# Patient Record
Sex: Female | Born: 1960 | Race: Black or African American | Hispanic: No | Marital: Single | Smoking: Never smoker
Health system: Southern US, Community
[De-identification: ages and names within clinical notes are randomized; demographics above are authoritative.]

## PROBLEM LIST (undated history)

## (undated) DIAGNOSIS — T7840XA Allergy, unspecified, initial encounter: Secondary | ICD-10-CM

## (undated) DIAGNOSIS — D693 Immune thrombocytopenic purpura: Secondary | ICD-10-CM

## (undated) DIAGNOSIS — I10 Essential (primary) hypertension: Secondary | ICD-10-CM

## (undated) DIAGNOSIS — E049 Nontoxic goiter, unspecified: Secondary | ICD-10-CM

## (undated) DIAGNOSIS — E119 Type 2 diabetes mellitus without complications: Secondary | ICD-10-CM

## (undated) DIAGNOSIS — E039 Hypothyroidism, unspecified: Secondary | ICD-10-CM

## (undated) HISTORY — DX: Allergy, unspecified, initial encounter: T78.40XA

## (undated) HISTORY — PX: OTHER SURGICAL HISTORY: SHX169

## (undated) HISTORY — DX: Nontoxic goiter, unspecified: E04.9

## (undated) HISTORY — DX: Essential (primary) hypertension: I10

## (undated) HISTORY — DX: Type 2 diabetes mellitus without complications: E11.9

## (undated) HISTORY — DX: Immune thrombocytopenic purpura: D69.3

---

## 2001-05-23 ENCOUNTER — Other Ambulatory Visit: Admission: RE | Admit: 2001-05-23 | Discharge: 2001-05-23 | Payer: Self-pay | Admitting: Obstetrics and Gynecology

## 2001-09-09 ENCOUNTER — Encounter: Payer: Self-pay | Admitting: Family Medicine

## 2001-09-09 ENCOUNTER — Encounter: Admission: RE | Admit: 2001-09-09 | Discharge: 2001-09-09 | Payer: Self-pay | Admitting: Family Medicine

## 2001-10-15 ENCOUNTER — Ambulatory Visit (HOSPITAL_COMMUNITY): Admission: RE | Admit: 2001-10-15 | Discharge: 2001-10-15 | Payer: Self-pay | Admitting: Family Medicine

## 2001-10-15 ENCOUNTER — Encounter: Payer: Self-pay | Admitting: Family Medicine

## 2001-10-24 ENCOUNTER — Encounter: Payer: Self-pay | Admitting: Family Medicine

## 2001-10-24 ENCOUNTER — Encounter (INDEPENDENT_AMBULATORY_CARE_PROVIDER_SITE_OTHER): Payer: Self-pay | Admitting: Specialist

## 2001-10-24 ENCOUNTER — Encounter (INDEPENDENT_AMBULATORY_CARE_PROVIDER_SITE_OTHER): Payer: Self-pay | Admitting: *Deleted

## 2001-10-24 ENCOUNTER — Ambulatory Visit (HOSPITAL_COMMUNITY): Admission: RE | Admit: 2001-10-24 | Discharge: 2001-10-24 | Payer: Self-pay | Admitting: Family Medicine

## 2002-06-27 ENCOUNTER — Other Ambulatory Visit: Admission: RE | Admit: 2002-06-27 | Discharge: 2002-06-27 | Payer: Self-pay | Admitting: Obstetrics and Gynecology

## 2003-07-28 ENCOUNTER — Other Ambulatory Visit: Admission: RE | Admit: 2003-07-28 | Discharge: 2003-07-28 | Payer: Self-pay | Admitting: Obstetrics and Gynecology

## 2003-09-17 ENCOUNTER — Encounter: Admission: RE | Admit: 2003-09-17 | Discharge: 2003-09-17 | Payer: Self-pay | Admitting: Family Medicine

## 2005-10-26 ENCOUNTER — Encounter: Admission: RE | Admit: 2005-10-26 | Discharge: 2005-10-26 | Payer: Self-pay | Admitting: Rheumatology

## 2005-11-10 ENCOUNTER — Ambulatory Visit: Payer: Self-pay | Admitting: Family Medicine

## 2005-11-24 ENCOUNTER — Ambulatory Visit: Payer: Self-pay | Admitting: Family Medicine

## 2006-01-16 ENCOUNTER — Ambulatory Visit: Payer: Self-pay | Admitting: Family Medicine

## 2006-04-16 ENCOUNTER — Ambulatory Visit: Payer: Self-pay | Admitting: Family Medicine

## 2006-04-16 ENCOUNTER — Encounter: Payer: Self-pay | Admitting: Family Medicine

## 2006-04-16 ENCOUNTER — Other Ambulatory Visit: Admission: RE | Admit: 2006-04-16 | Discharge: 2006-04-16 | Payer: Self-pay | Admitting: Family Medicine

## 2006-06-19 ENCOUNTER — Ambulatory Visit: Payer: Self-pay | Admitting: Family Medicine

## 2007-04-17 ENCOUNTER — Ambulatory Visit: Payer: Self-pay | Admitting: Family Medicine

## 2007-04-17 LAB — CONVERTED CEMR LAB
ALT: 24 units/L (ref 0–35)
Alkaline Phosphatase: 73 units/L (ref 39–117)
BUN: 9 mg/dL (ref 6–23)
Calcium: 9.4 mg/dL (ref 8.4–10.5)
Cholesterol: 148 mg/dL (ref 0–200)
Eosinophils Absolute: 0.2 10*3/uL (ref 0.0–0.6)
GFR calc Af Amer: 77 mL/min
GFR calc non Af Amer: 63 mL/min
HDL: 24.5 mg/dL — ABNORMAL LOW (ref >39.0)
Ketones, urine, test strip: NEGATIVE
Lymphocytes Relative: 39.4 % (ref 12.0–46.0)
MCV: 76.7 fL — ABNORMAL LOW (ref 78.0–100.0)
Monocytes Relative: 8.6 % (ref 3.0–11.0)
Neutro Abs: 2.9 10*3/uL (ref 1.4–7.7)
Platelets: 290 10*3/uL (ref 150–400)
Specific Gravity, Urine: >=1.03
Urobilinogen, UA: 0.2
VLDL: 42 mg/dL — ABNORMAL HIGH (ref 0–40)

## 2007-04-24 ENCOUNTER — Encounter: Payer: Self-pay | Admitting: Family Medicine

## 2007-04-24 ENCOUNTER — Other Ambulatory Visit: Admission: RE | Admit: 2007-04-24 | Discharge: 2007-04-24 | Payer: Self-pay | Admitting: Family Medicine

## 2007-04-24 ENCOUNTER — Ambulatory Visit: Payer: Self-pay | Admitting: Family Medicine

## 2007-04-24 DIAGNOSIS — J309 Allergic rhinitis, unspecified: Secondary | ICD-10-CM | POA: Insufficient documentation

## 2007-04-24 DIAGNOSIS — E049 Nontoxic goiter, unspecified: Secondary | ICD-10-CM

## 2007-04-24 DIAGNOSIS — H209 Unspecified iridocyclitis: Secondary | ICD-10-CM | POA: Insufficient documentation

## 2007-04-24 DIAGNOSIS — E669 Obesity, unspecified: Secondary | ICD-10-CM

## 2007-04-24 DIAGNOSIS — Q828 Other specified congenital malformations of skin: Secondary | ICD-10-CM

## 2007-04-26 ENCOUNTER — Encounter: Admission: RE | Admit: 2007-04-26 | Discharge: 2007-04-26 | Payer: Self-pay | Admitting: Family Medicine

## 2007-04-29 ENCOUNTER — Telehealth (INDEPENDENT_AMBULATORY_CARE_PROVIDER_SITE_OTHER): Payer: Self-pay | Admitting: *Deleted

## 2007-05-03 ENCOUNTER — Encounter: Admission: RE | Admit: 2007-05-03 | Discharge: 2007-05-03 | Payer: Self-pay | Admitting: Family Medicine

## 2007-05-03 ENCOUNTER — Other Ambulatory Visit: Admission: RE | Admit: 2007-05-03 | Discharge: 2007-05-03 | Payer: Self-pay | Admitting: Interventional Radiology

## 2007-05-03 ENCOUNTER — Encounter (INDEPENDENT_AMBULATORY_CARE_PROVIDER_SITE_OTHER): Payer: Self-pay | Admitting: Interventional Radiology

## 2007-05-03 ENCOUNTER — Encounter: Payer: Self-pay | Admitting: Family Medicine

## 2007-05-09 ENCOUNTER — Telehealth: Payer: Self-pay | Admitting: Family Medicine

## 2007-05-15 ENCOUNTER — Telehealth (INDEPENDENT_AMBULATORY_CARE_PROVIDER_SITE_OTHER): Payer: Self-pay

## 2007-11-05 ENCOUNTER — Encounter: Admission: RE | Admit: 2007-11-05 | Discharge: 2007-11-05 | Payer: Self-pay | Admitting: Family Medicine

## 2007-11-13 ENCOUNTER — Encounter: Admission: RE | Admit: 2007-11-13 | Discharge: 2007-11-13 | Payer: Self-pay | Admitting: Family Medicine

## 2008-04-15 ENCOUNTER — Ambulatory Visit: Payer: Self-pay | Admitting: Family Medicine

## 2008-04-15 LAB — CONVERTED CEMR LAB
Albumin: 3.6 g/dL (ref 3.5–5.2)
Basophils Absolute: 0.1 10*3/uL (ref 0.0–0.1)
Basophils Relative: 0.7 % (ref 0.0–3.0)
Bilirubin Urine: NEGATIVE
Calcium: 9.1 mg/dL (ref 8.4–10.5)
Chloride: 106 meq/L (ref 96–112)
Cholesterol: 164 mg/dL (ref 0–200)
Creatinine, Ser: 1 mg/dL (ref 0.4–1.2)
Eosinophils Absolute: 0.1 10*3/uL (ref 0.0–0.7)
GFR calc Af Amer: 76 mL/min
GFR calc non Af Amer: 63 mL/min
Glucose, Urine, Semiquant: NEGATIVE
HCT: 41.4 % (ref 36.0–46.0)
MCHC: 32.6 g/dL (ref 30.0–36.0)
MCV: 75.2 fL — ABNORMAL LOW (ref 78.0–100.0)
Monocytes Absolute: 0.5 10*3/uL (ref 0.1–1.0)
Neutro Abs: 3.9 10*3/uL (ref 1.4–7.7)
Neutrophils Relative %: 53.6 % (ref 43.0–77.0)
Protein, U semiquant: NEGATIVE
RBC: 5.51 M/uL — ABNORMAL HIGH (ref 3.87–5.11)
TSH: 0.8 microintl units/mL (ref 0.35–5.50)
Total Bilirubin: 0.7 mg/dL (ref 0.3–1.2)
VLDL: 21 mg/dL (ref 0–40)
pH: 5.5

## 2008-04-27 ENCOUNTER — Other Ambulatory Visit: Admission: RE | Admit: 2008-04-27 | Discharge: 2008-04-27 | Payer: Self-pay | Admitting: Family Medicine

## 2008-04-27 ENCOUNTER — Encounter: Payer: Self-pay | Admitting: Family Medicine

## 2008-04-27 ENCOUNTER — Ambulatory Visit: Payer: Self-pay | Admitting: Family Medicine

## 2008-04-27 DIAGNOSIS — I10 Essential (primary) hypertension: Secondary | ICD-10-CM

## 2008-04-30 ENCOUNTER — Telehealth: Payer: Self-pay | Admitting: Family Medicine

## 2008-05-14 ENCOUNTER — Telehealth: Payer: Self-pay | Admitting: *Deleted

## 2008-06-01 ENCOUNTER — Encounter: Admission: RE | Admit: 2008-06-01 | Discharge: 2008-06-01 | Payer: Self-pay | Admitting: Family Medicine

## 2008-06-02 ENCOUNTER — Ambulatory Visit: Payer: Self-pay | Admitting: Family Medicine

## 2008-06-02 DIAGNOSIS — J069 Acute upper respiratory infection, unspecified: Secondary | ICD-10-CM | POA: Insufficient documentation

## 2008-06-02 DIAGNOSIS — J45909 Unspecified asthma, uncomplicated: Secondary | ICD-10-CM | POA: Insufficient documentation

## 2008-12-03 ENCOUNTER — Encounter: Admission: RE | Admit: 2008-12-03 | Discharge: 2008-12-03 | Payer: Self-pay | Admitting: Family Medicine

## 2009-04-22 ENCOUNTER — Ambulatory Visit: Payer: Self-pay | Admitting: Family Medicine

## 2009-04-22 LAB — CONVERTED CEMR LAB
Albumin: 3.6 g/dL (ref 3.5–5.2)
Alkaline Phosphatase: 53 units/L (ref 39–117)
Basophils Absolute: 0 10*3/uL (ref 0.0–0.1)
Basophils Relative: 0.4 % (ref 0.0–3.0)
Bilirubin Urine: NEGATIVE
CO2: 30 meq/L (ref 19–32)
Calcium: 9.3 mg/dL (ref 8.4–10.5)
Chloride: 109 meq/L (ref 96–112)
Creatinine, Ser: 0.9 mg/dL (ref 0.4–1.2)
Eosinophils Absolute: 0.1 10*3/uL (ref 0.0–0.7)
Glucose, Bld: 118 mg/dL — ABNORMAL HIGH (ref 70–99)
Glucose, Urine, Semiquant: NEGATIVE
HDL: 45 mg/dL (ref >39.00)
Hemoglobin: 13.3 g/dL (ref 12.0–15.0)
Ketones, urine, test strip: NEGATIVE
Lymphocytes Relative: 39.2 % (ref 12.0–46.0)
MCHC: 32.9 g/dL (ref 30.0–36.0)
MCV: 75 fL — ABNORMAL LOW (ref 78.0–100.0)
Monocytes Absolute: 0.5 10*3/uL (ref 0.1–1.0)
Neutro Abs: 3.7 10*3/uL (ref 1.4–7.7)
Neutrophils Relative %: 52.2 % (ref 43.0–77.0)
RDW: 14 % (ref 11.5–14.6)
Specific Gravity, Urine: 1.02
TSH: 0.61 microintl units/mL (ref 0.35–5.50)
Total Protein: 7.1 g/dL (ref 6.0–8.3)
Triglycerides: 106 mg/dL (ref 0.0–149.0)
pH: 5

## 2009-04-29 ENCOUNTER — Encounter: Payer: Self-pay | Admitting: Family Medicine

## 2009-04-30 ENCOUNTER — Ambulatory Visit: Payer: Self-pay | Admitting: Family Medicine

## 2009-04-30 ENCOUNTER — Other Ambulatory Visit: Admission: RE | Admit: 2009-04-30 | Discharge: 2009-04-30 | Payer: Self-pay | Admitting: Family Medicine

## 2010-01-10 ENCOUNTER — Encounter: Admission: RE | Admit: 2010-01-10 | Discharge: 2010-01-10 | Payer: Self-pay | Admitting: Family Medicine

## 2010-01-10 LAB — HM MAMMOGRAPHY

## 2010-04-26 ENCOUNTER — Ambulatory Visit
Admission: RE | Admit: 2010-04-26 | Discharge: 2010-04-26 | Payer: Self-pay | Source: Home / Self Care | Attending: Family Medicine | Admitting: Family Medicine

## 2010-04-26 LAB — CONVERTED CEMR LAB
ALT: 22 units/L (ref 0–35)
BUN: 8 mg/dL (ref 6–23)
Bilirubin Urine: NEGATIVE
CO2: 28 meq/L (ref 19–32)
Chloride: 102 meq/L (ref 96–112)
Cholesterol: 179 mg/dL (ref 0–200)
Creatinine, Ser: 0.8 mg/dL (ref 0.4–1.2)
Eosinophils Absolute: 0.1 10*3/uL (ref 0.0–0.7)
Eosinophils Relative: 1.8 % (ref 0.0–5.0)
Glucose, Bld: 105 mg/dL — ABNORMAL HIGH (ref 70–99)
Glucose, Urine, Semiquant: NEGATIVE
HCT: 42.1 % (ref 36.0–46.0)
LDL Cholesterol: 117 mg/dL — ABNORMAL HIGH (ref 0–99)
Lymphs Abs: 2.7 10*3/uL (ref 0.7–4.0)
MCHC: 32.6 g/dL (ref 30.0–36.0)
MCV: 74.5 fL — ABNORMAL LOW (ref 78.0–100.0)
Monocytes Absolute: 0.5 10*3/uL (ref 0.1–1.0)
Neutrophils Relative %: 48.1 % (ref 43.0–77.0)
Platelets: 277 10*3/uL (ref 150.0–400.0)
Potassium: 3.9 meq/L (ref 3.5–5.1)
Specific Gravity, Urine: >=1.03
TSH: 0.38 microintl units/mL (ref 0.35–5.50)
Total Bilirubin: 0.8 mg/dL (ref 0.3–1.2)
Total Protein: 7.1 g/dL (ref 6.0–8.3)
Triglycerides: 126 mg/dL (ref 0.0–149.0)
Urobilinogen, UA: 0.2
WBC: 6.5 10*3/uL (ref 4.5–10.5)
pH: 5.5

## 2010-05-03 ENCOUNTER — Ambulatory Visit
Admission: RE | Admit: 2010-05-03 | Discharge: 2010-05-03 | Payer: Self-pay | Source: Home / Self Care | Attending: Family Medicine | Admitting: Family Medicine

## 2010-05-03 ENCOUNTER — Encounter: Payer: Self-pay | Admitting: Family Medicine

## 2010-05-15 ENCOUNTER — Encounter: Payer: Self-pay | Admitting: Family Medicine

## 2010-05-17 ENCOUNTER — Other Ambulatory Visit: Payer: Self-pay | Admitting: Family Medicine

## 2010-05-17 ENCOUNTER — Other Ambulatory Visit
Admission: RE | Admit: 2010-05-17 | Discharge: 2010-05-17 | Payer: Self-pay | Source: Home / Self Care | Admitting: Family Medicine

## 2010-05-17 ENCOUNTER — Ambulatory Visit
Admission: RE | Admit: 2010-05-17 | Discharge: 2010-05-17 | Payer: Self-pay | Source: Home / Self Care | Attending: Family Medicine | Admitting: Family Medicine

## 2010-05-17 LAB — HM PAP SMEAR

## 2010-05-24 NOTE — Assessment & Plan Note (Signed)
Summary: cpx/cjr   Vital Signs:  Patient profile:   50 year old female Menstrual status:  regular LMP:     04/15/2009 Height:      65.5 inches Weight:      187 pounds BMI:     30.76 Temp:     99.3 degrees F oral BP sitting:   120 / 84  (left arm) Cuff size:   regular  Vitals Entered By: Kern Reap CMA Duncan Dull) (April 30, 2009 8:30 AM)  Reason for Visit cpx  History of Present Illness: ruwayda is a 50 year old female, who recently moved back in with her parents, who comes in today for physical evaluation.  She has a history of underlying hypothyroidism, on Synthroid 50 micrograms daily.  TSH level normal.  She takes Tenoretic 50 -- 25 dose one half tab daily BP 120/84.  Also takes an 81-mg aspirin tablet daily.  For birth control.  She is on BCPs.  No side effects.  She gets routine eye care.  Dental care does BSE monthly and gets annual mammography.  Last year.  We noted a cystic lesion in her right breast at the 11 o'clock position.  Biopsy was negative.  She is on a q.6 month recall.  She has a history of underlying allergic rhinitis, currently quiet.  She also has a history of asthma.  She's had a cough now for 3 weeks.  She thinks it was triggered by her viral infection.  No fever no sputum production.  Tetanus booster 2003 seasonal flu shot today  Allergies: No Known Drug Allergies  Past History:  Past medical, surgical, family and social histories (including risk factors) reviewed, and no changes noted (except as noted below).  Past Medical History: Reviewed history from 04/27/2008 and no changes required. Allergic rhinitis dry skin goiter ar iritis childbirth x 1 Hypertension  Past Surgical History: Reviewed history from 04/24/2007 and no changes required. cb x 1  Family History: Reviewed history from 04/27/2008 and no changes required. Family History Hypertension  Social History: Reviewed history from 04/24/2007 and no changes  required. Occupation: Single Never Smoked Alcohol use-no Drug use-no Regular exercise-yes  Review of Systems      See HPI       Flu Vaccine Consent Questions     Do you have a history of severe allergic reactions to this vaccine? no    Any prior history of allergic reactions to egg and/or gelatin? no    Do you have a sensitivity to the preservative Thimersol? no    Do you have a past history of Guillan-Barre Syndrome? no    Do you currently have an acute febrile illness? no    Have you ever had a severe reaction to latex? no    Vaccine information given and explained to patient? yes    Are you currently pregnant? no    Lot Number:[id number]   Exp Date:10/21/2009   Site Given  Right  Deltoid IM   Physical Exam  General:  Well-developed,well-nourished,in no acute distress; alert,appropriate and cooperative throughout examination Head:  Normocephalic and atraumatic without obvious abnormalities. No apparent alopecia or balding. Eyes:  No corneal or conjunctival inflammation noted. EOMI. Perrla. Funduscopic exam benign, without hemorrhages, exudates or papilledema. Vision grossly normal. Ears:  External ear exam shows no significant lesions or deformities.  Otoscopic examination reveals clear canals, tympanic membranes are intact bilaterally without bulging, retraction, inflammation or discharge. Hearing is grossly normal bilaterally. Nose:  External nasal examination shows no deformity or  inflammation. Nasal mucosa are pink and moist without lesions or exudates. Mouth:  Oral mucosa and oropharynx without lesions or exudates.  Teeth in good repair. Neck:  No deformities, masses, or tenderness noted. Chest Wall:  No deformities, masses, or tenderness noted. Breasts:  the left breast is normal.  Right breast at the 11 o'clock position approximately 2 cm from the nipple is a soft movable cystic type lesion Lungs:  Normal respiratory effort, chest expands symmetrically. Lungs are clear to  auscultation, no crackles or wheezes. Heart:  Normal rate and regular rhythm. S1 and S2 normal without gallop, murmur, click, rub or other extra sounds. Abdomen:  Bowel sounds positive,abdomen soft and non-tender without masses, organomegaly or hernias noted. Rectal:  No external abnormalities noted. Normal sphincter tone. No rectal masses or tenderness. Genitalia:  Pelvic Exam:        External: normal female genitalia without lesions or masses        Vagina: normal without lesions or masses        Cervix: normal without lesions or masses        Adnexa: normal bimanual exam without masses or fullness        Uterus: normal by palpation        Pap smear: performed Msk:  No deformity or scoliosis noted of thoracic or lumbar spine.   Pulses:  R and L carotid,radial,femoral,dorsalis pedis and posterior tibial pulses are full and equal bilaterally Extremities:  No clubbing, cyanosis, edema, or deformity noted with normal full range of motion of all joints.   Neurologic:  No cranial nerve deficits noted. Station and gait are normal. Plantar reflexes are down-going bilaterally. DTRs are symmetrical throughout. Sensory, motor and coordinative functions appear intact. Skin:  Intact without suspicious lesions or rashes Cervical Nodes:  No lymphadenopathy noted Axillary Nodes:  No palpable lymphadenopathy Inguinal Nodes:  No significant adenopathy Psych:  Cognition and judgment appear intact. Alert and cooperative with normal attention span and concentration. No apparent delusions, illusions, hallucinations   Impression & Recommendations:  Problem # 1:  HYPERTENSION (ICD-401.9) Assessment Improved  Her updated medication list for this problem includes:    Tenoretic 50 50-25 Mg Tabs (Atenolol-chlorthalidone) .Marland Kitchen... 1/2 tab once daily  Orders: EKG w/ Interpretation (93000) Prescription Created Electronically [phone])  Problem # 2:  OBESITY (ICD-278.00) Assessment:  Unchanged  Orders: Prescription Created Electronically [phone])  Problem # 3:  GOITER, UNSPECIFIED (ICD-240.9) Assessment: Unchanged  Orders: Prescription Created Electronically [phone])  Problem # 4:  ALLERGIC RHINITIS (ICD-477.9) Assessment: Unchanged  Complete Medication List: 1)  Levothyroxine Sodium 50 Mcg Tabs (Levothyroxine sodium) .... Take 1 tablet by mouth once a day 2)  Tenoretic 50 50-25 Mg Tabs (Atenolol-chlorthalidone) .... 1/2 tab once daily 3)  Aspirin Ec 81 Mg Tbec (aspirin)  4)  Zovia 1/35e (28) 1-35 Mg-mcg Tabs (Ethynodiol diac-eth estradiol) .... Uad 5)  Metronidazole 500 Mg Tabs (Metronidazole) .... Take 1 tablet by mouth two times a day 6)  Prednisone 20 Mg Tabs (Prednisone) .... Uad 7)  Hydromet 5-1.5 Mg/58ml Syrp (Hydrocodone-homatropine) .Marland Kitchen.. 1 or 2 tsps three times a day as needed  Other Orders: Admin 1st Vaccine (91478) Flu Vaccine 3yrs + (29562)  Patient Instructions: 1)  begin prednisone, take two tabs x 3 days, one x 3 days, a half x 3 days, then half a tablet Monday, Wednesday, Friday, for a two week taper. 2)  Please schedule a follow-up appointment in 1 year. 3)  It is important that you exercise regularly at least  20 minutes 5 times a week. If you develop chest pain, have severe difficulty breathing, or feel very tired , stop exercising immediately and seek medical attention. 4)  You need to lose weight. Consider a lower calorie diet and regular exercise.  5)  Schedule your mammogram. 6)  Take an Aspirin every day. Prescriptions: PREDNISONE 20 MG TABS (PREDNISONE) UAD  #30 x 0   Entered and Authorized by:   Roderick Pee MD   Signed by:   Roderick Pee MD on 04/30/2009   Method used:   Electronically to        [facility] [address] [phone]* (retail)       [address]       [place], [place]  [address]       Ph: [phone] or [phone]       Fax: [phone]   RxID:   [id number] ZOVIA 1/35E (28) 1-35 MG-MCG TABS  (ETHYNODIOL DIAC-ETH ESTRADIOL) UAD  #3 paks x 3   Entered and Authorized by:   Roderick Pee MD   Signed by:   Roderick Pee MD on 04/30/2009   Method used:   Electronically to        [facility] [address] [phone]* (retail)       [address]       [place], [place]  [address]       Ph: [phone] or [phone]       Fax: [phone]   RxID:   [id number] TENORETIC 50 50-25 MG  TABS (ATENOLOL-CHLORTHALIDONE) 1/2 tab once daily  #50 x 4   Entered and Authorized by:   Roderick Pee MD   Signed by:   Roderick Pee MD on 04/30/2009   Method used:   Electronically to        [facility] [address] [phone]* (retail)       [address]       [place], [place]  [address]       Ph: [phone] or [phone]       Fax: [phone]   RxID:   [id number] LEVOTHYROXINE SODIUM 50 MCG  TABS (LEVOTHYROXINE SODIUM) Take 1 tablet by mouth once a day  #100 x 4   Entered and Authorized by:   Roderick Pee MD   Signed by:   Roderick Pee MD on 04/30/2009   Method used:   Electronically to        [facility] [address] [phone]* (retail)       [address]       [place], [place]  [address]       Ph: [phone] or [phone]       Fax: [phone]   RxID:   [id number]

## 2010-05-26 NOTE — Assessment & Plan Note (Signed)
Summary: cpx/pap/njr   Vital Signs:  Patient profile:   50 year old female Menstrual status:  regular Height:      65.5 inches Weight:      189 pounds BMI:     31.08 Temp:     98.5 degrees F oral BP sitting:   118 / 80  (left arm) Cuff size:   regular  Vitals Entered By: Kern Reap CMA Duncan Dull) (May 03, 2010 8:27 AM)  History of Present Illness: Autumn Acevedo is a 50 year old female, nonsmoker, who comes in today for general physical examination because of history of underlying hypertension dysfunctional uterine bleeding and a symmetrical goiter.  She takes Synthroid 50 micrograms daily for the goiter.  She says is unchanged in size.  She is asymptomatic and does not wish to pursue surgery.  She takes Tenoretic 50 -- 25 dose one half tab q.a.m., BP 118/80.  He takes her BCPs.  LMP started today.  Will get her back in two weeks for Pap.  She also takes an aspirin tablet daily.  Review of systems negative except as cough for 2 1/2 months.  She has a history of underlying allergic rhinitis and asthma.  Her back in January of 2011 we had to give her a short course of prednisone for the same problem.  She gets routine eye care, dental care, TSE monthly, mammogram in August.  Normal, tetanus, 2003, seasonal flu 2011  Allergies (verified): No Known Drug Allergies  Past History:  Past medical, surgical, family and social histories (including risk factors) reviewed, and no changes noted (except as noted below).  Past Medical History: Reviewed history from 04/27/2008 and no changes required. Allergic rhinitis dry skin goiter ar iritis childbirth x 1 Hypertension  Past Surgical History: Reviewed history from 04/24/2007 and no changes required. cb x 1  Family History: Reviewed history from 04/27/2008 and no changes required. Family History Hypertension  Social History: Reviewed history from 04/24/2007 and no changes required. Occupation: Single Never Smoked Alcohol  use-no Drug use-no Regular exercise-yes  Review of Systems      See HPI  Physical Exam  General:  Well-developed,well-nourished,in no acute distress; alert,appropriate and cooperative throughout examination Head:  Normocephalic and atraumatic without obvious abnormalities. No apparent alopecia or balding. Eyes:  No corneal or conjunctival inflammation noted. EOMI. Perrla. Funduscopic exam benign, without hemorrhages, exudates or papilledema. Vision grossly normal. Ears:  External ear exam shows no significant lesions or deformities.  Otoscopic examination reveals clear canals, tympanic membranes are intact bilaterally without bulging, retraction, inflammation or discharge. Hearing is grossly normal bilaterally. Nose:  External nasal examination shows no deformity or inflammation. Nasal mucosa are pink and moist without lesions or exudates. Mouth:  Oral mucosa and oropharynx without lesions or exudates.  Teeth in good repair. Neck:  the thyroid gland is about 4 times normal size and symmetrical Chest Wall:  No deformities, masses, or tenderness noted. Breasts:  No mass, nodules, thickening, tenderness, bulging, retraction, inflamation, nipple discharge or skin changes noted.   Lungs:  Normal respiratory effort, chest expands symmetrically. Lungs are clear to auscultation, no crackles or wheezes. Heart:  Normal rate and regular rhythm. S1 and S2 normal without gallop, murmur, click, rub or other extra sounds. Abdomen:  Bowel sounds positive,abdomen soft and non-tender without masses, organomegaly or hernias noted. Msk:  No deformity or scoliosis noted of thoracic or lumbar spine.   Pulses:  R and L carotid,radial,femoral,dorsalis pedis and posterior tibial pulses are full and equal bilaterally Extremities:  No clubbing,  cyanosis, edema, or deformity noted with normal full range of motion of all joints.   Neurologic:  No cranial nerve deficits noted. Station and gait are normal. Plantar reflexes  are down-going bilaterally. DTRs are symmetrical throughout. Sensory, motor and coordinative functions appear intact. Skin:  Intact without suspicious lesions or rashes Cervical Nodes:  No lymphadenopathy noted Axillary Nodes:  No palpable lymphadenopathy Inguinal Nodes:  No significant adenopathy Psych:  Cognition and judgment appear intact. Alert and cooperative with normal attention span and concentration. No apparent delusions, illusions, hallucinations   Impression & Recommendations:  Problem # 1:  INTRINSIC ASTHMA, UNSPECIFIED (ICD-493.10) Assessment Deteriorated  The following medications were removed from the medication list:    Prednisone 20 Mg Tabs (Prednisone) ..... Uad Her updated medication list for this problem includes:    Prednisone 20 Mg Tabs (Prednisone) ..... Uad  Orders: Prescription Created Electronically [id number])  Problem # 2:  HYPERTENSION (ICD-401.9) Assessment: Improved  Her updated medication list for this problem includes:    Tenoretic 50 50-25 Mg Tabs (Atenolol-chlorthalidone) .Marland Kitchen... 1/2 tab once daily  Orders: Prescription Created Electronically [phone]) EKG w/ Interpretation (93000)  Problem # 3:  DRY SKIN (ICD-757.39) Assessment: Deteriorated  Orders: Prescription Created Electronically [phone])  Problem # 4:  GOITER, UNSPECIFIED (ICD-240.9) Assessment: Unchanged  Orders: Prescription Created Electronically [phone])  Problem # 5:  HEALTH SCREENING (ICD-V70.0) Assessment: Unchanged  Orders: Prescription Created Electronically [phone]) EKG w/ Interpretation (93000)  Complete Medication List: 1)  Levothyroxine Sodium 50 Mcg Tabs (Levothyroxine sodium) .... Take 1 tablet by mouth once a day 2)  Tenoretic 50 50-25 Mg Tabs (Atenolol-chlorthalidone) .... 1/2 tab once daily 3)  Aspirin Ec 81 Mg Tbec (aspirin)  4)  Zovia 1/35e (28) 1-35 Mg-mcg Tabs (Ethynodiol diac-eth estradiol) .... Uad 5)  Prednisone 20 Mg Tabs (Prednisone) .... Uad  Patient  Instructions: 1)  continue current medications. 2)  Return in two weeks for a Pap smear. 3)  Begin prednisone two tabs daily until you feel well, then taper by taking one tab daily for 3 days, a half a tablet a day for 3 days, then half a tablet Monday, Wednesday, Friday, for a two week taper Prescriptions: ZOVIA 1/35E (28) 1-35 MG-MCG TABS (ETHYNODIOL DIAC-ETH ESTRADIOL) UAD  #84.0 Each x 2   Entered and Authorized by:   Roderick Pee MD   Signed by:   Roderick Pee MD on 05/03/2010   Method used:   Print then Give to Patient   RxID:   [id number] TENORETIC 50 50-25 MG  TABS (ATENOLOL-CHLORTHALIDONE) 1/2 tab once daily  #50.0 Each x 3   Entered and Authorized by:   Roderick Pee MD   Signed by:   Roderick Pee MD on 05/03/2010   Method used:   Print then Give to Patient   RxID:   [id number] LEVOTHYROXINE SODIUM 50 MCG  TABS (LEVOTHYROXINE SODIUM) Take 1 tablet by mouth once a day  #100.0 Each x 3   Entered and Authorized by:   Roderick Pee MD   Signed by:   Roderick Pee MD on 05/03/2010   Method used:   Print then Give to Patient   RxID:   [id number] ZOVIA 1/35E (28) 1-35 MG-MCG TABS (ETHYNODIOL DIAC-ETH ESTRADIOL) UAD  #84.0 Each x 2   Entered and Authorized by:   Roderick Pee MD   Signed by:   Roderick Pee MD on 05/03/2010   Method used:   Electronically to  [facility] [address] [phone]* (retail)       [address]       [place], [place]  [address]       Ph: [phone] or [phone]       Fax: [phone]   RxID:   [id number] TENORETIC 50 50-25 MG  TABS (ATENOLOL-CHLORTHALIDONE) 1/2 tab once daily  #50.0 Each x 3   Entered and Authorized by:   Roderick Pee MD   Signed by:   Roderick Pee MD on 05/03/2010   Method used:   Electronically to        [facility] [address] [phone]* (retail)       [address]       [place], [place]  [address]       Ph: [phone] or [phone]       Fax:  [phone]   RxID:   [id number] LEVOTHYROXINE SODIUM 50 MCG  TABS (LEVOTHYROXINE SODIUM) Take 1 tablet by mouth once a day  #100.0 Each x 3   Entered and Authorized by:   Roderick Pee MD   Signed by:   Roderick Pee MD on 05/03/2010   Method used:   Electronically to        [facility] [address] [phone]* (retail)       [address]       [place], [place]  [address]       Ph: [phone] or [phone]       Fax: [phone]   RxID:   [id number] PREDNISONE 20 MG TABS (PREDNISONE) UAD  #50 x 1   Entered and Authorized by:   Roderick Pee MD   Signed by:   Roderick Pee MD on 05/03/2010   Method used:   Electronically to        [facility] [address] [phone]* (retail)       [address]       [place], [place]  [address]       Ph: [phone] or [phone]       Fax: [phone]   RxID:   [id number]    Orders Added: 1)  Prescription Created Electronically [G8553] 2)  Est. Patient 40-64 years [99396] 3)  EKG w/ Interpretation [93000]

## 2010-05-26 NOTE — Assessment & Plan Note (Signed)
Summary: PAP ONLY/NO CHARGE/MM   History of Present Illness: Autumn Acevedo is a 50 year old female, who comes back today for a Pap smear.  We get her physical examination two weeks ago, however, at the time she was not at mid cycle and therefore, we brought her back for a Pap today  Allergies: No Known Drug Allergies  Physical Exam  Rectal:  No external abnormalities noted. Normal sphincter tone. No rectal masses or tenderness. Genitalia:  Pelvic Exam:        External: normal female genitalia without lesions or masses        Vagina: normal without lesions or masses        Cervix: normal without lesions or masses        Adnexa: normal bimanual exam without masses or fullness        Uterus: normal by palpation        Pap smear: performed   Impression & Recommendations:  Problem # 1:  HEALTH SCREENING (ICD-V70.0) Assessment Unchanged  Orders: No Charge Patient Arrived (NCPA0) (NCPA0)  Complete Medication List: 1)  Levothyroxine Sodium 50 Mcg Tabs (Levothyroxine sodium) .... Take 1 tablet by mouth once a day 2)  Tenoretic 50 50-25 Mg Tabs (Atenolol-chlorthalidone) .... 1/2 tab once daily 3)  Aspirin Ec 81 Mg Tbec (aspirin)  4)  Zovia 1/35e (28) 1-35 Mg-mcg Tabs (Ethynodiol diac-eth estradiol) .... Uad 5)  Prednisone 20 Mg Tabs (Prednisone) .... Uad  Patient Instructions: 1)  Please schedule a follow-up appointment as needed.   Orders Added: 1)  No Charge Patient Arrived (NCPA0) [NCPA0]

## 2010-09-13 ENCOUNTER — Encounter: Payer: Self-pay | Admitting: Internal Medicine

## 2010-09-13 ENCOUNTER — Ambulatory Visit (INDEPENDENT_AMBULATORY_CARE_PROVIDER_SITE_OTHER): Payer: Managed Care, Other (non HMO) | Admitting: Internal Medicine

## 2010-09-13 DIAGNOSIS — J40 Bronchitis, not specified as acute or chronic: Secondary | ICD-10-CM

## 2010-09-13 MED ORDER — DOXYCYCLINE HYCLATE 100 MG PO TABS: 100.0000 mg | ORAL_TABLET | Freq: Two times a day (BID) | ORAL | Status: AC

## 2010-09-13 MED ORDER — CHLORPHENIRAMINE-HYDROCODONE 8-10 MG/5ML PO LQCR: 5.0000 mL | Freq: Two times a day (BID) | ORAL | Status: DC | PRN

## 2010-09-17 ENCOUNTER — Encounter: Payer: Self-pay | Admitting: Internal Medicine

## 2010-09-17 NOTE — Progress Notes (Signed)
  Subjective:    Patient ID: Autumn Acevedo, female    DOB: 08/10/1960, 50 y.o.   MRN: [id number]  HPI Patient presents to clinic for evaluation of cough. Patient notes one and a half week history of laryngitis, cough productive for white to clear sputum worse at night. Denies nasal congestion shortness of breath wheezing fever or chills. No obvious sick exposure. No alleviating or exacerbating factors. No other complaints.  Reviewed past medical history, medications and allergies  Review of Systemssee history of present illness     Objective:   Physical Exam    Physical Exam  Vitals reviewed. Constitutional:  appears well-developed and well-nourished. No distress.  HENT:  Head: Normocephalic and atraumatic.  Right Ear: Tympanic membrane, external ear and ear canal normal.  Left Ear: Tympanic membrane, external ear and ear canal normal.  Nose: Nose normal.  Mouth/Throat: Oropharynx is clear and moist. No oropharyngeal exudate.  Eyes: Conjunctivae and EOM are normal. Pupils are equal, round, and reactive to light. Right eye exhibits no discharge. Left eye exhibits no discharge. No scleral icterus.  Neck: Neck supple. No thyromegaly present.  Cardiovascular: Normal rate, regular rhythm and normal heart sounds.  Exam reveals no gallop and no friction rub.   No murmur heard. Pulmonary/Chest: Effort normal and breath sounds normal. No respiratory distress.  has no wheezes.  has no rales.  Lymphadenopathy:   no cervical adenopathy.  Neurological:  is alert.  Skin: Skin is warm and dry.  not diaphoretic.  Psychiatric: normal mood and affect.      Assessment & Plan:

## 2010-09-17 NOTE — Assessment & Plan Note (Signed)
Begin antibiotic therapy for 10 days. Attempt Tussionex p.r.n. Cough. Caution regarding possible sedating effect.Followup if no improvement or worsening.

## 2010-11-13 ENCOUNTER — Other Ambulatory Visit: Payer: Self-pay | Admitting: Family Medicine

## 2010-11-22 ENCOUNTER — Other Ambulatory Visit: Payer: Self-pay | Admitting: *Deleted

## 2010-11-22 MED ORDER — ETHYNODIOL DIAC-ETH ESTRADIOL 1-35 MG-MCG PO TABS: 1.0000 | ORAL_TABLET | ORAL | Status: DC

## 2010-11-22 MED ORDER — ATENOLOL-CHLORTHALIDONE 50-25 MG PO TABS: ORAL_TABLET | ORAL | Status: DC

## 2010-11-30 ENCOUNTER — Encounter: Payer: Self-pay | Admitting: Family Medicine

## 2010-11-30 ENCOUNTER — Ambulatory Visit (INDEPENDENT_AMBULATORY_CARE_PROVIDER_SITE_OTHER): Payer: Managed Care, Other (non HMO) | Admitting: Family Medicine

## 2010-11-30 DIAGNOSIS — J309 Allergic rhinitis, unspecified: Secondary | ICD-10-CM

## 2010-11-30 DIAGNOSIS — J45909 Unspecified asthma, uncomplicated: Secondary | ICD-10-CM

## 2010-11-30 MED ORDER — PREDNISONE 20 MG PO TABS: ORAL_TABLET | ORAL | Status: AC

## 2010-11-30 NOTE — Progress Notes (Signed)
  Subjective:    Patient ID: Autumn Acevedo, female    DOB: 14-Sep-1960, 50 y.o.   MRN: [id number]  HPI Autumn Acevedo is a 50 year old female, married, nonsmoker comes in with a 3 month history of cough.  She has a history of underlying allergic rhinitis and asthma.  She began coughing in May and was seen here and given antibiotic, however, didn't help.  The cough has persisted.  The entire time.  She had an episode like this a couple years ago.  We gave her a short course of prednisone in the cough went away.  Environmental review of systems negative   Review of Systems General and environmental and allergic review of systems otherwise negative    Objective:   Physical Exam  Well-developed well-nourished, female, in no acute distress.  Examination of the HEENT were negative.  Neck was supple.  No adenopathy.  Thyroid is enlarged.  Per usual.  She has a history of underlying goiter.  Lungs symmetrical breath sounds expiratory wheezing bilaterally     Assessment & Plan:  Asthma plan prednisone burst and taper return

## 2010-11-30 NOTE — Patient Instructions (Signed)
Take the prednisone as directed.  Return p.r.n. 

## 2010-12-08 ENCOUNTER — Ambulatory Visit: Payer: Managed Care, Other (non HMO) | Admitting: Family Medicine

## 2011-01-23 ENCOUNTER — Other Ambulatory Visit: Payer: Self-pay | Admitting: Family Medicine

## 2011-01-23 DIAGNOSIS — Z1231 Encounter for screening mammogram for malignant neoplasm of breast: Secondary | ICD-10-CM

## 2011-01-30 ENCOUNTER — Ambulatory Visit: Payer: Managed Care, Other (non HMO)

## 2011-02-01 ENCOUNTER — Ambulatory Visit
Admission: RE | Admit: 2011-02-01 | Discharge: 2011-02-01 | Disposition: A | Discharge status: Home / Self Care | Payer: Managed Care, Other (non HMO) | Source: Ambulatory Visit | Attending: Family Medicine | Admitting: Family Medicine

## 2011-02-01 DIAGNOSIS — Z1231 Encounter for screening mammogram for malignant neoplasm of breast: Secondary | ICD-10-CM

## 2011-03-15 ENCOUNTER — Other Ambulatory Visit: Payer: Self-pay | Admitting: *Deleted

## 2011-03-15 MED ORDER — ATENOLOL-CHLORTHALIDONE 50-25 MG PO TABS: ORAL_TABLET | ORAL | Status: DC

## 2011-05-19 ENCOUNTER — Other Ambulatory Visit (INDEPENDENT_AMBULATORY_CARE_PROVIDER_SITE_OTHER): Payer: Managed Care, Other (non HMO)

## 2011-05-19 DIAGNOSIS — Z0000 Encounter for general adult medical examination without abnormal findings: Secondary | ICD-10-CM

## 2011-05-19 LAB — CBC WITH DIFFERENTIAL/PLATELET
Eosinophils Relative: 3.1 % (ref 0.0–5.0)
HCT: 41.8 % (ref 36.0–46.0)
Hemoglobin: 13.6 g/dL (ref 12.0–15.0)
Lymphs Abs: 2.7 10*3/uL (ref 0.7–4.0)
MCV: 74 fl — ABNORMAL LOW (ref 78.0–100.0)
Monocytes Absolute: 0.6 10*3/uL (ref 0.1–1.0)
Neutro Abs: 3.8 10*3/uL (ref 1.4–7.7)
Platelets: 271 10*3/uL (ref 150.0–400.0)
RDW: 14.9 % — ABNORMAL HIGH (ref 11.5–14.6)
WBC: 7.3 10*3/uL (ref 4.5–10.5)

## 2011-05-19 LAB — POCT URINALYSIS DIPSTICK
Bilirubin, UA: NEGATIVE
Glucose, UA: NEGATIVE
Ketones, UA: NEGATIVE
Leukocytes, UA: NEGATIVE
Nitrite, UA: NEGATIVE

## 2011-05-19 LAB — BASIC METABOLIC PANEL
BUN: 10 mg/dL (ref 6–23)
Chloride: 103 mEq/L (ref 96–112)
Glucose, Bld: 149 mg/dL — ABNORMAL HIGH (ref 70–99)
Potassium: 4.3 mEq/L (ref 3.5–5.1)
Sodium: 140 mEq/L (ref 135–145)

## 2011-05-19 LAB — LIPID PANEL
HDL: 39.6 mg/dL (ref >39.00)
Total CHOL/HDL Ratio: 4

## 2011-05-19 LAB — TSH: TSH: 0.33 u[IU]/mL — ABNORMAL LOW (ref 0.35–5.50)

## 2011-05-19 LAB — HEPATIC FUNCTION PANEL
ALT: 14 U/L (ref 0–35)
Albumin: 3.9 g/dL (ref 3.5–5.2)
Total Bilirubin: 0.7 mg/dL (ref 0.3–1.2)
Total Protein: 7.2 g/dL (ref 6.0–8.3)

## 2011-05-25 ENCOUNTER — Other Ambulatory Visit: Payer: Self-pay | Admitting: Family Medicine

## 2011-05-30 ENCOUNTER — Other Ambulatory Visit (HOSPITAL_COMMUNITY)
Admission: RE | Admit: 2011-05-30 | Discharge: 2011-05-30 | Disposition: A | Discharge status: Home / Self Care | Payer: Managed Care, Other (non HMO) | Source: Ambulatory Visit | Attending: Family Medicine | Admitting: Family Medicine

## 2011-05-30 ENCOUNTER — Ambulatory Visit (INDEPENDENT_AMBULATORY_CARE_PROVIDER_SITE_OTHER): Payer: Managed Care, Other (non HMO) | Admitting: Family Medicine

## 2011-05-30 ENCOUNTER — Encounter: Payer: Self-pay | Admitting: Family Medicine

## 2011-05-30 DIAGNOSIS — I10 Essential (primary) hypertension: Secondary | ICD-10-CM

## 2011-05-30 DIAGNOSIS — Z23 Encounter for immunization: Secondary | ICD-10-CM

## 2011-05-30 DIAGNOSIS — Z01419 Encounter for gynecological examination (general) (routine) without abnormal findings: Secondary | ICD-10-CM | POA: Insufficient documentation

## 2011-05-30 DIAGNOSIS — Z0000 Encounter for general adult medical examination without abnormal findings: Secondary | ICD-10-CM

## 2011-05-30 DIAGNOSIS — Q828 Other specified congenital malformations of skin: Secondary | ICD-10-CM

## 2011-05-30 DIAGNOSIS — E669 Obesity, unspecified: Secondary | ICD-10-CM

## 2011-05-30 DIAGNOSIS — E049 Nontoxic goiter, unspecified: Secondary | ICD-10-CM

## 2011-05-30 MED ORDER — ATENOLOL-CHLORTHALIDONE 50-25 MG PO TABS: ORAL_TABLET | ORAL | Status: DC

## 2011-05-30 MED ORDER — LEVOTHYROXINE SODIUM 50 MCG PO TABS: 50.0000 ug | ORAL_TABLET | Freq: Every day | ORAL | Status: DC

## 2011-05-30 NOTE — Progress Notes (Signed)
  Subjective:    Patient ID: Autumn Acevedo, female    DOB: Mar 19, 1961, 51 y.o.   MRN: [id number]  HPI Autumn Acevedo is a 51 year old single female nonsmoker who comes in today for general physical examination because of a history of hypertension and a goiter and dysfunctional uterine bleeding  Her hypertension has been treated with Tenoretic dose one half tab daily BP today 160/100 initially repeat by me after exam 150/90  She stopped her BCPs in October is beginning having normal short periods is content to stay off the BCPs. Currently not sexually active.  She has a history of a symmetrical goiter and is on suppression with Synthroid 50 mcg daily. No change in size of her thyroid and no symptoms.  Tetanus booster 2007 seasonal flu shot today   Review of Systems  Constitutional: Negative.   HENT: Negative.   Eyes: Negative.   Respiratory: Negative.   Cardiovascular: Negative.   Gastrointestinal: Negative.   Genitourinary: Negative.   Musculoskeletal: Negative.   Neurological: Negative.   Hematological: Negative.   Psychiatric/Behavioral: Negative.        Objective:   Physical Exam  Constitutional: She appears well-developed and well-nourished.  HENT:  Head: Normocephalic and atraumatic.  Right Ear: External ear normal.  Left Ear: External ear normal.  Nose: Nose normal.  Mouth/Throat: Oropharynx is clear and moist.  Eyes: EOM are normal. Pupils are equal, round, and reactive to light.  Neck: Normal range of motion. Neck supple. No thyromegaly present.  Cardiovascular: Normal rate, regular rhythm, normal heart sounds and intact distal pulses.  Exam reveals no gallop and no friction rub.   No murmur heard. Pulmonary/Chest: Effort normal and breath sounds normal.  Abdominal: Soft. Bowel sounds are normal. She exhibits no distension and no mass. There is no tenderness. There is no rebound.  Genitourinary: Vagina normal and uterus normal. Guaiac negative stool. No vaginal discharge  found.  Musculoskeletal: Normal range of motion.  Lymphadenopathy:    She has no cervical adenopathy.  Neurological: She is alert. She has normal reflexes. No cranial nerve deficit. She exhibits normal muscle tone. Coordination normal.  Skin: Skin is warm and dry.  Psychiatric: She has a normal mood and affect. Her behavior is normal. Judgment and thought content normal.   neck exam shows a diffuse goiter bilaterally nonnodular        Assessment & Plan:  Healthy female  Hypertension not at goal increase Tenoretic to 1 tablet daily BP check daily followup in 3 weeks  Dysfunctional uterine bleeding perimenopausal a dry skin and minimal hot flushes avoid BCPs  Multinodular goiter continue Synthroid suppression 50 mcg daily  Overweight diet exercise and weight loss

## 2011-05-30 NOTE — Patient Instructions (Signed)
Increase E. Tenoretic take one tablet daily and check your blood pressure daily in the morning return in 3 weeks with the data and the device  Continue the Synthroid daily  General physical examination annually

## 2011-06-20 ENCOUNTER — Ambulatory Visit (INDEPENDENT_AMBULATORY_CARE_PROVIDER_SITE_OTHER): Payer: Managed Care, Other (non HMO) | Admitting: Family Medicine

## 2011-06-20 ENCOUNTER — Encounter: Payer: Self-pay | Admitting: Family Medicine

## 2011-06-20 VITALS — BP 112/80

## 2011-06-20 DIAGNOSIS — I10 Essential (primary) hypertension: Secondary | ICD-10-CM

## 2011-06-20 NOTE — Progress Notes (Signed)
  Subjective:    Patient ID: Autumn Acevedo, female    DOB: 04-13-1961, 51 y.o.   MRN: [id number]  HPI Autumn Acevedo is a 51 year old female who comes in today for followup of hypertension  She's now on a full tablet of Tenoretic 50-25 daily BP normal 120/80 no side effects from medication   Review of Systems General and cardiovascular review of systems otherwise negative    Objective:   Physical Exam Well-developed well-nourished female in no acute distress BP right arm sitting position 120/80   Pulse 70 and regular    Assessment & Plan:  Hypertension adult continue current medication BP check weekly return when necessary or for annual exam

## 2011-06-20 NOTE — Patient Instructions (Signed)
Continue your medication daily  Check your blood pressure weekly  Return in one year sooner if any problems

## 2011-08-16 ENCOUNTER — Encounter: Payer: Self-pay | Admitting: Family

## 2011-08-16 ENCOUNTER — Ambulatory Visit (INDEPENDENT_AMBULATORY_CARE_PROVIDER_SITE_OTHER): Payer: Managed Care, Other (non HMO) | Admitting: Family

## 2011-08-16 VITALS — BP 120/84 | Temp 99.0°F | Wt 190.0 lb

## 2011-08-16 DIAGNOSIS — R05 Cough: Secondary | ICD-10-CM

## 2011-08-16 DIAGNOSIS — J209 Acute bronchitis, unspecified: Secondary | ICD-10-CM

## 2011-08-16 MED ORDER — METHYLPREDNISOLONE 4 MG PO KIT: PACK | ORAL | Status: AC

## 2011-08-16 MED ORDER — ALBUTEROL SULFATE HFA 108 (90 BASE) MCG/ACT IN AERS: 2.0000 | INHALATION_SPRAY | Freq: Four times a day (QID) | RESPIRATORY_TRACT | Status: DC | PRN

## 2011-08-16 MED ORDER — HYDROCOD POLST-CHLORPHEN POLST 10-8 MG/5ML PO LQCR: 5.0000 mL | Freq: Two times a day (BID) | ORAL | Status: DC | PRN

## 2011-08-16 NOTE — Patient Instructions (Signed)

## 2011-08-16 NOTE — Progress Notes (Signed)
  Subjective:    Patient ID: Autumn Acevedo, female    DOB: [birth year], 51 y.o.   MRN: [id number]  HPI Comments: 51 yo black female presents with c/o dry/hacking cough x one month. Nonsmoker.  Denies cp, shortness of breath, fever, chills, sorethroat, headaches, sneezing, or other associated s/s. Has not taken any OTC meds. No one in household has been sick.  Cough Pertinent negatives include no shortness of breath or wheezing.      Review of Systems  Constitutional: Negative.   HENT: Negative.   Eyes: Negative.   Respiratory: Positive for cough. Negative for apnea, choking, chest tightness, shortness of breath, wheezing and stridor.   Skin: Negative.    Past Medical History  Diagnosis Date  . Allergy   . Hypertension   . Goiter     History   Social History  . Marital Status: Single    Spouse Name: N/A    Number of Children: N/A  . Years of Education: N/A   Occupational History  . Not on file.   Social History Main Topics  . Smoking status: Never Smoker   . Smokeless tobacco: Not on file  . Alcohol Use: No  . Drug Use: No  . Sexually Active:    Other Topics Concern  . Not on file   Social History Narrative  . No narrative on file    No past surgical history on file.  Family History  Problem Relation Age of Onset  . Hypertension      fhx    No Known Allergies  Current Outpatient Prescriptions on File Prior to Visit  Medication Sig Dispense Refill  . aspirin 81 MG tablet Take 81 mg by mouth daily.        Marland Kitchen atenolol-chlorthalidone (TENORETIC) 50-25 MG per tablet 1 tablet daily in the morning  100 tablet  3  . levothyroxine (SYNTHROID, LEVOTHROID) 50 MCG tablet Take 1 tablet (50 mcg total) by mouth daily.  100 tablet  3    BP 120/84  Temp(Src) 99 F (37.2 C) (Oral)  Wt 190 lb (86.183 kg)chart    Objective:   Physical Exam  Constitutional: She is oriented to person, place, and time. She appears well-developed and well-nourished. No distress.  HENT:   Right Ear: External ear normal.  Left Ear: External ear normal.  Mouth/Throat: Oropharynx is clear and moist. No oropharyngeal exudate.  Cardiovascular: Normal rate, normal heart sounds and intact distal pulses.  Exam reveals no gallop and no friction rub.   No murmur heard. Pulmonary/Chest: Effort normal and breath sounds normal. No respiratory distress. She has no wheezes. She has no rales. She exhibits no tenderness.  Neurological: She is alert and oriented to person, place, and time.  Skin: Skin is warm and dry. She is not diaphoretic.          Assessment & Plan:  Assessment: Bronchitis, Cough  Plan: prednisone, tussinex. Albuterol. RTC if s/s do not resolve or get worse. Teaching handouts on diagnosis and treatment provided.Opportunity for questions provided.

## 2012-01-22 ENCOUNTER — Other Ambulatory Visit: Payer: Self-pay | Admitting: Family Medicine

## 2012-01-22 DIAGNOSIS — Z1231 Encounter for screening mammogram for malignant neoplasm of breast: Secondary | ICD-10-CM

## 2012-02-02 ENCOUNTER — Ambulatory Visit
Admission: RE | Admit: 2012-02-02 | Discharge: 2012-02-02 | Disposition: A | Discharge status: Home / Self Care | Payer: Managed Care, Other (non HMO) | Source: Ambulatory Visit | Attending: Family Medicine | Admitting: Family Medicine

## 2012-02-02 DIAGNOSIS — Z1231 Encounter for screening mammogram for malignant neoplasm of breast: Secondary | ICD-10-CM

## 2012-05-16 ENCOUNTER — Other Ambulatory Visit (INDEPENDENT_AMBULATORY_CARE_PROVIDER_SITE_OTHER): Payer: Managed Care, Other (non HMO)

## 2012-05-16 DIAGNOSIS — Z0000 Encounter for general adult medical examination without abnormal findings: Secondary | ICD-10-CM

## 2012-05-16 LAB — CBC WITH DIFFERENTIAL/PLATELET
Basophils Absolute: 0 10*3/uL (ref 0.0–0.1)
Eosinophils Absolute: 0.3 10*3/uL (ref 0.0–0.7)
HCT: 40.1 % (ref 36.0–46.0)
Hemoglobin: 13 g/dL (ref 12.0–15.0)
Lymphs Abs: 2 10*3/uL (ref 0.7–4.0)
MCHC: 32.4 g/dL (ref 30.0–36.0)
Monocytes Absolute: 0.5 10*3/uL (ref 0.1–1.0)
Neutro Abs: 2 10*3/uL (ref 1.4–7.7)
Platelets: 257 10*3/uL (ref 150.0–400.0)
RDW: 15.4 % — ABNORMAL HIGH (ref 11.5–14.6)

## 2012-05-16 LAB — HEPATIC FUNCTION PANEL
AST: 16 U/L (ref 0–37)
Albumin: 4 g/dL (ref 3.5–5.2)
Total Bilirubin: 0.6 mg/dL (ref 0.3–1.2)

## 2012-05-16 LAB — TSH: TSH: 0.31 u[IU]/mL — ABNORMAL LOW (ref 0.35–5.50)

## 2012-05-16 LAB — BASIC METABOLIC PANEL
BUN: 15 mg/dL (ref 6–23)
CO2: 29 mEq/L (ref 19–32)
Glucose, Bld: 203 mg/dL — ABNORMAL HIGH (ref 70–99)
Potassium: 3.9 mEq/L (ref 3.5–5.1)
Sodium: 140 mEq/L (ref 135–145)

## 2012-05-16 LAB — LIPID PANEL
Total CHOL/HDL Ratio: 5
Triglycerides: 113 mg/dL (ref 0.0–149.0)

## 2012-05-17 LAB — POCT URINALYSIS DIPSTICK
Blood, UA: NEGATIVE
Leukocytes, UA: NEGATIVE
Nitrite, UA: NEGATIVE
Urobilinogen, UA: 0.2

## 2012-05-22 ENCOUNTER — Other Ambulatory Visit: Payer: Managed Care, Other (non HMO)

## 2012-06-05 ENCOUNTER — Encounter: Payer: Managed Care, Other (non HMO) | Admitting: Family Medicine

## 2012-07-01 ENCOUNTER — Telehealth: Payer: Self-pay | Admitting: Family Medicine

## 2012-07-01 DIAGNOSIS — E049 Nontoxic goiter, unspecified: Secondary | ICD-10-CM

## 2012-07-01 MED ORDER — LEVOTHYROXINE SODIUM 50 MCG PO TABS: 50.0000 ug | ORAL_TABLET | Freq: Every day | ORAL | Status: DC

## 2012-07-01 MED ORDER — ATENOLOL-CHLORTHALIDONE 50-25 MG PO TABS: ORAL_TABLET | ORAL | Status: DC

## 2012-07-01 NOTE — Telephone Encounter (Signed)
Patient called to reschedule her cpx and her rxs have expired and she need her atenolol 50-25mg  1poqd in the morning and her synthroid 50 mcg 1poqd sent to [facility]. Please assist.

## 2012-07-01 NOTE — Telephone Encounter (Signed)
Rx sent to pharmacy   

## 2012-07-05 ENCOUNTER — Ambulatory Visit (INDEPENDENT_AMBULATORY_CARE_PROVIDER_SITE_OTHER): Payer: Managed Care, Other (non HMO) | Admitting: Family Medicine

## 2012-07-05 ENCOUNTER — Encounter: Payer: Self-pay | Admitting: Family Medicine

## 2012-07-05 VITALS — BP 104/78 | Temp 99.3°F | Wt 167.0 lb

## 2012-07-05 DIAGNOSIS — K5289 Other specified noninfective gastroenteritis and colitis: Secondary | ICD-10-CM

## 2012-07-05 DIAGNOSIS — K529 Noninfective gastroenteritis and colitis, unspecified: Secondary | ICD-10-CM

## 2012-07-05 MED ORDER — ONDANSETRON HCL 4 MG PO TABS: 4.0000 mg | ORAL_TABLET | Freq: Three times a day (TID) | ORAL | Status: DC | PRN

## 2012-07-05 NOTE — Progress Notes (Signed)
Chief Complaint  Patient presents with  . N/V/D    HPI:  Acute visit for N/V/D: -4 days ago -watery diarrhea, nausea, vomiting -hasn't vomited in 2 days, diarrhea still persistent -denies: fever, abd pain, urinary symptoms, normal urine output, blood in stools -people at work with the same symptoms  ROS: See pertinent positives and negatives per HPI.  Past Medical History  Diagnosis Date  . Allergy   . Hypertension   . Goiter     Family History  Problem Relation Age of Onset  . Hypertension      fhx    History   Social History  . Marital Status: Single    Spouse Name: N/A    Number of Children: N/A  . Years of Education: N/A   Social History Main Topics  . Smoking status: Never Smoker   . Smokeless tobacco: None  . Alcohol Use: No  . Drug Use: No  . Sexually Active:    Other Topics Concern  . None   Social History Narrative  . None    Current outpatient prescriptions:albuterol (PROVENTIL HFA;VENTOLIN HFA) 108 (90 BASE) MCG/ACT inhaler, Inhale 2 puffs into the lungs every 6 (six) hours as needed for wheezing., Disp: 1 Inhaler, Rfl: 2;  aspirin 81 MG tablet, Take 81 mg by mouth daily.  , Disp: , Rfl: ;  atenolol-chlorthalidone (TENORETIC) 50-25 MG per tablet, 1 tablet daily in the morning, Disp: 100 tablet, Rfl: 3 chlorpheniramine-HYDROcodone (TUSSIONEX PENNKINETIC ER) 10-8 MG/5ML LQCR, Take 5 mLs by mouth every 12 (twelve) hours as needed., Disp: 140 mL, Rfl: 0;  levothyroxine (SYNTHROID, LEVOTHROID) 50 MCG tablet, Take 1 tablet (50 mcg total) by mouth daily., Disp: 100 tablet, Rfl: 3;  ondansetron (ZOFRAN) 4 MG tablet, Take 1 tablet (4 mg total) by mouth every 8 (eight) hours as needed for nausea., Disp: 20 tablet, Rfl: 0  EXAM:  Filed Vitals:   07/05/12 1359  BP: 104/78  Temp: 99.3 F (37.4 C)    Body mass index is 27.59 kg/(m^2).  GENERAL: vitals reviewed and listed above, alert, oriented, appears well hydrated and in no acute distress  HEENT:  atraumatic, conjunttiva clear, no obvious abnormalities on inspection of external nose and ears  NECK: no obvious masses on inspection  LUNGS: clear to auscultation bilaterally, no wheezes, rales or rhonchi, good air movement  CV: HRRR, no peripheral edema  ABD: BS +, soft, NTTP  MS: moves all extremities without noticeable abnormality  PSYCH: pleasant and cooperative, no obvious depression or anxiety  ASSESSMENT AND PLAN:  Discussed the following assessment and plan:  Gastroenteritis - Plan: ondansetron (ZOFRAN) 4 MG tablet  -Patient advised to return or notify a doctor immediately if symptoms worsen or persist or new concerns arise.  Patient Instructions  -plenty of fluids  -no dairy until better  -zofran as needed  -follow up as needed     KIM, HANNAH R.

## 2012-07-05 NOTE — Patient Instructions (Addendum)
-  plenty of fluids  -no dairy until better  -zofran as needed  -follow up as needed

## 2012-07-16 ENCOUNTER — Encounter: Payer: Managed Care, Other (non HMO) | Admitting: Family Medicine

## 2012-07-19 ENCOUNTER — Inpatient Hospital Stay (HOSPITAL_COMMUNITY)
Admission: EM | Admit: 2012-07-19 | Discharge: 2012-07-23 | DRG: 811 | Disposition: A | Discharge status: Home / Self Care | Payer: Managed Care, Other (non HMO) | Attending: Internal Medicine | Admitting: Internal Medicine

## 2012-07-19 ENCOUNTER — Inpatient Hospital Stay (HOSPITAL_COMMUNITY): Payer: Managed Care, Other (non HMO)

## 2012-07-19 ENCOUNTER — Encounter (HOSPITAL_COMMUNITY): Payer: Self-pay | Admitting: Emergency Medicine

## 2012-07-19 DIAGNOSIS — Z79899 Other long term (current) drug therapy: Secondary | ICD-10-CM

## 2012-07-19 DIAGNOSIS — H209 Unspecified iridocyclitis: Secondary | ICD-10-CM

## 2012-07-19 DIAGNOSIS — E119 Type 2 diabetes mellitus without complications: Secondary | ICD-10-CM | POA: Diagnosis present

## 2012-07-19 DIAGNOSIS — K922 Gastrointestinal hemorrhage, unspecified: Secondary | ICD-10-CM

## 2012-07-19 DIAGNOSIS — R1110 Vomiting, unspecified: Secondary | ICD-10-CM

## 2012-07-19 DIAGNOSIS — E049 Nontoxic goiter, unspecified: Secondary | ICD-10-CM | POA: Diagnosis present

## 2012-07-19 DIAGNOSIS — E669 Obesity, unspecified: Secondary | ICD-10-CM

## 2012-07-19 DIAGNOSIS — D693 Immune thrombocytopenic purpura: Secondary | ICD-10-CM | POA: Diagnosis present

## 2012-07-19 DIAGNOSIS — Z7982 Long term (current) use of aspirin: Secondary | ICD-10-CM

## 2012-07-19 DIAGNOSIS — J309 Allergic rhinitis, unspecified: Secondary | ICD-10-CM

## 2012-07-19 DIAGNOSIS — J45909 Unspecified asthma, uncomplicated: Secondary | ICD-10-CM | POA: Diagnosis present

## 2012-07-19 DIAGNOSIS — D696 Thrombocytopenia, unspecified: Secondary | ICD-10-CM | POA: Diagnosis present

## 2012-07-19 DIAGNOSIS — E039 Hypothyroidism, unspecified: Secondary | ICD-10-CM | POA: Diagnosis present

## 2012-07-19 DIAGNOSIS — D62 Acute posthemorrhagic anemia: Principal | ICD-10-CM | POA: Diagnosis present

## 2012-07-19 DIAGNOSIS — Q828 Other specified congenital malformations of skin: Secondary | ICD-10-CM

## 2012-07-19 DIAGNOSIS — K2971 Gastritis, unspecified, with bleeding: Secondary | ICD-10-CM | POA: Diagnosis present

## 2012-07-19 DIAGNOSIS — I10 Essential (primary) hypertension: Secondary | ICD-10-CM | POA: Diagnosis present

## 2012-07-19 DIAGNOSIS — I959 Hypotension, unspecified: Secondary | ICD-10-CM | POA: Diagnosis present

## 2012-07-19 DIAGNOSIS — D649 Anemia, unspecified: Secondary | ICD-10-CM

## 2012-07-19 HISTORY — DX: Hypothyroidism, unspecified: E03.9

## 2012-07-19 LAB — CBC WITH DIFFERENTIAL/PLATELET
Basophils Relative: 0 % (ref 0–1)
Basophils Relative: 0 % (ref 0–1)
Eosinophils Absolute: 0.2 10*3/uL (ref 0.0–0.7)
Eosinophils Absolute: 0.3 10*3/uL (ref 0.0–0.7)
Eosinophils Relative: 4 % (ref 0–5)
HCT: 18.2 % — ABNORMAL LOW (ref 36.0–46.0)
Hemoglobin: 7.6 g/dL — ABNORMAL LOW (ref 12.0–15.0)
Hemoglobin: 5.8 g/dL — CL (ref 12.0–15.0)
Lymphocytes Relative: 31 % (ref 12–46)
MCH: 23.6 pg — ABNORMAL LOW (ref 26.0–34.0)
MCH: 22.9 pg — ABNORMAL LOW (ref 26.0–34.0)
MCHC: 31.5 g/dL (ref 30.0–36.0)
MCHC: 31.9 g/dL (ref 30.0–36.0)
Monocytes Absolute: 0.7 10*3/uL (ref 0.1–1.0)
Monocytes Relative: 8 % (ref 3–12)
Neutro Abs: 4.7 10*3/uL (ref 1.7–7.7)
Neutrophils Relative %: 73 % (ref 43–77)
RBC: 2.53 MIL/uL — ABNORMAL LOW (ref 3.87–5.11)

## 2012-07-19 LAB — GLUCOSE, CAPILLARY: Glucose-Capillary: 242 mg/dL — ABNORMAL HIGH (ref 70–99)

## 2012-07-19 LAB — POCT I-STAT, CHEM 8
Calcium, Ion: 1.17 mmol/L (ref 1.12–1.23)
Creatinine, Ser: 0.9 mg/dL (ref 0.50–1.10)
Glucose, Bld: 339 mg/dL — ABNORMAL HIGH (ref 70–99)
Hemoglobin: 8.5 g/dL — ABNORMAL LOW (ref 12.0–15.0)
Potassium: 3.4 mEq/L — ABNORMAL LOW (ref 3.5–5.1)
TCO2: 30 mmol/L (ref 0–100)

## 2012-07-19 LAB — RETICULOCYTES
RBC.: 3.22 MIL/uL — ABNORMAL LOW (ref 3.87–5.11)
Retic Count, Absolute: 309.1 10*3/uL — ABNORMAL HIGH (ref 19.0–186.0)

## 2012-07-19 LAB — DIC (DISSEMINATED INTRAVASCULAR COAGULATION)PANEL
D-Dimer, Quant: 6.22 ug/mL-FEU — ABNORMAL HIGH (ref 0.00–0.48)
Fibrinogen: 288 mg/dL (ref 204–475)
INR: 1.16 (ref 0.00–1.49)
Prothrombin Time: 14.6 seconds (ref 11.6–15.2)
aPTT: 24 seconds (ref 24–37)

## 2012-07-19 LAB — DIRECT ANTIGLOBULIN TEST (NOT AT ARMC)
DAT, IgG: NEGATIVE
DAT, complement: NEGATIVE

## 2012-07-19 LAB — FERRITIN: Ferritin: 28 ng/mL (ref 10–291)

## 2012-07-19 LAB — IRON AND TIBC: UIBC: 331 ug/dL (ref 125–400)

## 2012-07-19 LAB — MRSA PCR SCREENING: MRSA by PCR: NEGATIVE

## 2012-07-19 LAB — HAPTOGLOBIN: Haptoglobin: 75 mg/dL (ref 45–215)

## 2012-07-19 LAB — FOLATE: Folate: 18.2 ng/mL

## 2012-07-19 MED ORDER — MORPHINE SULFATE 2 MG/ML IJ SOLN: 2.0000 mg | INTRAMUSCULAR | Status: DC | PRN

## 2012-07-19 MED ORDER — ONDANSETRON HCL 4 MG/2ML IJ SOLN: 4.0000 mg | Freq: Four times a day (QID) | INTRAMUSCULAR | Status: DC | PRN

## 2012-07-19 MED ORDER — PANTOPRAZOLE SODIUM 40 MG IV SOLR
40.0000 mg | INTRAVENOUS | Status: DC
  Administered 2012-07-19 – 2012-07-21 (×3): 40 mg via INTRAVENOUS
  Filled 2012-07-19 (×4): qty 40

## 2012-07-19 MED ORDER — SODIUM CHLORIDE 0.9 % IJ SOLN
3.0000 mL | Freq: Two times a day (BID) | INTRAMUSCULAR | Status: DC
  Administered 2012-07-19 – 2012-07-23 (×10): 3 mL via INTRAVENOUS

## 2012-07-19 MED ORDER — PREDNISONE 20 MG PO TABS
20.0000 mg | ORAL_TABLET | Freq: Every day | ORAL | Status: DC
  Administered 2012-07-20 – 2012-07-22 (×3): 20 mg via ORAL
  Filled 2012-07-19 (×3): qty 1

## 2012-07-19 MED ORDER — OXYCODONE HCL 5 MG PO TABS: 5.0000 mg | ORAL_TABLET | ORAL | Status: DC | PRN

## 2012-07-19 MED ORDER — ALBUTEROL SULFATE HFA 108 (90 BASE) MCG/ACT IN AERS: 2.0000 | INHALATION_SPRAY | Freq: Four times a day (QID) | RESPIRATORY_TRACT | Status: DC | PRN

## 2012-07-19 MED ORDER — SODIUM CHLORIDE 0.9 % IJ SOLN
3.0000 mL | INTRAMUSCULAR | Status: DC | PRN
  Administered 2012-07-22: 3 mL via INTRAVENOUS

## 2012-07-19 MED ORDER — PREDNISONE 50 MG PO TABS
50.0000 mg | ORAL_TABLET | Freq: Every day | ORAL | Status: DC
  Administered 2012-07-20 – 2012-07-22 (×3): 50 mg via ORAL
  Filled 2012-07-19 (×4): qty 1

## 2012-07-19 MED ORDER — PREDNISONE 20 MG PO TABS
20.0000 mg | ORAL_TABLET | Freq: Every day | ORAL | Status: DC
  Filled 2012-07-19: qty 1

## 2012-07-19 MED ORDER — SODIUM CHLORIDE 0.9 % IV BOLUS (SEPSIS)
1000.0000 mL | Freq: Once | INTRAVENOUS | Status: AC
  Administered 2012-07-19: 1000 mL via INTRAVENOUS

## 2012-07-19 MED ORDER — ACETAMINOPHEN 325 MG PO TABS
650.0000 mg | ORAL_TABLET | Freq: Four times a day (QID) | ORAL | Status: DC | PRN
  Administered 2012-07-20 – 2012-07-21 (×3): 650 mg via ORAL
  Filled 2012-07-19 (×3): qty 2

## 2012-07-19 MED ORDER — SODIUM CHLORIDE 0.9 % IV SOLN: 250.0000 mL | INTRAVENOUS | Status: DC | PRN

## 2012-07-19 MED ORDER — INSULIN ASPART 100 UNIT/ML ~~LOC~~ SOLN
0.0000 [IU] | Freq: Three times a day (TID) | SUBCUTANEOUS | Status: DC
  Administered 2012-07-20: 8 [IU] via SUBCUTANEOUS
  Administered 2012-07-20: 5 [IU] via SUBCUTANEOUS
  Administered 2012-07-20: 3 [IU] via SUBCUTANEOUS
  Administered 2012-07-21 (×2): 8 [IU] via SUBCUTANEOUS
  Administered 2012-07-21: 2 [IU] via SUBCUTANEOUS
  Administered 2012-07-22: 8 [IU] via SUBCUTANEOUS
  Administered 2012-07-22: 5 [IU] via SUBCUTANEOUS
  Administered 2012-07-22: 2 [IU] via SUBCUTANEOUS
  Administered 2012-07-23: 8 [IU] via SUBCUTANEOUS

## 2012-07-19 MED ORDER — LEVOTHYROXINE SODIUM 50 MCG PO TABS
50.0000 ug | ORAL_TABLET | Freq: Every day | ORAL | Status: DC
  Administered 2012-07-19 – 2012-07-23 (×5): 50 ug via ORAL
  Filled 2012-07-19 (×7): qty 1

## 2012-07-19 MED ORDER — PREDNISONE 20 MG PO TABS: 20.0000 mg | ORAL_TABLET | Freq: Every day | ORAL | Status: DC

## 2012-07-19 MED ORDER — ONDANSETRON HCL 4 MG PO TABS: 4.0000 mg | ORAL_TABLET | Freq: Four times a day (QID) | ORAL | Status: DC | PRN

## 2012-07-19 MED ORDER — FUROSEMIDE 10 MG/ML IJ SOLN
20.0000 mg | Freq: Once | INTRAMUSCULAR | Status: AC
  Administered 2012-07-19: 20 mg via INTRAVENOUS
  Filled 2012-07-19: qty 2

## 2012-07-19 MED ORDER — SODIUM CHLORIDE 0.9 % IV SOLN
INTRAVENOUS | Status: DC
  Administered 2012-07-19 – 2012-07-21 (×3): via INTRAVENOUS

## 2012-07-19 MED ORDER — ACETAMINOPHEN 650 MG RE SUPP: 650.0000 mg | Freq: Four times a day (QID) | RECTAL | Status: DC | PRN

## 2012-07-19 MED ORDER — PREDNISONE 50 MG PO TABS
50.0000 mg | ORAL_TABLET | Freq: Every day | ORAL | Status: DC
  Filled 2012-07-19: qty 1

## 2012-07-19 MED ORDER — INSULIN ASPART 100 UNIT/ML ~~LOC~~ SOLN
0.0000 [IU] | Freq: Every day | SUBCUTANEOUS | Status: DC
  Administered 2012-07-19 – 2012-07-20 (×2): 2 [IU] via SUBCUTANEOUS
  Administered 2012-07-21 – 2012-07-22 (×2): 3 [IU] via SUBCUTANEOUS

## 2012-07-19 NOTE — ED Provider Notes (Signed)
History     CSN: [id number]  Arrival date & time 07/19/12  0865   First MD Initiated Contact with Patient 07/19/12 0800      Chief Complaint  Patient presents with  . Emesis    (Consider location/radiation/quality/duration/timing/severity/associated sxs/prior treatment) Patient is a 52 y.o. female presenting with vomiting.  Emesis  Pt reports she woke up around 4am feeling nauseated. She had one episode of vomiting, she was concerned there may be blood in her emesis, but reports it was brown. Denies bright red or coffee grounds. She denies pain, no further nausea or vomiting. She had a gastroenteritis like illness about 2 weeks ago. Seen at PCP office and given Rx for Zofran which she still has at home. Diarrhea has resolved. She states she still has poor appetite and decreased energy, but has been able to tolerate fluids without difficulty. Denies lightheadedness or dizziness. No labs were checked at her recent PCP office visit.   Past Medical History  Diagnosis Date  . Allergy   . Hypertension   . Goiter   . Hypothyroid     History reviewed. No pertinent past surgical history.  Family History  Problem Relation Age of Onset  . Hypertension      fhx    History  Substance Use Topics  . Smoking status: Never Smoker   . Smokeless tobacco: Not on file  . Alcohol Use: No    OB History   Grav Para Term Preterm Abortions TAB SAB Ect Mult Living                  Review of Systems  Gastrointestinal: Positive for vomiting.   All other systems reviewed and are negative except as noted in HPI.   Allergies  Review of patient's allergies indicates no known allergies.  Home Medications   Current Outpatient Rx  Name  Route  Sig  Dispense  Refill  . albuterol (PROVENTIL HFA;VENTOLIN HFA) 108 (90 BASE) MCG/ACT inhaler   Inhalation   Inhale 2 puffs into the lungs every 6 (six) hours as needed for wheezing.         Marland Kitchen aspirin 81 MG chewable tablet   Oral   Chew 81 mg  by mouth daily.         Marland Kitchen atenolol-chlorthalidone (TENORETIC) 50-25 MG per tablet   Oral   Take 1 tablet by mouth every morning.         Marland Kitchen levothyroxine (SYNTHROID, LEVOTHROID) 50 MCG tablet   Oral   Take 50 mcg by mouth daily.         . Multiple Vitamin (MULTIVITAMIN WITH MINERALS) TABS   Oral   Take 1 tablet by mouth daily.         . ondansetron (ZOFRAN) 4 MG tablet   Oral   Take 4 mg by mouth every 8 (eight) hours as needed for nausea.           BP 126/76  Pulse 104  Temp(Src) 98.7 F (37.1 C) (Oral)  Resp 14  SpO2 100%  LMP 07/14/2012  Physical Exam  Nursing note and vitals reviewed. Constitutional: She is oriented to person, place, and time. She appears well-developed and well-nourished.  HENT:  Head: Normocephalic and atraumatic.  Eyes: EOM are normal. Pupils are equal, round, and reactive to light.  Neck: Normal range of motion. Neck supple.  Cardiovascular: Normal rate, normal heart sounds and intact distal pulses.   Pulmonary/Chest: Effort normal and breath sounds normal.  Abdominal: Bowel  sounds are normal. She exhibits no distension. There is no tenderness.  Musculoskeletal: Normal range of motion. She exhibits no edema and no tenderness.  Neurological: She is alert and oriented to person, place, and time. She has normal strength. No cranial nerve deficit or sensory deficit.  Skin: Skin is warm and dry. No rash noted.  Psychiatric: She has a normal mood and affect.    ED Course  Procedures (including critical care time)  Labs Reviewed  CBC WITH DIFFERENTIAL - Abnormal; Notable for the following:    RBC 3.22 (*)    Hemoglobin 7.6 (*)    HCT 24.1 (*)    MCV 74.8 (*)    MCH 23.6 (*)    RDW 16.6 (*)    Platelets <5 (*)    All other components within normal limits  IRON AND TIBC - Abnormal; Notable for the following:    Saturation Ratios 18 (*)    All other components within normal limits  RETICULOCYTES - Abnormal; Notable for the following:     Retic Ct Pct 9.6 (*)    RBC. 3.22 (*)    Retic Count, Manual 309.1 (*)    All other components within normal limits  DIC (DISSEMINATED INTRAVASCULAR COAGULATION) PANEL - Abnormal; Notable for the following:    D-Dimer, Quant 6.22 (*)    Platelets <5 (*)    All other components within normal limits  POCT I-STAT, CHEM 8 - Abnormal; Notable for the following:    Potassium 3.4 (*)    Glucose, Bld 339 (*)    Hemoglobin 8.5 (*)    HCT 25.0 (*)    All other components within normal limits  OCCULT BLOOD, POC DEVICE - Abnormal; Notable for the following:    Fecal Occult Bld POSITIVE (*)    All other components within normal limits  VITAMIN B12  FOLATE  FERRITIN  TSH  PATHOLOGIST SMEAR REVIEW  TYPE AND SCREEN  ABO/RH  PREPARE PLATELET PHERESIS  PREPARE RBC (CROSSMATCH)   No results found.   1. Anemia   2. GI bleeding   3. Thrombocytopenia   4. Asthma       MDM  Abd benign, no further nausea. Will check I-stat due to continued general malaise and her concern for blood in emesis.   9:54 AM Hgb on I-stat significantly decreased from labs done in January. Hemoccult positive, but stool color is brown. Will check formal CBC, anemia panel, T&S, anticipate admission for further eval. Pt also has Glucose >300, not a known diabetic. Labs done in January while fasting showed Glucose approx. 200. CBC also shows marked thrombocytopenia. Hospitalist to admit.       Depaul Arizpe B. Bernette Mayers, MD 07/19/12 [id number]

## 2012-07-19 NOTE — Consult Note (Signed)
Reason for Consult: thrombocytopenia, anemia Referring Physician: hospitalist  Autumn Acevedo is an 52 y.o. female seen in consultation at request of Dr Brien Few, with severe thrombocytopenia, and anemia.  Patient had normal platelet count of 257k by CBC in Jan 2014, with hemoglobin 13 then. She had apparent viral gastroenteritis ~ 3 wks PTA, with diarrhea and N/V that resolved after a few days, however she continued to feel fatigued after that illness. She awoke 0400 today with prolonged vomiting, with dark emesis. She came to ED with this problem, has noticed several bruises just today, and platelets are <5K with hemoglobin 7.6. She has had no overt bleeding, no further nausea or vomiting, and no diarrhea. She had no fever during any of this time. Only different medications were imodium and zofran with the initial gastroenteritis symptoms, and prn zyrtec. CBC 05-16-2012 had WBC 4.8, ANC 2.0, Hgb 13 with MCV 71 and plt 257k.  Review of Systems:  Weight down a few pounds since GI problems. Appetite still not good, mostly eating chicken noodle soup. No fever. Slight intermittent HA last few days which has not required any intervention. Good visual acuity. Some mild post nasal drainage with history of seasonal allergies. No mucositis, esophagitis. SOB if she walks length of hall, none at rest. Some minimal cough with post nasal drainage. No chest pain. Some GERD x few days. Has had formed stools normally for past ~ week. Goiter x years has increased in past year. No LE swelling. Voiding ok. Bruising on extremities today without trauma. Petechial rash seen just today. Menses usually regular, not extremely heavy; last was much lighter than usual starting 07-14-12, no longer bleeding.       Allergies: No Known Allergies Past Medical History  Diagnosis Date  . Allergy   . Hypertension   . Goiter   . Hypothyroid   Thyroid problems since in 30s. HTN since in 30s. Dr Alonza Smoker has been PCP X 20+  years.  History reviewed. No pertinent past surgical history. No previous hospitalizations No transfusions  Family History  Problem Relation Age of Onset  . Hypertension      fhx    Social History:  reports that she has never smoked. She does not have any smokeless tobacco history on file. She reports that she does not drink alcohol or use illicit drugs. Works at [employer] office  Medications: reviewed in EMR. Only oral iron has been in [facility]   Physical Exam Blood pressure 110/75, pulse 95, temperature 99.5 F (37.5 C), temperature source Oral, resp. rate 15, last menstrual period 07/14/2012, SpO2 100.00%. Awake, alert, looks mildly uncomfortable on stretcher in ED with second unit of platelets infusing, respirations not labored RA. Mucous membranes pale. PERRL, not icteric. Oral mucosa moist. 3mm SQ hematoma left hard palate and petechiae under tongue. Good dentition. Obvious goiter, symmetrical. No JVD. No supraclavicular or cervical adenopathy. Lungs clear to bases to A & P. Spine not tender. Heart RRR, no gallop. Abdomen soft, nontender, no HSM or mass, few bowel sounds, not distended. LE no edema, cords, tenderness. Skin otherwise with few scattered petechiae on lower legs bilaterally and anterior chest bilaterally. Large ecchymosis lateral left lower leg and resolving ecchymosis inner left lower leg. No bleeding at IVs in UE. Neuro: Oriented and appropriate, speech fluent and appropriate, CN intact, motor and sensory nonfocal, moves easily on stretcher.     Results for orders placed during the hospital encounter of 07/19/12 (from the past 48 hour(s))  POCT I-STAT, CHEM  8     Status: Abnormal   Collection Time    07/19/12  9:10 AM      Result Value Range   Sodium 137  135 - 145 mEq/L   Potassium 3.4 (*) 3.5 - 5.1 mEq/L   Chloride 96  96 - 112 mEq/L   BUN 23  6 - 23 mg/dL   Creatinine, Ser 1.61  0.50 - 1.10 mg/dL   Glucose, Bld 096 (*) 70 - 99 mg/dL   Calcium, Ion 0.45  4.09 -  1.23 mmol/L   TCO2 30  0 - 100 mmol/L   Hemoglobin 8.5 (*) 12.0 - 15.0 g/dL   HCT 81.1 (*) 91.4 - 78.2 %  OCCULT BLOOD, POC DEVICE     Status: Abnormal   Collection Time    07/19/12  9:31 AM      Result Value Range   Fecal Occult Bld POSITIVE (*) NEGATIVE  CBC WITH DIFFERENTIAL     Status: Abnormal   Collection Time    07/19/12  9:50 AM      Result Value Range   WBC 7.8  4.0 - 10.5 K/uL   RBC 3.22 (*) 3.87 - 5.11 MIL/uL   Hemoglobin 7.6 (*) 12.0 - 15.0 g/dL   HCT 95.6 (*) 21.3 - 08.6 %   MCV 74.8 (*) 78.0 - 100.0 fL   MCH 23.6 (*) 26.0 - 34.0 pg   MCHC 31.5  30.0 - 36.0 g/dL   RDW 57.8 (*) 46.9 - 62.9 %   Platelets <5 (*) 150 - 400 K/uL   Comment: REPEATED TO VERIFY     CRITICAL RESULT CALLED TO, READ BACK BY AND VERIFIED WITH:     BULLOCK,N RN @ 1112 ON 07/19/12 BY LEONARD,A     SPECIMEN CHECKED FOR CLOTS     PLATELET COUNT CONFIRMED BY SMEAR   Neutrophils Relative 73  43 - 77 %   Neutro Abs 5.7  1.7 - 7.7 K/uL   Lymphocytes Relative 17  12 - 46 %   Lymphs Abs 1.3  0.7 - 4.0 K/uL   Monocytes Relative 8  3 - 12 %   Monocytes Absolute 0.6  0.1 - 1.0 K/uL   Eosinophils Relative 2  0 - 5 %   Eosinophils Absolute 0.2  0.0 - 0.7 K/uL   Basophils Relative 0  0 - 1 %   Basophils Absolute 0.0  0.0 - 0.1 K/uL  VITAMIN B12     Status: None   Collection Time    07/19/12  9:50 AM      Result Value Range   Vitamin B-12 680  211 - 911 pg/mL  FOLATE     Status: None   Collection Time    07/19/12  9:50 AM      Result Value Range   Folate 18.2     Comment: (NOTE)     Reference Ranges            Deficient:       0.4 - 3.3 ng/mL            Indeterminate:   3.4 - 5.4 ng/mL            Normal:              > 5.4 ng/mL  IRON AND TIBC     Status: Abnormal   Collection Time    07/19/12  9:50 AM      Result Value Range   Iron 75  42 - 135 ug/dL   TIBC 086  578 - 469 ug/dL   Saturation Ratios 18 (*) 20 - 55 %   UIBC 331  125 - 400 ug/dL  FERRITIN     Status: None   Collection Time     07/19/12  9:50 AM      Result Value Range   Ferritin 28  10 - 291 ng/mL  RETICULOCYTES     Status: Abnormal   Collection Time    07/19/12  9:50 AM      Result Value Range   Retic Ct Pct 9.6 (*) 0.4 - 3.1 %   RBC. 3.22 (*) 3.87 - 5.11 MIL/uL   Retic Count, Manual 309.1 (*) 19.0 - 186.0 K/uL  TYPE AND SCREEN     Status: None   Collection Time    07/19/12  9:55 AM      Result Value Range   ABO/RH(D) B POS     Antibody Screen NEG     Sample Expiration 07/22/2012     Unit Number [id number]     Blood Component Type RBC LR PHER1     Unit division 00     Status of Unit ALLOCATED     Transfusion Status OK TO TRANSFUSE     Crossmatch Result Compatible     Unit Number [id number]     Blood Component Type RED CELLS,LR     Unit division 00     Status of Unit ALLOCATED     Transfusion Status OK TO TRANSFUSE     Crossmatch Result Compatible    ABO/RH     Status: None   Collection Time    07/19/12  9:55 AM      Result Value Range   ABO/RH(D) B POS    DIC (DISSEMINATED INTRAVASCULAR COAGULATION) PANEL     Status: Abnormal   Collection Time    07/19/12 11:44 AM      Result Value Range   Prothrombin Time 14.6  11.6 - 15.2 seconds   INR 1.16  0.00 - 1.49   aPTT 24  24 - 37 seconds   Fibrinogen 288  204 - 475 mg/dL   D-Dimer, Quant 4.74 (*) 0.00 - 0.48 ug/mL-FEU   Comment:            AT THE INHOUSE ESTABLISHED CUTOFF     VALUE OF 0.48 ug/mL FEU,     THIS ASSAY HAS BEEN DOCUMENTED     IN THE LITERATURE TO HAVE     A SENSITIVITY AND NEGATIVE     PREDICTIVE VALUE OF AT LEAST     98 TO 99%.  THE TEST RESULT     SHOULD BE CORRELATED WITH     AN ASSESSMENT OF THE CLINICAL     PROBABILITY OF DVT / VTE.   Platelets <5 (*) 150 - 400 K/uL   Comment: CRITICAL VALUE NOTED.  VALUE IS CONSISTENT WITH PREVIOUSLY REPORTED AND CALLED VALUE.     NO SCHISTOCYTES SEEN   Smear Review NO SCHISTOCYTES SEEN    PREPARE PLATELET PHERESIS     Status: None   Collection Time    07/19/12 12:19 PM       Result Value Range   Unit Number [id number]     Blood Component Type PLTPHER LR1     Unit division 00     Status of Unit ISSUED     Transfusion Status OK TO TRANSFUSE     Unit Number [id number]  Blood Component Type PLTPHER LR1     Unit division 00     Status of Unit ISSUED     Transfusion Status OK TO TRANSFUSE    PREPARE RBC (CROSSMATCH)     Status: None   Collection Time    07/19/12 12:21 PM      Result Value Range   Order Confirmation ORDER PROCESSED BY BLOOD BANK       Peripheral blood smear reviewed now: only 1-2 platelets seen total on multiple fields. Only extremely rare fragmented cell. RBCs hypochromic microcytic. No immature WBCs seen, some bands and segs which appear normal.    US Abdomen Complete  07/19/2012  *RADIOLOGY REPORT*  Clinical Data:  Thrombocytopenia and anemia.  COMPLETE ABDOMINAL ULTRASOUND  Comparison:  None.  Findings:  Gallbladder:  The gallbladder is not distended and the wall measures up to 4 mm.  There is echogenic material near the gallbladder fundus which could represent a prominent fold but cannot exclude echogenic material, such as sludge or polyp.  Common bile duct:  Common bile duct measures 0.4 cm.  Liver:  The liver parenchyma is slightly echogenic and heterogeneous.  There is no focal liver lesion. The main portal vein is patent.  Difficult to exclude mild nodularity of the liver contour.  IVC:  Not seen.  Pancreas:  No focal abnormality seen.  Spleen:  The spleen measures 5.6 cm in length.  Right Kidney:  Right kidney has a normal appearance.  The right kidney measures 10.3 cm in length without hydronephrosis.  Left Kidney:  Left kidney has a normal appearance and measures 11.0 cm in length.  No evidence for left hydronephrosis.  Abdominal aorta:  No aneurysm identified.  IMPRESSION:  There is thickening of the gallbladder wall which may be related to under distention.  In particular, the fundus of the gallbladder is markedly thickened and  cannot exclude a polyp or even sludge in this area.  No definite gallstones.  The liver parenchyma is heterogeneous and cannot exclude hepatic steatosis.   Original Report Authenticated By: Richarda Overlie, M.D.         Assessment/Plan: 1. Thrombocytopenia: platelets <5k, having been normal in Jan 2014. No schistocytes and renal function normal so not TTP/HUS. WBC normal. No new medications seem implicated. Spleen not enlarged. Not obviously infected. 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 I have ordered 30 min post transfusion platelet count, as this may not bump at all if it is ITP. I will begin prednisone at ~ 1 mg/kg tonight; consider IVIG tomorrow. Daily CBC/diff/smear. 2.Anemia: also new since Jan 2014. Stools are heme + and may have had blood with emesis PTA today. Drop is concerning for active bleeding or hemolysis. Bili not elevated; LDH, haptoglobin,DAT pending. PRBCs ordered by primary service as she does seem symptomatic with minimal exertion. (Zyrtec reportedly can cause hemolytic anemia, so would not use this now). 3. Recent viral type gastroenteritis 4.goiter, on synthroid  My partners will see her over weekend. Please call if we can be of help between rounds.  Amiaya Mcneeley P 07/19/2012, 3:55 PM   [phone]

## 2012-07-19 NOTE — Progress Notes (Signed)
Received report that 2 units of blood needed to be transfused. Pt blood ready in blood bank.  Transfusion begun @ 2045

## 2012-07-19 NOTE — ED Notes (Signed)
Pt from home, c/o dark brown emesis, Per pt. Woke up suddenly and vomited. Denies n/v since, slight ABD pain. No cp,sob, signs of distress.

## 2012-07-19 NOTE — ED Notes (Signed)
Report given to Carolyn RN

## 2012-07-19 NOTE — H&P (Signed)
PCP:   Evette Georges, MD   Chief Complaint:  Vomiting, weakness.   HPI: This is an 52 year old female, with known history of HTN, Goiter/Hypothyroidism, seasonal allergies, query mild bronchial Asthma, seen on 07/05/12, at PMD's office, for 4 days of watery diarrhea, nausea, vomiting, treated with plenty of fluids and prn Zofran. Symptoms resolved in 2-3 days, but patient has been feeling fatigued and has lacked energy since. She has had no respiratory tract symptoms or fever. At about 4:00 AM on 07/19/12, she was woken up, by feeling sick to her stomach. Vomited once, and described the vomitus as "very dark", although she could not say whether there was blood in it. She denies "coffee grounds". There was no abdominal pain. Patient's sister brought her to the ED, where she was found to have HB 8.5-7.6, positive FOBT and platelet count of <5.     Allergies:  No Known Allergies    Past Medical History  Diagnosis Date  . Allergy   . Hypertension   . Goiter   . Hypothyroid     History reviewed. No pertinent past surgical history.  Prior to Admission medications   Medication Sig Start Date End Date Taking? Authorizing Provider  albuterol (PROVENTIL HFA;VENTOLIN HFA) 108 (90 BASE) MCG/ACT inhaler Inhale 2 puffs into the lungs every 6 (six) hours as needed for wheezing.   Yes Historical Provider, MD  aspirin 81 MG chewable tablet Chew 81 mg by mouth daily.   Yes Historical Provider, MD  atenolol-chlorthalidone (TENORETIC) 50-25 MG per tablet Take 1 tablet by mouth every morning.   Yes Historical Provider, MD  levothyroxine (SYNTHROID, LEVOTHROID) 50 MCG tablet Take 50 mcg by mouth daily.   Yes Historical Provider, MD  Multiple Vitamin (MULTIVITAMIN WITH MINERALS) TABS Take 1 tablet by mouth daily.   Yes Historical Provider, MD  ondansetron (ZOFRAN) 4 MG tablet Take 4 mg by mouth every 8 (eight) hours as needed for nausea.   Yes Historical Provider, MD    Social History: Patient  reports that she has never smoked. She does not have any smokeless tobacco history on file. She reports that she does not drink alcohol or use illicit drugs.  Family History  Problem Relation Age of Onset  . Hypertension      fhx    Review of Systems:  As per HPI and chief complaint. Patent denies diminished appetite, weight loss, fever, chills, headache, blurred vision, difficulty in speaking, dysphagia, chest pain, cough, shortness of breath, orthopnea, paroxysmal nocturnal dyspnea, nausea, diaphoresis, abdominal pain, diarrhea, belching, heartburn, hematemesis, melena, dysuria, nocturia, urinary frequency, hematochezia, lower extremity swelling, pain, or redness. The rest of the systems review is negative.  Physical Exam:  General:  Patient does not appear to be in obvious acute distress. Alert, communicative, fully oriented, talking in complete sentences, not short of breath at rest.  HEENT:  Moderate clinical pallor, no jaundice, no conjunctival injection or discharge. NECK:  Supple, JVP not seen, no carotid bruits, no palpable lymphadenopathy. Has palpable, multi-nodula goiter. CHEST:  Clinically clear to auscultation, no wheezes, no crackles. HEART:  Sounds 1 and 2 heard, normal, regular, no murmurs. ABDOMEN:  Full, soft, non-tender, no palpable organomegaly, no palpable masses, normal bowel sounds. GENITALIA:  Not examined. LOWER EXTREMITIES:  No pitting edema, palpable peripheral pulses. MUSCULOSKELETAL SYSTEM:  Unremarkable. CENTRAL NERVOUS SYSTEM:  No focal neurologic deficit on gross examination.  Labs on Admission:  Results for orders placed during the hospital encounter of 07/19/12 (from the past 48  hour(s))  POCT I-STAT, CHEM 8     Status: Abnormal   Collection Time    07/19/12  9:10 AM      Result Value Range   Sodium 137  135 - 145 mEq/L   Potassium 3.4 (*) 3.5 - 5.1 mEq/L   Chloride 96  96 - 112 mEq/L   BUN 23  6 - 23 mg/dL   Creatinine, Ser 1.61  0.50 - 1.10 mg/dL    Glucose, Bld 096 (*) 70 - 99 mg/dL   Calcium, Ion 0.45  4.09 - 1.23 mmol/L   TCO2 30  0 - 100 mmol/L   Hemoglobin 8.5 (*) 12.0 - 15.0 g/dL   HCT 81.1 (*) 91.4 - 78.2 %  OCCULT BLOOD, POC DEVICE     Status: Abnormal   Collection Time    07/19/12  9:31 AM      Result Value Range   Fecal Occult Bld POSITIVE (*) NEGATIVE  CBC WITH DIFFERENTIAL     Status: Abnormal   Collection Time    07/19/12  9:50 AM      Result Value Range   WBC 7.8  4.0 - 10.5 K/uL   RBC 3.22 (*) 3.87 - 5.11 MIL/uL   Hemoglobin 7.6 (*) 12.0 - 15.0 g/dL   HCT 95.6 (*) 21.3 - 08.6 %   MCV 74.8 (*) 78.0 - 100.0 fL   MCH 23.6 (*) 26.0 - 34.0 pg   MCHC 31.5  30.0 - 36.0 g/dL   RDW 57.8 (*) 46.9 - 62.9 %   Platelets <5 (*) 150 - 400 K/uL   Comment: REPEATED TO VERIFY     CRITICAL RESULT CALLED TO, READ BACK BY AND VERIFIED WITH:     BULLOCK,N RN @ 1112 ON 07/19/12 BY LEONARD,A     SPECIMEN CHECKED FOR CLOTS     PLATELET COUNT CONFIRMED BY SMEAR   Neutrophils Relative 73  43 - 77 %   Neutro Abs 5.7  1.7 - 7.7 K/uL   Lymphocytes Relative 17  12 - 46 %   Lymphs Abs 1.3  0.7 - 4.0 K/uL   Monocytes Relative 8  3 - 12 %   Monocytes Absolute 0.6  0.1 - 1.0 K/uL   Eosinophils Relative 2  0 - 5 %   Eosinophils Absolute 0.2  0.0 - 0.7 K/uL   Basophils Relative 0  0 - 1 %   Basophils Absolute 0.0  0.0 - 0.1 K/uL  RETICULOCYTES     Status: Abnormal   Collection Time    07/19/12  9:50 AM      Result Value Range   Retic Ct Pct 9.6 (*) 0.4 - 3.1 %   RBC. 3.22 (*) 3.87 - 5.11 MIL/uL   Retic Count, Manual 309.1 (*) 19.0 - 186.0 K/uL  TYPE AND SCREEN     Status: None   Collection Time    07/19/12  9:55 AM      Result Value Range   ABO/RH(D) B POS     Antibody Screen NEG     Sample Expiration 07/22/2012      Radiological Exams on Admission: No results found.  Assessment/Plan Active Problems:    1. Anemia/Thrombocytopenia: Patient presented with progressive fatigue, following a presumed self-limited viral  gastroenteritis, 2 weeks ago. Incidentally, found to have norrmal wcc, HB 8.5-7.6, positive FOBT and platelet count of <5, but no obvious bruising. Baseline HB from 05/16/12 was 13.0, with platelet of 257. She has a low MCV and elevated  retic count. Etiology is unclear. Fibrinogen is normal, as is coagulation profile. Creatinine is normal. Blood smear is pending at this time, as is B12 and Folate levels. Will transfuse 2 units of PRBC, as well as 2 units of blood platelets. Dr Jama Flavors, hematologist-Oncologist, has been consulted. Etiology may be viral, a hematologic condition or medication-induced. Will hold Tenoretic and ASA. Abdominal U/S has been ordered, to evaluate for possible splenomegaly. GI work up will likely be needed in due course.  2. Vomiting: Etiology unclear, but may be due to an acute gastroenteritis. Will manage with PPI and iv fluids.  3. HTN (hypertension): Patient is normotensive at this time. Holding Tenoretic as described above. Will observe, and intervene as indicated, if BP starts creeping up.  4. Hypothyroidism: Will continue thyroxine replacement therapy and check TSH.  5. Asthma: Per patient, this is usually very mild. No evidence of exacerbation at this time. For prn bronchodilators.   Further management will depend on clinical course.   Comment: patient is Full Code.    Time Spent on Admission: 1 hour.  Rayme Bui,CHRISTOPHER 07/19/2012, 12:20 PM

## 2012-07-19 NOTE — ED Notes (Signed)
Pt returned from [place]. Ambulated to restroom with no issues.

## 2012-07-19 NOTE — ED Notes (Signed)
First attempt made to call report.  Nurse not assigned for pt.  Charge RN stated she would call back.

## 2012-07-19 NOTE — Progress Notes (Signed)
Dr. Burnadette Peter advised of hgb 5.8 and gluose of 179. Will write orders

## 2012-07-19 NOTE — Progress Notes (Signed)
CRITICAL VALUE ALERT  Critical value received: hgb 5.8  Date of notification:  07/19/12  Time of notification: 1900  Critical value read back:yes  Nurse who received alert:  Eber Jones, RN  MD notified (1st page):  Burnadette Peter  Time of first page:  [phone]  MD notified (2nd page):  Time of second page:  Responding MD: Burnadette Peter  Time MD responded:  [phone]

## 2012-07-19 NOTE — ED Notes (Signed)
Pt ambulated to restroom with no issues.  

## 2012-07-19 NOTE — ED Notes (Signed)
Second attempt made to call report.  Nurse still not available.

## 2012-07-19 NOTE — Progress Notes (Signed)
Paged Dr. Brien Few to make aware that patient had arrived to the unit.

## 2012-07-19 NOTE — ED Notes (Signed)
Admitting physician at bedside

## 2012-07-20 DIAGNOSIS — R1110 Vomiting, unspecified: Secondary | ICD-10-CM

## 2012-07-20 LAB — TYPE AND SCREEN

## 2012-07-20 LAB — COMPREHENSIVE METABOLIC PANEL
Albumin: 3.2 g/dL — ABNORMAL LOW (ref 3.5–5.2)
BUN: 12 mg/dL (ref 6–23)
Calcium: 8.2 mg/dL — ABNORMAL LOW (ref 8.4–10.5)
Creatinine, Ser: 0.81 mg/dL (ref 0.50–1.10)
GFR calc Af Amer: >90 mL/min (ref >90)
Total Protein: 6.1 g/dL (ref 6.0–8.3)

## 2012-07-20 LAB — PREPARE PLATELET PHERESIS: Unit division: 0

## 2012-07-20 LAB — CBC WITH DIFFERENTIAL/PLATELET
Eosinophils Relative: 6 % — ABNORMAL HIGH (ref 0–5)
HCT: 25.2 % — ABNORMAL LOW (ref 36.0–46.0)
Lymphocytes Relative: 29 % (ref 12–46)
Lymphs Abs: 2.3 10*3/uL (ref 0.7–4.0)
MCV: 75.7 fL — ABNORMAL LOW (ref 78.0–100.0)
Monocytes Absolute: 0.7 10*3/uL (ref 0.1–1.0)
Neutro Abs: 4.5 10*3/uL (ref 1.7–7.7)
RBC: 3.33 MIL/uL — ABNORMAL LOW (ref 3.87–5.11)
WBC: 8 10*3/uL (ref 4.0–10.5)

## 2012-07-20 LAB — GLUCOSE, CAPILLARY
Glucose-Capillary: 186 mg/dL — ABNORMAL HIGH (ref 70–99)
Glucose-Capillary: 221 mg/dL — ABNORMAL HIGH (ref 70–99)

## 2012-07-20 MED ORDER — DIPHENHYDRAMINE HCL 25 MG PO CAPS
25.0000 mg | ORAL_CAPSULE | Freq: Four times a day (QID) | ORAL | Status: DC | PRN
  Administered 2012-07-20 – 2012-07-21 (×2): 25 mg via ORAL
  Filled 2012-07-20 (×2): qty 1

## 2012-07-20 MED ORDER — SODIUM CHLORIDE 0.9 % IV SOLN
1020.0000 mg | Freq: Once | INTRAVENOUS | Status: AC
  Administered 2012-07-20: 1020 mg via INTRAVENOUS
  Filled 2012-07-20: qty 34

## 2012-07-20 MED ORDER — IMMUNE GLOBULIN (HUMAN) 20 GM/200ML IV SOLN
1.0000 g/kg | INTRAVENOUS | Status: AC
  Administered 2012-07-20 – 2012-07-21 (×2): 75 g via INTRAVENOUS
  Filled 2012-07-20 (×3): qty 750

## 2012-07-20 MED ORDER — IMMUNE GLOBULIN (HUMAN) 20 GM/200ML IV SOLN
1.0000 g/kg | INTRAVENOUS | Status: DC
  Filled 2012-07-20: qty 750

## 2012-07-20 NOTE — Progress Notes (Signed)
[facility] Progress Note [place] TEAM 1 - Stepdown/ICU TEAM   Autumn Acevedo YNW:[id number] DOB: 1960-09-20 DOA: 07/19/2012 PCP: Evette Georges, MD  Brief narrative: This is an 52 year old female, with known history of HTN, Goiter/Hypothyroidism, seasonal allergies, query mild bronchial Asthma, seen on 07/05/12, at PMD's office, for 4 days of watery diarrhea, nausea, vomiting, treated with plenty of fluids and prn Zofran. Symptoms resolved in 2-3 days, but patient has been feeling fatigued and has lacked energy since. She has had no respiratory tract symptoms or fever. At about 4:00 AM on 07/19/12, she was woken up, by feeling sick to her stomach. Vomited once, and described the vomitus as "very dark", although she could not say whether there was blood in it. She denies "coffee grounds". There was no abdominal pain. Patient's sister brought her to the ED, where she was found to have HB 8.5-7.6, positive FOBT and platelet count of <5.    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 -Prednisone started today along with IVIG -No improvement in platelets noted after transfusion -Hematology recommends no further platelet transfusions unless patient is bleeding  Hematemesis -Resolved -Continue IV protonix -Continue clears for today    HTN (hypertension) -Noted to be hypotensive likely secondary to blood loss -Holding antihypertensive    Hypothyroidism -Continue levothyroxine    Asthma Stable  Code Status: Full code Family Communication: None Disposition Plan: Follow in step down unit  Consultants: Hematology  Procedures: None  Antibiotics: None  DVT prophylaxis: SCD  HPI/Subjective: Patient has no complaints of nausea or abdominal pain. No further  hematemesis.   Objective: Blood pressure 114/73, pulse 71, temperature 97.9 F (36.6 C), temperature source Oral, resp. rate 24, height 5\' 6"  (1.676 m), weight 76.2 kg (167 lb 15.9 oz), last menstrual period 07/14/2012, SpO2 99.00%.  Intake/Output Summary (Last 24 hours) at 07/20/12 1724 Last data filed at 07/20/12 1700  Gross per 24 hour  Intake 3209.5 ml  Output      5 ml  Net 3204.5 ml     Exam: General: No acute respiratory distress Lungs: Clear to auscultation bilaterally without wheezes or crackles Cardiovascular: Regular rate and rhythm without murmur gallop or rub normal S1 and S2 Abdomen: Nontender, nondistended, soft, bowel sounds positive, no rebound, no ascites, no appreciable mass Extremities: No significant cyanosis, clubbing, or edema bilateral lower extremities  Data Reviewed: Basic Metabolic Panel:  Recent Labs Lab 07/19/12 0910 07/20/12 0630  NA 137 140  K 3.4* 2.9*  CL 96 102  CO2  --  31  GLUCOSE 339* 182*  BUN 23 12  CREATININE 0.90 0.81  CALCIUM  --  8.2*   Liver Function Tests:  Recent Labs Lab 07/20/12 0630  AST 15  ALT 12  ALKPHOS 60  BILITOT 1.5*  PROT 6.1  ALBUMIN 3.2*   No results found for this basename: LIPASE, AMYLASE,  in the last 168 hours No results found for this basename: AMMONIA,  in the last 168 hours CBC:  Recent Labs Lab 07/19/12 0910 07/19/12 0950 07/19/12 1144 07/19/12 1828 07/20/12 0630  WBC  --  7.8  --  8.2 8.0  NEUTROABS  --  5.7  --  4.7 4.5  HGB 8.5* 7.6*  --  5.8* 8.6*  HCT 25.0* 24.1*  --  18.2* 25.2*  MCV  --  74.8*  --  71.9* 75.7*  PLT  --  <5* <5* 50* <5*   Cardiac Enzymes: No results found for this basename: CKTOTAL, CKMB, CKMBINDEX, TROPONINI,  in the last 168 hours BNP (last 3 results) No results found for this basename: PROBNP,  in the last 8760 hours CBG:  Recent Labs Lab 07/19/12 1916 07/19/12 2151 07/20/12 0812 07/20/12 1215 07/20/12 1704  GLUCAP 174* 242* 186* 213* 296*     Recent Results (from the past 240 hour(s))  MRSA PCR SCREENING     Status: None   Collection Time    07/19/12  5:53 PM      Result Value Range Status   MRSA by PCR NEGATIVE  NEGATIVE Final   Comment:            The GeneXpert MRSA Assay (FDA     approved for NASAL specimens     only), is one component of a     comprehensive MRSA colonization     surveillance program. It is not     intended to diagnose MRSA     infection nor to guide or     monitor treatment for     MRSA infections.     Studies:  Recent x-ray studies have been reviewed in detail by the Attending Physician  Scheduled Meds:  Scheduled Meds: . IMMUNE GLOBULIN 10% (HUMAN) IV - For Fluid Restriction Only  1 g/kg Intravenous Q24H  . insulin aspart  0-15 Units Subcutaneous TID WC  . insulin aspart  0-5 Units Subcutaneous QHS  . levothyroxine  50 mcg Oral Q breakfast  . pantoprazole (PROTONIX) IV  40 mg Intravenous Q24H  . predniSONE  20 mg Oral Q1200  . predniSONE  50 mg Oral Q breakfast  . sodium chloride  3 mL Intravenous Q12H   Continuous Infusions: . sodium chloride 50 mL/hr at 07/20/12 1203    Time spent on care of this patient: 35 minutes  [facility]  [facility] Office  [phone] Pager - Text Page per Loretha Stapler as per below:  On-Call/Text Page:      [email or web address]      password [email or web address]  If 7PM-7AM, please contact night-coverage [email or web address] Password [facility] 07/20/2012, 5:24 PM   LOS: 1 day

## 2012-07-20 NOTE — Progress Notes (Signed)
I look at Autumn Acevedo's blood smear. I do not see any evidence of a hematologic malignancy. I cannot find any platelets. Her red cells looked somewhat hypochromic. There is some polychromasia. There may have been 1 nucleated red cell. I saw no schistocytes. White cells appeared normal in morphology. There were no hypersegmented polys or immature myeloid cells. There is no atypical lymphocytes.  With her iron studies, she may be borderline iron deficient. As such, I don't see a problem with giving her IV iron.  She's not bleeding. She does have some ecchymoses. Her platelet count is less than 5. I would NOT transfuse her unless there is bleeding.  She is on prednisone. 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 I don't believe this is Evans syndrome as I don't see any obvious autoimmune hemolytic anemia.  I do believe that adding IVIG will help in this situation. I will give her 2 days of IVIG.  She feels well. There is no cough or shortness of breath. She's had no fever. There's been no change in bowel or bladder habits.  On her physical exam on her vital signs are stable. She has no intraoral ecchymoses, petechia or hematoma. There is no adenopathy in her neck. Lungs are clear bilaterally. Cardiac exam regular rate and rhythm with no murmurs rubs or bruits. Abdominal exam is soft. There is no palpable splenomegaly. Extremities shows no clubbing cyanosis or edema.  As she had an ultrasound done yesterday. There is no splenomegaly.  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 I do not see any evidence of a microangiopathic hemolytic process (i.e. TTP).  Continue the prednisone. At IVIG. Give a dose of IV iron.  I would not transfuse her platelets unless she is bleeding.   Pete E.  Joshua 1:9

## 2012-07-20 NOTE — Progress Notes (Signed)
CRITICAL VALUE ALERT  Critical value received:  Platelets <5  Date of notification:  07-20-2012  Time of notification:  0806  Critical value read back:yes  Nurse who received alert:  Jeanmarie Plant, RN  MD notified (1st page):  Butler Denmark, MD  Time of first page:  0806  MD notified (2nd page): Butler Denmark, MD  Time of second page: 0905  Responding MD: Butler Denmark MD  Time MD responded:  [phone]  No new orders, giving pt steriods and awaiting hematology consult.  Salomon Mast, RN

## 2012-07-20 NOTE — Progress Notes (Signed)
No reaction; leaving rate at 125 until infusion complete.

## 2012-07-20 NOTE — Progress Notes (Signed)
No reaction.

## 2012-07-20 NOTE — Progress Notes (Signed)
No reaction, increasing rate.

## 2012-07-20 NOTE — Progress Notes (Signed)
Turning rate on IVIG up; no reactions reported

## 2012-07-20 NOTE — Progress Notes (Signed)
IVIG started by Freida Busman, IV nurse at 1351.  VS taken pre-admin.  Will infuse over 6 hours, with initial rate at 16 for 15 min, 34 for the next 15 min, 6 for the next 15 min, and then 125 for the rest of the time assuming no reactions occur.  Pt was premedicated with tylenol and benadryl.  Will check VS with each titration up, as well as every hour after infusion gets to 125cc/hr.  Salomon Mast, RN

## 2012-07-20 NOTE — Progress Notes (Signed)
Pre-IVIG administration

## 2012-07-20 NOTE — Progress Notes (Signed)
No reaction. Increasing rate.

## 2012-07-21 DIAGNOSIS — D693 Immune thrombocytopenic purpura: Secondary | ICD-10-CM

## 2012-07-21 DIAGNOSIS — J45909 Unspecified asthma, uncomplicated: Secondary | ICD-10-CM

## 2012-07-21 LAB — CBC WITH DIFFERENTIAL/PLATELET
Basophils Relative: 0 % (ref 0–1)
Eosinophils Absolute: 0 10*3/uL (ref 0.0–0.7)
Eosinophils Relative: 0 % (ref 0–5)
Hemoglobin: 8 g/dL — ABNORMAL LOW (ref 12.0–15.0)
Lymphs Abs: 1.9 10*3/uL (ref 0.7–4.0)
MCH: 25.6 pg — ABNORMAL LOW (ref 26.0–34.0)
MCHC: 33.9 g/dL (ref 30.0–36.0)
MCV: 75.4 fL — ABNORMAL LOW (ref 78.0–100.0)
Monocytes Absolute: 0.7 10*3/uL (ref 0.1–1.0)
Neutrophils Relative %: 75 % (ref 43–77)
RBC: 3.13 MIL/uL — ABNORMAL LOW (ref 3.87–5.11)

## 2012-07-21 LAB — BASIC METABOLIC PANEL
BUN: 9 mg/dL (ref 6–23)
Chloride: 102 mEq/L (ref 96–112)
Chloride: 98 mEq/L (ref 96–112)
GFR calc Af Amer: >90 mL/min (ref >90)
GFR calc Af Amer: >90 mL/min (ref >90)
Potassium: 2.8 mEq/L — ABNORMAL LOW (ref 3.5–5.1)
Potassium: 3.7 mEq/L (ref 3.5–5.1)

## 2012-07-21 LAB — GLUCOSE, CAPILLARY
Glucose-Capillary: 121 mg/dL — ABNORMAL HIGH (ref 70–99)
Glucose-Capillary: 300 mg/dL — ABNORMAL HIGH (ref 70–99)
Glucose-Capillary: 251 mg/dL — ABNORMAL HIGH (ref 70–99)

## 2012-07-21 LAB — MAGNESIUM: Magnesium: 1.8 mg/dL (ref 1.5–2.5)

## 2012-07-21 MED ORDER — POTASSIUM CHLORIDE CRYS ER 20 MEQ PO TBCR
40.0000 meq | EXTENDED_RELEASE_TABLET | ORAL | Status: AC
  Administered 2012-07-21 (×2): 40 meq via ORAL
  Filled 2012-07-21 (×2): qty 2

## 2012-07-21 MED ORDER — INSULIN ASPART 100 UNIT/ML ~~LOC~~ SOLN
4.0000 [IU] | Freq: Three times a day (TID) | SUBCUTANEOUS | Status: DC
  Administered 2012-07-22 – 2012-07-23 (×5): 4 [IU] via SUBCUTANEOUS

## 2012-07-21 MED ORDER — ENSURE COMPLETE PO LIQD
237.0000 mL | Freq: Two times a day (BID) | ORAL | Status: DC
  Administered 2012-07-21 – 2012-07-23 (×4): 237 mL via ORAL

## 2012-07-21 NOTE — Progress Notes (Signed)
IVIG initiated   

## 2012-07-21 NOTE — Progress Notes (Signed)
Utilization Review Completed.Autumn Acevedo T3/30/2014  

## 2012-07-21 NOTE — Progress Notes (Signed)
No reaction, increasing IVIG

## 2012-07-21 NOTE — Progress Notes (Signed)
No reaction, increasing IVIG 

## 2012-07-21 NOTE — Progress Notes (Signed)
IVIG started by Tana Conch nurse. VS taken pre-admin. Will infuse over 6 hours, with initial rate at 16 for 15 min, 34 for the next 15 min, 6 for the next 15 min, and then 125 for the rest of the time assuming no reactions occur. Pt was premedicated with tylenol and benadryl. Will check VS with each titration up, as well as every hour after infusion gets to 125cc/hr.  Salomon Mast, RN

## 2012-07-21 NOTE — Progress Notes (Addendum)
[facility] Progress Note [place] TEAM 1 - Stepdown/ICU TEAM   Autumn Acevedo ZOX:[id number] DOB: 1961-03-12 DOA: 07/19/2012 PCP: Evette Georges, MD  Brief narrative: This is an 52 year old female, with known history of HTN, Goiter/Hypothyroidism, seasonal allergies, query mild bronchial Asthma, seen on 07/05/12, at PMD's office, for 4 days of watery diarrhea, nausea, vomiting, treated with plenty of fluids and prn Zofran. Symptoms resolved in 2-3 days, but patient has been feeling fatigued and has lacked energy since. She has had no respiratory tract symptoms or fever. At about 4:00 AM on 07/19/12, she was woken up, by feeling sick to her stomach. Vomited once, and described the vomitus as "very dark", although she could not say whether there was blood in it. She denies "coffee grounds". There was no abdominal pain. Patient's sister brought her to the ED, where she was found to have HB 8.5-7.6, positive FOBT and platelet count of <5.    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 -Prednisone started 3/29 along with IVIG -Hematology recommends no further platelet transfusions unless patient is bleeding  Hematemesis -Resolved -Continue IV protonix -advance to regular diet in AM    HTN (hypertension) -Noted to be hypotensive likely secondary to blood loss -Holding antihypertensive  Hyperglycemia - cont sliding scale but add mealtime coverage -check A1c    Hypothyroidism -Continue levothyroxine    Asthma Stable  Code Status: Full code Family Communication: None Disposition Plan: Follow in step down unit  Consultants: Hematology  Procedures: None  Antibiotics: None  DVT prophylaxis: SCD  HPI/Subjective: Patient has no complaints- tolerating full  liquids   Objective: Blood pressure 103/71, pulse 79, temperature 98.6 F (37 C), temperature source Oral, resp. rate 17, height 5\' 6"  (1.676 m), weight 73 kg (160 lb 15 oz), last menstrual period 07/14/2012, SpO2 100.00%.  Intake/Output Summary (Last 24 hours) at 07/21/12 1911 Last data filed at 07/21/12 1830  Gross per 24 hour  Intake 3429.5 ml  Output   3200 ml  Net  229.5 ml     Exam: General: No acute respiratory distress Lungs: Clear to auscultation bilaterally without wheezes or crackles Cardiovascular: Regular rate and rhythm without murmur gallop or rub normal S1 and S2 Abdomen: Nontender, nondistended, soft, bowel sounds positive, no rebound, no ascites, no appreciable mass Extremities: No significant cyanosis, clubbing, or edema bilateral lower extremities  Data Reviewed: Basic Metabolic Panel:  Recent Labs Lab 07/19/12 0910 07/20/12 0630 07/21/12 0555 07/21/12 0824 07/21/12 1610  NA 137 140 136  --  131*  K 3.4* 2.9* 2.8*  --  3.7  CL 96 102 102  --  98  CO2  --  31 23  --  23  GLUCOSE 339* 182* 166*  --  280*  BUN 23 12 9   --  10  CREATININE 0.90 0.81 0.68  --  0.75  CALCIUM  --  8.2* 8.2*  --  8.2*  MG  --   --   --  1.8  --    Liver Function Tests:  Recent Labs Lab 07/20/12 0630  AST 15  ALT 12  ALKPHOS 60  BILITOT 1.5*  PROT 6.1  ALBUMIN 3.2*   No results found for this basename: LIPASE, AMYLASE,  in the last 168 hours No results found for this basename: AMMONIA,  in the last 168 hours CBC:  Recent Labs Lab 07/19/12 0910 07/19/12 0950 07/19/12 1144 07/19/12 1828 07/20/12 0630 07/21/12 0555  WBC  --  7.8  --  8.2 8.0 10.6*  NEUTROABS  --  5.7  --  4.7 4.5 8.0*  HGB 8.5* 7.6*  --  5.8* 8.6* 8.0*  HCT 25.0* 24.1*  --  18.2* 25.2* 23.6*  MCV  --  74.8*  --  71.9* 75.7* 75.4*  PLT  --  <5* <5* 50* <5* 8*   Cardiac Enzymes: No results found for this basename: CKTOTAL, CKMB, CKMBINDEX, TROPONINI,  in the last 168 hours BNP (last 3  results) No results found for this basename: PROBNP,  in the last 8760 hours CBG:  Recent Labs Lab 07/20/12 1704 07/20/12 2134 07/21/12 0839 07/21/12 1149 07/21/12 1722  GLUCAP 296* 221* 121* 300* 251*    Recent Results (from the past 240 hour(s))  MRSA PCR SCREENING     Status: None   Collection Time    07/19/12  5:53 PM      Result Value Range Status   MRSA by PCR NEGATIVE  NEGATIVE Final   Comment:            The GeneXpert MRSA Assay (FDA     approved for NASAL specimens     only), is one component of a     comprehensive MRSA colonization     surveillance program. It is not     intended to diagnose MRSA     infection nor to guide or     monitor treatment for     MRSA infections.     Studies:  Recent x-ray studies have been reviewed in detail by the Attending Physician  Scheduled Meds:  Scheduled Meds: . feeding supplement  237 mL Oral BID BM  . insulin aspart  0-15 Units Subcutaneous TID WC  . insulin aspart  0-5 Units Subcutaneous QHS  . levothyroxine  50 mcg Oral Q breakfast  . pantoprazole (PROTONIX) IV  40 mg Intravenous Q24H  . predniSONE  20 mg Oral Q1200  . predniSONE  50 mg Oral Q breakfast  . sodium chloride  3 mL Intravenous Q12H   Continuous Infusions:    Time spent on care of this patient: 25 minutes  [facility]  [facility] Office  [phone] Pager - Text Page per Loretha Stapler as per below:  On-Call/Text Page:      [email or web address]      password [email or web address]  If 7PM-7AM, please contact night-coverage [email or web address] Password [email or web address] 07/21/2012, 7:11 PM   LOS: 2 days

## 2012-07-21 NOTE — Progress Notes (Signed)
IP PROGRESS NOTE  Subjective:   Patient feeling well. No bleeding.  She tolerated IVIG without complications.   Objective:  Vital signs in last 24 hours: Temp:  [97.8 F (36.6 C)-98.3 F (36.8 C)] 98.3 F (36.8 C) (03/30 0457) Pulse Rate:  [66-107] 75 (03/30 0700) Resp:  [12-32] 24 (03/30 0700) BP: (105-138)/(60-75) 105/60 mmHg (03/30 0457) SpO2:  [98 %-100 %] 100 % (03/30 0700) Weight:  [160 lb 15 oz (73 kg)] 160 lb 15 oz (73 kg) (03/30 0457) Weight change: -7 lb 0.9 oz (-3.2 kg) Last BM Date: 07/19/12  Intake/Output from previous day: 03/29 0701 - 03/30 0700 In: 3260 [P.O.:1210; I.V.:1950; IV Piggyback:100] Out: 1904 [Urine:1904]  Mouth: mucous membranes moist, pharynx normal without lesions Resp: clear to auscultation bilaterally Cardio: regular rate and rhythm, S1, S2 normal, no murmur, click, rub or gallop GI: soft, non-tender; bowel sounds normal; no masses,  no organomegaly Extremities: extremities normal, atraumatic, no cyanosis or edema    Lab Results:  Recent Labs  07/20/12 0630 07/21/12 0555  WBC 8.0 10.6*  HGB 8.6* 8.0*  HCT 25.2* 23.6*  PLT <5* 8*    BMET  Recent Labs  07/20/12 0630 07/21/12 0555  NA 140 136  K 2.9* 2.8*  CL 102 102  CO2 31 23  GLUCOSE 182* 166*  BUN 12 9  CREATININE 0.81 0.68  CALCIUM 8.2* 8.2*    Studies/Results: US Abdomen Complete  07/19/2012  *RADIOLOGY REPORT*  Clinical Data:  Thrombocytopenia and anemia.  COMPLETE ABDOMINAL ULTRASOUND  Comparison:  None.  Findings:  Gallbladder:  The gallbladder is not distended and the wall measures up to 4 mm.  There is echogenic material near the gallbladder fundus which could represent a prominent fold but cannot exclude echogenic material, such as sludge or polyp.  Common bile duct:  Common bile duct measures 0.4 cm.  Liver:  The liver parenchyma is slightly echogenic and heterogeneous.  There is no focal liver lesion. The main portal vein is patent.  Difficult to exclude mild  nodularity of the liver contour.  IVC:  Not seen.  Pancreas:  No focal abnormality seen.  Spleen:  The spleen measures 5.6 cm in length.  Right Kidney:  Right kidney has a normal appearance.  The right kidney measures 10.3 cm in length without hydronephrosis.  Left Kidney:  Left kidney has a normal appearance and measures 11.0 cm in length.  No evidence for left hydronephrosis.  Abdominal aorta:  No aneurysm identified.  IMPRESSION:  There is thickening of the gallbladder wall which may be related to under distention.  In particular, the fundus of the gallbladder is markedly thickened and cannot exclude a polyp or even sludge in this area.  No definite gallstones.  The liver parenchyma is heterogeneous and cannot exclude hepatic steatosis.   Original Report Authenticated By: Richarda Overlie, M.D.     Medications: I have reviewed the patient's current medications.  Assessment/Plan:  1. 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 She is on day 3 of prednisone and day 2/2 of IVIG. Her platelets are slightly improving up to 8.  No bleeding is noted. No transfusion is needed unless active bleeding is noted.   Would continue current treatment plan. I anticipate recovery soon.   2. Anemia: No evidence of hemolysis noted. Normal haptoglobin and negative DAT.  Hgb is stable today. She might need po iron supplement upon discharge.     LOS: 2 days   [facility] 07/21/2012, 8:41 AM

## 2012-07-22 DIAGNOSIS — E039 Hypothyroidism, unspecified: Secondary | ICD-10-CM

## 2012-07-22 LAB — HEMOGLOBIN A1C
Hgb A1c MFr Bld: 7.2 % — ABNORMAL HIGH (ref <5.7)
Mean Plasma Glucose: 160 mg/dL — ABNORMAL HIGH (ref <117)

## 2012-07-22 LAB — BASIC METABOLIC PANEL
Chloride: 105 mEq/L (ref 96–112)
Creatinine, Ser: 0.7 mg/dL (ref 0.50–1.10)
GFR calc Af Amer: >90 mL/min (ref >90)
GFR calc non Af Amer: >90 mL/min (ref >90)
Potassium: 3.8 mEq/L (ref 3.5–5.1)

## 2012-07-22 LAB — CBC WITH DIFFERENTIAL/PLATELET
Basophils Absolute: 0 10*3/uL (ref 0.0–0.1)
Lymphocytes Relative: 13 % (ref 12–46)
Monocytes Relative: 7 % (ref 3–12)
Platelets: 49 10*3/uL — ABNORMAL LOW (ref 150–400)
RDW: 17.7 % — ABNORMAL HIGH (ref 11.5–15.5)
WBC: 9.7 10*3/uL (ref 4.0–10.5)

## 2012-07-22 MED ORDER — GLIPIZIDE 10 MG PO TABS: 10.0000 mg | ORAL_TABLET | Freq: Every day | ORAL | Status: DC

## 2012-07-22 MED ORDER — PANTOPRAZOLE SODIUM 40 MG PO TBEC
40.0000 mg | DELAYED_RELEASE_TABLET | Freq: Every day | ORAL | Status: DC
  Administered 2012-07-22 – 2012-07-23 (×2): 40 mg via ORAL
  Filled 2012-07-22 (×2): qty 1

## 2012-07-22 MED ORDER — LISINOPRIL 2.5 MG PO TABS
2.5000 mg | ORAL_TABLET | Freq: Every day | ORAL | Status: DC
  Administered 2012-07-23: 2.5 mg via ORAL
  Filled 2012-07-22: qty 1

## 2012-07-22 MED ORDER — PREDNISONE 50 MG PO TABS
80.0000 mg | ORAL_TABLET | Freq: Every day | ORAL | Status: DC
  Administered 2012-07-23: 80 mg via ORAL
  Filled 2012-07-22 (×2): qty 1

## 2012-07-22 MED ORDER — METFORMIN HCL 500 MG PO TABS
500.0000 mg | ORAL_TABLET | Freq: Two times a day (BID) | ORAL | Status: DC
  Administered 2012-07-22 – 2012-07-23 (×2): 500 mg via ORAL
  Filled 2012-07-22 (×4): qty 1

## 2012-07-22 NOTE — Progress Notes (Signed)
Inpatient Diabetes Program Recommendations  AACE/ADA: New Consensus Statement on Inpatient Glycemic Control (2013)  Target Ranges:  Prepandial:   less than 140 mg/dL      Peak postprandial:   less than 180 mg/dL (1-2 hours)      Critically ill patients:  140 - 180 mg/dL  Glucose values in 161'W and 300's on Prednisone. Noted correction scale and meal coverage ordered. Added carb modifed to assist with glucose control. Noted HgbA1C of 7.2%, however with thrombocytopenia, this value would not be valid.    Thank you, Lenor Coffin, RN, CNS, Diabetes Coordinator [phone])

## 2012-07-22 NOTE — Progress Notes (Signed)
Ms. Sharrow platelets are 49,000!!!! As such, she is beginning to respond to prednisone/IVIG. She tolerated the IVIG quite well.  She also got IV iron on the 30th. She did well with this. Her hemoglobin is 8.4.  She denies any bleeding.  She is beginning to eat better. She's not having any obvious nausea vomiting.  On her physical exam, vital signs are temperature 90.8 pulse 69 respiratory 19 blood pressure 118/79. Lungs are clear bilaterally. Cardiac exam regular rhythm rhythm with a 1/6 systolic ejection murmur. Abdominal exam shows no abdominal mass. There is no fluid wave. There is no palpable hepatospleno megaly. Extremities shows no clubbing cyanosis or edema. Skin does not show any petechia. Neurological exam shows no focal neurological deficits.  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 This must have been a sequela from his viral syndrome that she had. She's responded very nicely to immune suppressive therapy.  I believe that she can go home. I would make sure that she is on prednisone at 80 mg by mouth daily. Please make sure that she has either a PPI or other acid blocker to take with the prednisone.  I will be more than happy to follow her up in my office in one week. I will make the arrangements for that. We can then taper her prednisone as needed.  We did have an excellent prayer session this morning.!!!  Hewitt Shorts  1 John 1:7-9

## 2012-07-22 NOTE — Progress Notes (Signed)
[facility] Progress Note [place] TEAM 1 - Stepdown/ICU TEAM   Autumn Acevedo AOZ:[id number] DOB: July 27, 1960 DOA: 07/19/2012 PCP: Evette Georges, MD  Brief narrative: 52 year old female with history of HTN, Goiter/Hypothyroidism, seasonal allergies, query mild bronchial Asthma, seen on 07/05/12 at PMD's office for 4 days of watery diarrhea, nausea, vomiting, treated with plenty of fluids and prn Zofran. Symptoms resolved in 2-3 days, but patient had been feeling fatigued since. She had no respiratory tract symptoms or fever. At about 4:00 AM on 07/19/12, she woke up feeling sick to her stomach, vomited once, and described the vomitus as "very dark", although she could not say whether there was blood in it. She denied "coffee grounds". There was no abdominal pain. Patient's sister brought her to the ED, where she was found to have Hgb 8.5-7.6, positive FOBT and platelet count of <5.   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 Myna Hidalgo one week post d/c   Hematemesis -Resolved -Continue protonix -currently tolerating regular diet   Hx of HTN (hypertension) -Noted to be hypotensive likely secondary to blood loss -Holding antihypertensive -initiating low dose ACE - follow BP and crt as oupt - consider adding low dose diuretic as oupt in f/u if ACE tolerated  Hyperglycemia > newly diagnosed DM -cont sliding scale + mealtime coverage - add oral agent in AM -A1c consistent with true DM -begin to educate pt - may require med tx  after d/c, especially in that pt will require ongoing prednisone initially  -add low dose ACE - avoid ASA due to plt issues  Hypothyroidism -Continue levothyroxine  Asthma -Stable  Code Status: Full code Family Communication: None Disposition Plan: SDU (plan for d/c in AM so will not transfer)  Consultants: Hematology  Procedures: None  Antibiotics: None  DVT prophylaxis: SCD  HPI/Subjective: Patient is tolerating her diet thus far.  Has not yet been up out of bed.  Feels weak in general.  Denies n/v, cp, sob, or ha.  Objective: Blood pressure 112/72, pulse 63, temperature 98 F (36.7 C), temperature source Oral, resp. rate 20, height 5\' 6"  (1.676 m), weight 73 kg (160 lb 15 oz), last menstrual period 07/14/2012, SpO2 99.00%.  Intake/Output Summary (Last 24 hours) at 07/22/12 1655 Last data filed at 07/22/12 1200  Gross per 24 hour  Intake    726 ml  Output   1825 ml  Net  -1099 ml     Exam: General: No acute respiratory distress Lungs: Clear to auscultation bilaterally without wheezes or crackles Cardiovascular: Regular rate and rhythm without murmur gallop or rub Abdomen: Nontender, nondistended, soft, bowel sounds positive, no rebound, no ascites, no appreciable mass Extremities: No significant cyanosis, clubbing, or edema bilateral lower extremities  Data Reviewed: Basic Metabolic Panel:  Recent Labs Lab 07/19/12 0910 07/20/12 0630 07/21/12 0555 07/21/12 0824 07/21/12 1610 07/22/12 0555  NA 137 140 136  --  131* 137  K 3.4* 2.9* 2.8*  --  3.7 3.8  CL 96 102 102  --  98 105  CO2  --  31 23  --  23 25  GLUCOSE 339* 182* 166*  --  280* 173*  BUN 23 12 9   --  10 9  CREATININE 0.90 0.81 0.68  --  0.75 0.70  CALCIUM  --  8.2* 8.2*  --  8.2* 8.5  MG  --   --   --  1.8  --   --    Liver Function Tests:  Recent Labs Lab 07/20/12 0630  AST 15  ALT 12  ALKPHOS 60  BILITOT 1.5*  PROT 6.1  ALBUMIN 3.2*   CBC:  Recent Labs Lab 07/19/12 0950  07/19/12 1144 07/19/12 1828 07/20/12 0630 07/21/12 0555 07/22/12 0555  WBC 7.8  --  8.2 8.0 10.6* 9.7  NEUTROABS 5.7  --  4.7 4.5 8.0* 7.7  HGB 7.6*  --  5.8* 8.6* 8.0* 8.4*  HCT 24.1*  --  18.2* 25.2* 23.6* 25.9*  MCV 74.8*  --  71.9* 75.7* 75.4* 79.0  PLT <5* <5* 50* <5* 8* 49*   CBG:  Recent Labs Lab 07/21/12 1149 07/21/12 1722 07/21/12 2138 07/22/12 0821 07/22/12 1247  GLUCAP 300* 251* 288* 138* 282*    Recent Results (from the past 240 hour(s))  MRSA PCR SCREENING     Status: None   Collection Time    07/19/12  5:53 PM      Result Value Range Status   MRSA by PCR NEGATIVE  NEGATIVE Final   Comment:            The GeneXpert MRSA Assay (FDA     approved for NASAL specimens     only), is one component of a     comprehensive MRSA colonization     surveillance program. It is not     intended to diagnose MRSA     infection nor to guide or     monitor treatment for     MRSA infections.     Studies:  Recent x-ray studies have been reviewed in detail by the Attending Physician  Scheduled Meds:  Scheduled Meds: . feeding supplement  237 mL Oral BID BM  . insulin aspart  0-15 Units Subcutaneous TID WC  . insulin aspart  0-5 Units Subcutaneous QHS  . insulin aspart  4 Units Subcutaneous TID WC  . levothyroxine  50 mcg Oral Q breakfast  . lisinopril  2.5 mg Oral Daily  . metFORMIN  500 mg Oral BID WC  . pantoprazole  40 mg Oral Daily  . [START ON 07/23/2012] predniSONE  80 mg Oral Q breakfast  . sodium chloride  3 mL Intravenous Q12H   Continuous Infusions:    Time spent on care of this patient: 35 minutes  [facility] T  [facility] Office  [phone] Pager - Text Page per Loretha Stapler as per below:  On-Call/Text Page:      [email or web address]      password [email or web address]  If 7PM-7AM, please contact night-coverage [email or web address] Password [email or web address] 07/22/2012, 4:55 PM   LOS: 3 days

## 2012-07-23 ENCOUNTER — Other Ambulatory Visit: Payer: Self-pay | Admitting: Hematology & Oncology

## 2012-07-23 ENCOUNTER — Telehealth: Payer: Self-pay | Admitting: Hematology & Oncology

## 2012-07-23 DIAGNOSIS — J45909 Unspecified asthma, uncomplicated: Secondary | ICD-10-CM

## 2012-07-23 DIAGNOSIS — D649 Anemia, unspecified: Secondary | ICD-10-CM

## 2012-07-23 LAB — CBC WITH DIFFERENTIAL/PLATELET
Basophils Absolute: 0 10*3/uL (ref 0.0–0.1)
Eosinophils Absolute: 0 10*3/uL (ref 0.0–0.7)
Lymphocytes Relative: 19 % (ref 12–46)
MCHC: 33.6 g/dL (ref 30.0–36.0)
Neutro Abs: 9.5 10*3/uL — ABNORMAL HIGH (ref 1.7–7.7)
Neutrophils Relative %: 74 % (ref 43–77)
RDW: 18.2 % — ABNORMAL HIGH (ref 11.5–15.5)

## 2012-07-23 LAB — GLUCOSE, CAPILLARY: Glucose-Capillary: 119 mg/dL — ABNORMAL HIGH (ref 70–99)

## 2012-07-23 MED ORDER — LISINOPRIL 2.5 MG PO TABS: 2.5000 mg | ORAL_TABLET | Freq: Every day | ORAL | Status: DC

## 2012-07-23 MED ORDER — LIVING WELL WITH DIABETES BOOK
Freq: Once | Status: AC
  Administered 2012-07-23: 12:00:00
  Filled 2012-07-23: qty 1

## 2012-07-23 MED ORDER — FREESTYLE SYSTEM KIT: 1.0000 | PACK | Status: DC | PRN

## 2012-07-23 MED ORDER — PANTOPRAZOLE SODIUM 40 MG PO TBEC: 40.0000 mg | DELAYED_RELEASE_TABLET | Freq: Every day | ORAL | Status: DC

## 2012-07-23 MED ORDER — PREDNISONE 20 MG PO TABS: 80.0000 mg | ORAL_TABLET | Freq: Every day | ORAL | Status: DC

## 2012-07-23 MED ORDER — METFORMIN HCL 500 MG PO TABS: 500.0000 mg | ORAL_TABLET | Freq: Two times a day (BID) | ORAL | Status: DC

## 2012-07-23 NOTE — Discharge Summary (Signed)
Physician Discharge Summary  Autumn Acevedo JYN:[id number] DOB: June 03, 1960 DOA: 07/19/2012  PCP: Evette Georges, MD  Admit date: 07/19/2012 Discharge date: 07/29/2012  Time spent: >45 minutes   Discharge Diagnoses:  Active Problems:   Anemia   Thrombocytopenia   Vomiting   HTN (hypertension)   Hypothyroidism   Asthma   Discharge Condition: stable  Diet recommendation: heart healthy  Filed Weights   07/19/12 1700 07/20/12 0425 07/21/12 0457  Weight: 167 lb 15.9 oz (76.2 kg) 167 lb 15.9 oz (76.2 kg) 160 lb 15 oz (73 kg)    History of present illness:  52 year old female with history of HTN, Goiter/Hypothyroidism, seasonal allergies, query mild bronchial Asthma, seen on 07/05/12 at PMD's office for 4 days of watery diarrhea, nausea, vomiting, treated with plenty of fluids and prn Zofran. Symptoms resolved in 2-3 days, but patient had been feeling fatigued since. She had no respiratory tract symptoms or fever. At about 4:00 AM on 07/19/12, she woke up feeling sick to her stomach, vomited once, and described the vomitus as "very dark", although she could not say whether there was blood in it. She denied "coffee grounds". There was no abdominal pain. Patient's sister brought her to the ED, where she was found to have Hgb 8.5-7.6, positive FOBT and platelet count of <5.    Hospital Course:  Anemia due to acute blood loss  -Volume replaced with 2 units of packed red blood cells with an appropriate rise in hemoglobin noted -Hgb has been stable since  Thrombocytopenia  -Likely ITP - being managed by hematology  -Prednisone started 3/29 along with IVIG  -to continue prednisone 80mg  daily (along w/ PPI)  -f/u w/ Dr. Myna Hidalgo one week post d/c   Hematemesis  -Resolved and likely related to mild gastritis in the setting of severe thrombocytopenia -Continue OTC prilosec as needed for GI symptoms of pain/reflux  -currently tolerating regular diet without any symptoms  Hx of HTN  (hypertension)  -Noted to be hypotensive likely secondary to blood loss  -Holding antihypertensive  -initiating low dose ACE - follow BP and crt as oupt - consider adding low dose diuretic as oupt in f/u if ACE tolerated   Hyperglycemia > newly diagnosed DM  -cont sliding scale + mealtime coverage - add oral agent in AM  -A1c consistent with true DM  -begin to educate pt - may require med tx after d/c, especially in that pt will require ongoing prednisone initially  -add low dose ACE - avoid ASA due to plt issues   Hypothyroidism  -Continue levothyroxine   Asthma  -Stable  Consultations:  hematology  Discharge Exam: Filed Vitals:   07/23/12 0450 07/23/12 0741 07/23/12 1200 07/23/12 1234  BP:  133/81 130/77   Pulse:  92 64   Temp: 98.5 F (36.9 C) 98.3 F (36.8 C)  97.8 F (36.6 C)  TempSrc: Oral Oral Oral Oral  Resp:  35 20   Height:      Weight:      SpO2:  98% 100%     General: alert, oreinted x 3, no acute distress Cardiovascular: RRR, no murmurs Respiratory: CTA b/l  Discharge Instructions      Discharge Orders   Future Appointments Provider Department Dept Phone   08/02/2012 11:00 AM Sharlotte Alamo [facility] [phone]   08/02/2012 11:30 AM [place] [facility] [phone]   08/27/2012 2:45 PM Roderick Pee, MD [facility]  at [place] [phone]   Future Orders Complete By Expires     Ambulatory referral to Nutrition and Diabetic Education  As directed     Comments:      New onset diabetes; A1C 7.2%.  Needs diet education.    Diet - low sodium heart healthy  As directed     Comments:      Carb controlled, heart healthy    Discharge instructions  As directed     Comments:      You will need a Bmet in 1-2 weeks as we have started you on Lisnopril    Increase activity slowly  As directed         Medication List    STOP taking these medications       aspirin 81 MG  chewable tablet     atenolol-chlorthalidone 50-25 MG per tablet  Commonly known as:  TENORETIC      TAKE these medications       albuterol 108 (90 BASE) MCG/ACT inhaler  Commonly known as:  PROVENTIL HFA;VENTOLIN HFA  Inhale 2 puffs into the lungs every 6 (six) hours as needed for wheezing.     glucose monitoring kit monitoring kit  1 each by Does not apply route as needed for other.     levothyroxine 50 MCG tablet  Commonly known as:  SYNTHROID, LEVOTHROID  Take 50 mcg by mouth daily.     multivitamin with minerals Tabs  Take 1 tablet by mouth daily.     ondansetron 4 MG tablet  Commonly known as:  ZOFRAN  Take 4 mg by mouth every 8 (eight) hours as needed for nausea.     predniSONE 20 MG tablet  Commonly known as:  DELTASONE  Take 4 tablets (80 mg total) by mouth daily with breakfast.       Follow-up Information   Follow up with TODD,JEFFREY ALLEN, MD. Schedule an appointment as soon as possible for a visit in 1 week. (have Bmet done)    Contact information:   [address] [place] [address] [phone]       Follow up with Josph Macho, MD. Schedule an appointment as soon as possible for a visit in 1 week. (see him on Monday)    Contact information:   [address] [place] [address] [phone]        The results of significant diagnostics from this hospitalization (including imaging, microbiology, ancillary and laboratory) are listed below for reference.    Significant Diagnostic Studies: US Abdomen Complete  07/19/2012  *RADIOLOGY REPORT*  Clinical Data:  Thrombocytopenia and anemia.  COMPLETE ABDOMINAL ULTRASOUND  Comparison:  None.  Findings:  Gallbladder:  The gallbladder is not distended and the wall measures up to 4 mm.  There is echogenic material near the gallbladder fundus which could represent a prominent fold but cannot exclude echogenic material, such as sludge or polyp.  Common bile duct:  Common bile duct  measures 0.4 cm.  Liver:  The liver parenchyma is slightly echogenic and heterogeneous.  There is no focal liver lesion. The main portal vein is patent.  Difficult to exclude mild nodularity of the liver contour.  IVC:  Not seen.  Pancreas:  No focal abnormality seen.  Spleen:  The spleen measures 5.6 cm in length.  Right Kidney:  Right kidney has a normal appearance.  The right kidney measures 10.3 cm in length without hydronephrosis.  Left Kidney:  Left kidney has a normal appearance  and measures 11.0 cm in length.  No evidence for left hydronephrosis.  Abdominal aorta:  No aneurysm identified.  IMPRESSION:  There is thickening of the gallbladder wall which may be related to under distention.  In particular, the fundus of the gallbladder is markedly thickened and cannot exclude a polyp or even sludge in this area.  No definite gallstones.  The liver parenchyma is heterogeneous and cannot exclude hepatic steatosis.   Original Report Authenticated By: Richarda Overlie, M.D.     Microbiology: Recent Results (from the past 240 hour(s))  MRSA PCR SCREENING     Status: None   Collection Time    07/19/12  5:53 PM      Result Value Range Status   MRSA by PCR NEGATIVE  NEGATIVE Final   Comment:            The GeneXpert MRSA Assay (FDA     approved for NASAL specimens     only), is one component of a     comprehensive MRSA colonization     surveillance program. It is not     intended to diagnose MRSA     infection nor to guide or     monitor treatment for     MRSA infections.     Labs: Basic Metabolic Panel:  Recent Labs Lab 07/29/12 0913  NA 136  K 4.4  CL 101  CO2 25  GLUCOSE 174*  BUN 17  CREATININE 1.1  CALCIUM 8.9   Liver Function Tests: No results found for this basename: AST, ALT, ALKPHOS, BILITOT, PROT, ALBUMIN,  in the last 168 hours No results found for this basename: LIPASE, AMYLASE,  in the last 168 hours No results found for this basename: AMMONIA,  in the last 168  hours CBC:  Recent Labs Lab 07/23/12 0442 07/29/12 0913  WBC 12.8* 12.9*  NEUTROABS 9.5* 11.0*  HGB 8.7* 10.7*  HCT 25.9* 33.6*  MCV 78.2 83.0  PLT 76* 27.0 Repeated and verified X2.*   Cardiac Enzymes: No results found for this basename: CKTOTAL, CKMB, CKMBINDEX, TROPONINI,  in the last 168 hours BNP: BNP (last 3 results) No results found for this basename: PROBNP,  in the last 8760 hours CBG:  Recent Labs Lab 07/22/12 1708 07/22/12 2101 07/23/12 0732 07/23/12 1207  GLUCAP 224* 266* 119* 269*       Signed:  Matsue Strom  [facility] 07/29/2012, 3:00 PM

## 2012-07-23 NOTE — Telephone Encounter (Signed)
Patient called to sch apt.  Per MD orders to sch patient in 1 week.  Patient sch apt 08/02/12

## 2012-07-23 NOTE — Plan of Care (Signed)
Problem: Food- and Nutrition-Related Knowledge Deficit (NB-1.1) Goal: Nutrition education Formal process to instruct or train a patient/client in a skill or to impart knowledge to help patients/clients voluntarily manage or modify food choices and eating behavior to maintain or improve health. Outcome: Completed/Met Date Met:  07/23/12  RD consulted for nutrition education regarding diabetes. Dietary recall reveals fairly healthy eating with balanced meals. Discussed implementing snacks and choosing sugar-free beverages. Pt is very receptive to all nutrition advice and appreciative of RD visit.    Lab Results  Component Value Date    HGBA1C 7.2* 07/22/2012    RD provided "Carbohydrate Counting for People with Diabetes" handout from the Academy of Nutrition and Dietetics. Discussed different food groups and their effects on blood sugar, emphasizing carbohydrate-containing foods. Provided list of carbohydrates and recommended serving sizes of common foods.  Discussed importance of controlled and consistent carbohydrate intake throughout the day. Provided examples of ways to balance meals/snacks and encouraged intake of high-fiber, whole grain complex carbohydrates. Teach back method used.  Expect good compliance.  Body mass index is 25.99 kg/(m^2). Pt meets criteria for overweight based on current BMI.  Current diet order is Carbohydrate Modified Medium, patient is consuming approximately 50-75% of meals at this time. Labs and medications reviewed. No further nutrition interventions warranted at this time. RD contact information provided. If additional nutrition issues arise, please re-consult RD.  Jarold Motto MS, RD, LDN Pager: [phone] After-hours pager: [phone]

## 2012-07-23 NOTE — Progress Notes (Signed)
Pt discharged to home; discharge instructions given with prescriptions; all questions answered.

## 2012-07-23 NOTE — Plan of Care (Signed)
Problem: Consults Goal: Diabetes Mellitus Patient Education See Patient Education Module for education specifics. Outcome: Progressing Pt watching educational videos  Goal: Diagnosis-Diabetes Mellitus New Onset Type II  Goal: Nutrition Consult-if indicated Outcome: Progressing Dietician saw patient on 07-23-12

## 2012-07-23 NOTE — Progress Notes (Signed)
Inpatient Diabetes Program Recommendations  AACE/ADA: New Consensus Statement on Inpatient Glycemic Control (2013)  Target Ranges:  Prepandial:   less than 140 mg/dL      Peak postprandial:   less than 180 mg/dL (1-2 hours)      Critically ill patients:  140 - 180 mg/dL   Reason for consult:  Referral   Note: Upon entering the room, the patient was watching diabetes education video and taking notes. Spoke with pt about new diabetes diagnosis. Discussed A1C results with her (7.2%) and explained what an A1C is, basic pathophysiology of DM Type 2, basic home care, importance of checking CBGs and maintaining good CBG control to prevent long-term and short-term complications. Reviewed signs and symptoms of hyperglycemia and hypoglycemia along with proper treatment for both. Reviewed Living Well with Diabetes booklet with patient.   Patient is eager to learn and very receptive to diabetes education. RNs to provide ongoing basic DM education at bedside with this patient.  Patient verbalized understanding of information discussed and states that she does not have any questions or concerns at this time.  Encouraged patient to review the material provided and write down any questions she may have and I will plan to follow up with her tomorrow to clarify any questions and ensure she feels comfortable with going home and managing diabetes.  Informed the patient about outpatient diabetes education and patient is willing to go.  I have entered an order for outpatient diabetes education in the computer, MD needs to co-sign order.    Thanks, Orlando Penner, RN, BSN, CCRN Diabetes Coordinator Inpatient Diabetes Program [phone]

## 2012-07-29 ENCOUNTER — Encounter: Payer: Self-pay | Admitting: Family Medicine

## 2012-07-29 ENCOUNTER — Ambulatory Visit (INDEPENDENT_AMBULATORY_CARE_PROVIDER_SITE_OTHER): Payer: Managed Care, Other (non HMO) | Admitting: Family Medicine

## 2012-07-29 VITALS — BP 122/80 | HR 79 | Temp 99.4°F | Wt 172.0 lb

## 2012-07-29 DIAGNOSIS — E119 Type 2 diabetes mellitus without complications: Secondary | ICD-10-CM

## 2012-07-29 DIAGNOSIS — I10 Essential (primary) hypertension: Secondary | ICD-10-CM

## 2012-07-29 DIAGNOSIS — E139 Other specified diabetes mellitus without complications: Secondary | ICD-10-CM | POA: Insufficient documentation

## 2012-07-29 DIAGNOSIS — D696 Thrombocytopenia, unspecified: Secondary | ICD-10-CM

## 2012-07-29 DIAGNOSIS — D649 Anemia, unspecified: Secondary | ICD-10-CM

## 2012-07-29 DIAGNOSIS — R1110 Vomiting, unspecified: Secondary | ICD-10-CM

## 2012-07-29 LAB — CBC WITH DIFFERENTIAL/PLATELET
Basophils Absolute: 0 10*3/uL (ref 0.0–0.1)
Basophils Relative: 0.2 % (ref 0.0–3.0)
Eosinophils Relative: 0.2 % (ref 0.0–5.0)
HCT: 33.6 % — ABNORMAL LOW (ref 36.0–46.0)
Hemoglobin: 10.7 g/dL — ABNORMAL LOW (ref 12.0–15.0)
Lymphs Abs: 1.4 10*3/uL (ref 0.7–4.0)
Monocytes Relative: 3.9 % (ref 3.0–12.0)
Neutro Abs: 11 10*3/uL — ABNORMAL HIGH (ref 1.4–7.7)
RDW: 23.5 % — ABNORMAL HIGH (ref 11.5–14.6)

## 2012-07-29 LAB — BASIC METABOLIC PANEL
GFR: 70.2 mL/min (ref >60.00)
Glucose, Bld: 174 mg/dL — ABNORMAL HIGH (ref 70–99)
Potassium: 4.4 mEq/L (ref 3.5–5.1)
Sodium: 136 mEq/L (ref 135–145)

## 2012-07-29 MED ORDER — PANTOPRAZOLE SODIUM 40 MG PO TBEC: 40.0000 mg | DELAYED_RELEASE_TABLET | Freq: Every day | ORAL | Status: DC

## 2012-07-29 MED ORDER — METFORMIN HCL 500 MG PO TABS: 500.0000 mg | ORAL_TABLET | Freq: Two times a day (BID) | ORAL | Status: DC

## 2012-07-29 MED ORDER — LISINOPRIL 2.5 MG PO TABS: 2.5000 mg | ORAL_TABLET | Freq: Every day | ORAL | Status: DC

## 2012-07-29 NOTE — Progress Notes (Signed)
  Subjective:    Patient ID: Autumn Acevedo, female    DOB: 1960/12/05, 52 y.o.   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 Her Hgb bottomed out at 5.8 and was up to 8.7 by discharge, and her platelets were as low as <5 and were up to 76 by discharge. She received transfusions of PRBC and platelets as well as IV iron. Dr. Myna Hidalgo was consulted and he ordered her to get steroids and IVIG. She was sent home on 80 mg a day of prednisone. Her DC weight was 160 and today she is back up to 172. Her nausea and vomiting and diarrhea have totally resolved, her BMs are back to normal, and her appetite is normal. She was also newly diagnosed with type 2 diabetes. Her admission glucose was 339 and her a1c was 7.2. She met with nutritionists, and she was started on Metformin and Lisinopril. She feels great today and wants to go back to work. 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    Review of Systems  Constitutional: Negative.   Respiratory: Negative.   Cardiovascular: Negative.   Gastrointestinal: Negative.        Objective:   Physical Exam  Constitutional: She appears well-developed and well-nourished.  Neck:  She has large goiter (chronic)  Cardiovascular: Normal rate, regular rhythm, normal heart sounds and intact distal pulses.   Pulmonary/Chest: Effort normal and breath sounds normal.  Abdominal: Soft. Bowel sounds are normal. She exhibits no distension and no mass. There is no tenderness. There is no rebound and no guarding.  No HSM   Lymphadenopathy:    She has no cervical adenopathy.          Assessment & Plan:  She seems to have recovered from the enteritis and we will follow the autoimmune disorder. Get a BMET and a CBC today. I did write her a note to return to work today with no restrictions. She  will continue the Prednisone until she follows up with Dr. Myna Hidalgo, and she will see him on 08-02-12. She will follow up with Dr. Tawanna Cooler on 08-27-12.

## 2012-08-01 NOTE — Progress Notes (Signed)
Quick Note:  I spoke with pt ______ 

## 2012-08-02 ENCOUNTER — Telehealth: Payer: Self-pay | Admitting: Hematology & Oncology

## 2012-08-02 ENCOUNTER — Ambulatory Visit (HOSPITAL_BASED_OUTPATIENT_CLINIC_OR_DEPARTMENT_OTHER): Payer: Managed Care, Other (non HMO) | Admitting: Medical

## 2012-08-02 ENCOUNTER — Other Ambulatory Visit (HOSPITAL_BASED_OUTPATIENT_CLINIC_OR_DEPARTMENT_OTHER): Payer: Managed Care, Other (non HMO) | Admitting: Lab

## 2012-08-02 VITALS — BP 132/84 | HR 76 | Temp 98.0°F | Resp 16 | Ht 66.0 in | Wt 173.0 lb

## 2012-08-02 DIAGNOSIS — D649 Anemia, unspecified: Secondary | ICD-10-CM

## 2012-08-02 DIAGNOSIS — D693 Immune thrombocytopenic purpura: Secondary | ICD-10-CM

## 2012-08-02 LAB — CBC WITH DIFFERENTIAL (CANCER CENTER ONLY)
BASO%: 0.1 % (ref 0.0–2.0)
EOS%: 0 % (ref 0.0–7.0)
HCT: 35.7 % (ref 34.8–46.6)
LYMPH%: 12.3 % — ABNORMAL LOW (ref 14.0–48.0)
MCH: 26.2 pg (ref 26.0–34.0)
MCHC: 31.4 g/dL — ABNORMAL LOW (ref 32.0–36.0)
MCV: 84 fL (ref 81–101)
MONO%: 3.2 % (ref 0.0–13.0)
NEUT%: 84.4 % — ABNORMAL HIGH (ref 39.6–80.0)
RDW: 19.8 % — ABNORMAL HIGH (ref 11.1–15.7)

## 2012-08-02 MED ORDER — PREDNISONE 20 MG PO TABS: 60.0000 mg | ORAL_TABLET | Freq: Every day | ORAL | Status: DC

## 2012-08-02 NOTE — Telephone Encounter (Signed)
Left pt message with 4-23 appointment

## 2012-08-02 NOTE — Progress Notes (Signed)
Diagnosis: Anemia, Immune based thrombocytopenia.  Most likely secondary from a viral syndrome.  Current therapy: Prednisone 80 mg by mouth daily.  Interim history: This is a Autumn Acevedo, 52 year old, female, who was recently seen in the hospital for immune-based thrombocytopenia.  She had prior to admission been diagnosed with viral gastro-enteritis.  When she was admitted.  She was noted to have a platelet count less than 5000.  She was transfused with 2 units of packed red blood cells, placed on prednisone and given, IVIG.  Upon discharge, her platelet count had come up to 76,000.  Of note, she was having, hematemesis. upon admission, and that had resolved a the time of discharge.  She was placed on prednisone 80 mg to take by mouth daily.  She is also on protonic for GI prophylaxis.  She's here today in her platelet count is 146,000.  She really is doing well.  She's not reporting any GI symptoms from the prednisone.  She has had some weight gain, secondary to the, prednisone.  She does not report, that she is retaining any fluid.  She has a good appetite.  She denies any obvious, bleeding such as nosebleeds, gum bleeding, melena, hematochezia, or hematemesis.  She denies any nausea, vomiting, diarrhea, constipation, chest pain, shortness of breath, or cough.  She denies any fevers, chills, or night sweats.  She denies any abdominal pain, any obvious, or abnormal bleeding.  She denies any headaches, visual changes, or rashes.  Review of Systems: Constitutional:Negative for malaise/fatigue, fever, chills, weight loss, diaphoresis, activity change, appetite change, and unexpected weight change.  HEENT: Negative for double vision, blurred vision, visual loss, ear pain, tinnitus, congestion, rhinorrhea, epistaxis sore throat or sinus disease, oral pain/lesion, tongue soreness Respiratory: Negative for cough, chest tightness, shortness of breath, wheezing and stridor.  Cardiovascular: Negative for chest pain,  palpitations, leg swelling, orthopnea, PND, DOE or claudication Gastrointestinal: Negative for nausea, vomiting, abdominal pain, diarrhea, constipation, blood in stool, melena, hematochezia, abdominal distention, anal bleeding, rectal pain, anorexia and hematemesis.  Genitourinary: Negative for dysuria, frequency, hematuria,  Musculoskeletal: Negative for myalgias, back pain, joint swelling, arthralgias and gait problem.  Skin: Negative for rash, color change, pallor and wound.  Neurological:. Negative for dizziness/light-headedness, tremors, seizures, syncope, facial asymmetry, speech difficulty, weakness, numbness, headaches and paresthesias.  Hematological: Negative for adenopathy. Does not bruise/bleed easily.  Psychiatric/Behavioral:  Negative for depression, no loss of interest in normal activity or change in sleep pattern.   Physical Exam: This is a Autumn Acevedo, 52 year old, [name], female, in no obvious distress. Vitals: Temperature 98.0 degrees, pulse 76, respirations 16, blood pressure 132/84.  Weight 173 pounds HEENT reveals a normocephalic, atraumatic skull, no scleral icterus, no oral lesions  Neck is supple without any cervical or supraclavicular adenopathy.  Lungs are clear to auscultation bilaterally. There are no wheezes, rales or rhonci Cardiac is regular rate and rhythm with a normal S1 and S2. There are no murmurs, rubs, or bruits.  Abdomen is soft with good bowel sounds, there is no palpable mass. There is no palpable hepatosplenomegaly. There is no palpable fluid wave.  Musculoskeletal no tenderness of the spine, ribs, or hips.  Extremities there are no clubbing, cyanosis, or edema.  Skin no petechia, purpura or ecchymosis Neurologic is nonfocal.  Laboratory Data: White count 10.6, hemoglobin 1.2, hematocrit 35.7, platelets 146,000  Current Outpatient Prescriptions on File Prior to Visit  Medication Sig Dispense Refill  . albuterol (PROVENTIL HFA;VENTOLIN HFA)  108 (90 BASE) MCG/ACT inhaler Inhale 2 puffs  into the lungs every 6 (six) hours as needed for wheezing.      Marland Kitchen glucose monitoring kit (FREESTYLE) monitoring kit 1 each by Does not apply route as needed for other.  1 each  0  . levothyroxine (SYNTHROID, LEVOTHROID) 50 MCG tablet Take 50 mcg by mouth daily.      Marland Kitchen lisinopril (PRINIVIL,ZESTRIL) 2.5 MG tablet Take 1 tablet (2.5 mg total) by mouth daily.  30 tablet  0  . metFORMIN (GLUCOPHAGE) 500 MG tablet Take 1 tablet (500 mg total) by mouth 2 (two) times daily with a meal.  60 tablet  0  . Multiple Vitamin (MULTIVITAMIN WITH MINERALS) TABS Take 1 tablet by mouth daily.      . pantoprazole (PROTONIX) 40 MG tablet Take 1 tablet (40 mg total) by mouth daily.  30 tablet  0  . ondansetron (ZOFRAN) 4 MG tablet Take 4 mg by mouth every 8 (eight) hours as needed for nausea.      . predniSONE (DELTASONE) 20 MG tablet Take 4 tablets (80 mg total) by mouth daily with breakfast.  40 tablet  0   No current facility-administered medications on file prior to visit.   Assessment/Plan: This is a Autumn Acevedo, 52 year old, F., [name], with the following issues:  #1.  Anemia, and based thrombocytopenia.  This was most likely sequela from viral syndrome.  Her platelet count has come up nicely, since she's been on the prednisone.  We will go ahead and decrease her prednisone from 80 mg down to 60 mg daily.  We will follow back up with her in 2 weeks' time.  If her platelet count remained stable.  We can continue the prednisone taper.  #2.  Followup.  We will follow back up with Autumn Acevedo in 2 weeks, but before then should there be questions or concerns.

## 2012-08-06 ENCOUNTER — Telehealth: Payer: Self-pay | Admitting: Family Medicine

## 2012-08-06 MED ORDER — FREESTYLE LITE TEST VI STRP: 1.0000 | ORAL_STRIP | Freq: Three times a day (TID) | Status: DC

## 2012-08-06 NOTE — Telephone Encounter (Signed)
Patient was dx as diabetic in hospital. Sent home with meter and strips, but no rx for the strips. Uses [facility] on [address]. Pt requesting rx for Freestyle Lite strips - has the Freestyle Lite meter.  Patient tests 3 times a day. Patient has enough strips for today only. Please advise.

## 2012-08-06 NOTE — Telephone Encounter (Signed)
New Rx sent to pharmacy

## 2012-08-13 ENCOUNTER — Encounter: Payer: Managed Care, Other (non HMO) | Attending: Internal Medicine | Admitting: *Deleted

## 2012-08-13 VITALS — Ht 66.0 in | Wt 173.6 lb

## 2012-08-13 DIAGNOSIS — Z713 Dietary counseling and surveillance: Secondary | ICD-10-CM | POA: Insufficient documentation

## 2012-08-13 DIAGNOSIS — E119 Type 2 diabetes mellitus without complications: Secondary | ICD-10-CM | POA: Insufficient documentation

## 2012-08-14 ENCOUNTER — Other Ambulatory Visit (HOSPITAL_BASED_OUTPATIENT_CLINIC_OR_DEPARTMENT_OTHER): Payer: Managed Care, Other (non HMO) | Admitting: Lab

## 2012-08-14 ENCOUNTER — Ambulatory Visit (HOSPITAL_BASED_OUTPATIENT_CLINIC_OR_DEPARTMENT_OTHER): Payer: Managed Care, Other (non HMO) | Admitting: Hematology & Oncology

## 2012-08-14 VITALS — BP 142/91 | HR 78 | Temp 98.2°F | Resp 16 | Ht 66.0 in | Wt 174.0 lb

## 2012-08-14 DIAGNOSIS — R7309 Other abnormal glucose: Secondary | ICD-10-CM

## 2012-08-14 DIAGNOSIS — D693 Immune thrombocytopenic purpura: Secondary | ICD-10-CM

## 2012-08-14 LAB — CBC WITH DIFFERENTIAL (CANCER CENTER ONLY)
BASO%: 0.2 % (ref 0.0–2.0)
Eosinophils Absolute: 0 10*3/uL (ref 0.0–0.5)
HCT: 38.7 % (ref 34.8–46.6)
LYMPH#: 4 10*3/uL — ABNORMAL HIGH (ref 0.9–3.3)
MCV: 83 fL (ref 81–101)
MONO#: 0.7 10*3/uL (ref 0.1–0.9)
NEUT%: 63.8 % (ref 39.6–80.0)
RBC: 4.69 10*6/uL (ref 3.70–5.32)
RDW: 16.8 % — ABNORMAL HIGH (ref 11.1–15.7)
WBC: 13.2 10*3/uL — ABNORMAL HIGH (ref 3.9–10.0)

## 2012-08-14 NOTE — Progress Notes (Signed)
This office note has been dictated.

## 2012-08-15 NOTE — Progress Notes (Signed)
CC:   Autumn Gens A. Tawanna Cooler, MD  DIAGNOSIS:  Chronic immune-based thrombocytopenia.  CURRENT THERAPY:  Prednisone 60 mg p.o. daily. Patient to go down to 40 mg a day now.  INTERIM HISTORY:  Autumn Acevedo comes in for her followup.  She is doing quite well.  She is back to work.  She has had no problems with respect to bleeding or bruising.  She is doing well with the prednisone.  She has not had any kind of mouth sores.  There are no problems with nausea or vomiting.  She has had no fevers, sweats, or chills.  She does have some hyperglycemia from the prednisone.  She is on metformin for this.  She has had no leg swelling.  There have been no rashes.  There have been no ecchymoses.  Overall, her performance status is ECOG 0.  PHYSICAL EXAMINATION:  General:  This is a well-developed, well- nourished [name] in no obvious distress.  Vital signs: Temperature of 98.2, pulse 78, respiratory rate 18, blood pressure is 142/91.  Weight is 174.  Head and neck:  Normocephalic, atraumatic skull.  There are no ocular or oral lesions.  There are no palpable cervical or supraclavicular lymph nodes.  Lungs:  Clear bilaterally. Cardiac:  Regular rate and rhythm with a normal S1 and S2.  There are no murmurs, rubs, or bruits.  Abdomen:  Soft with good bowel sounds.  There is no palpable abdominal mass.  There is no fluid wave.  There is no palpable hepatosplenomegaly.  Extremities:  No clubbing, cyanosis, or edema.  Skin:  No rashes, ecchymosis, or petechia.  Neurological:  No focal neurological deficits.  LABORATORY STUDIES:  White cell count is 13.2, hemoglobin 12.3, hematocrit 38.7, platelet count 263.  MCV is 83.  IMPRESSION:  Autumn Acevedo is a very charming 52 year old [name].  She actually went to high school with my wife.  She presented with chronic immune thrombocytopenia.  This likely was from a viral syndrome that she had contracted a week or so before.  We  will get her prednisone dose down to 40 mg a day now.  We will continue to follow her blood work along.  We will plan to get her back in 2 weeks.  If her platelet count continues to stabilize, then we will get her prednisone dose down even further.  We will have her come back in 2 weeks for followup.    ______________________________ Josph Macho, M.D. PRE/MEDQ  D:  08/14/2012  T:  08/15/2012  Job:  [id number]

## 2012-08-16 ENCOUNTER — Encounter: Payer: Self-pay | Admitting: *Deleted

## 2012-08-16 NOTE — Progress Notes (Signed)
Patient was seen on 08/13/2012 for the first of a series of three diabetes self-management courses at the [facility]. Patient's most recent A1c was 7.2 % on 07/22/2012 The following learning objectives were met by the patient during this course:   Defines the role of glucose and insulin  Identifies type of diabetes and pathophysiology  Defines the diagnostic criteria for diabetes and prediabetes  States the risk factors for Type 2 Diabetes  States the symptoms of Type 2 Diabetes  Defines Type 2 Diabetes treatment goals  Defines Type 2 Diabetes treatment options  States the rationale for glucose monitoring  Identifies A1C, glucose targets, and testing times  Identifies proper sharps disposal  Defines the purpose of a diabetes food plan  Identifies carbohydrate food groups  Defines effects of carbohydrate foods on glucose levels  Identifies carbohydrate choices/grams/food labels  States benefits of physical activity and effect on glucose  Review of suggested activity guidelines  Handouts given during class include:  Type 2 Diabetes: Basics Book  My Food Plan Book  Food and Activity Log   Follow-Up Plan: Core Class 2

## 2012-08-16 NOTE — Patient Instructions (Signed)
Goals:  Follow Diabetes Meal Plan as instructed  Eat 3 meals and 2 snacks, every 3-5 hrs  Limit carbohydrate intake to 30-45 grams carbohydrate/meal  Limit carbohydrate intake to 15 grams carbohydrate/snack  Add lean protein foods to meals/snacks  Monitor glucose levels as instructed by your doctor  Aim for 15-30 mins of physical activity daily  Bring food record and glucose log to your next nutrition visit   

## 2012-08-20 ENCOUNTER — Ambulatory Visit (INDEPENDENT_AMBULATORY_CARE_PROVIDER_SITE_OTHER): Payer: Managed Care, Other (non HMO) | Admitting: Family Medicine

## 2012-08-20 ENCOUNTER — Encounter: Payer: Self-pay | Admitting: Family Medicine

## 2012-08-20 VITALS — BP 164/100 | Temp 99.1°F | Wt 171.0 lb

## 2012-08-20 DIAGNOSIS — M5126 Other intervertebral disc displacement, lumbar region: Secondary | ICD-10-CM

## 2012-08-20 DIAGNOSIS — M5116 Intervertebral disc disorders with radiculopathy, lumbar region: Secondary | ICD-10-CM | POA: Insufficient documentation

## 2012-08-20 MED ORDER — HYDROCODONE-ACETAMINOPHEN 7.5-300 MG PO TABS: ORAL_TABLET | ORAL | Status: DC

## 2012-08-20 MED ORDER — DIAZEPAM 2 MG PO TABS: ORAL_TABLET | ORAL | Status: DC

## 2012-08-20 NOTE — Patient Instructions (Signed)
Bedrest at home  Vicodin and Valium,,,,,,,,,,,, one of each 3 times daily  Milk of magnesia  Return on Thursday for followup

## 2012-08-20 NOTE — Progress Notes (Signed)
  Subjective:    Patient ID: Autumn Acevedo, female    DOB: August 02, 1960, 52 y.o.   MRN: [id number]  HPI Autumn Acevedo is a 52 year old married female nonsmoker who comes in today for evaluation of low back pain  She states on Monday she got out of bed and had the sudden onset of left hip pain radiating down to her left calf. She feels numbness in that leg. Her pain is constant dull an 8 on a scale of 1-10. It is diminished by rest and increased by sitting or standing.  No bowel nor bladder dysfunction. No history of trauma or previous low back pain.   Review of Systems Neuromuscular review of systems negative    Objective:   Physical Exam Well-developed well-nourished female no acute distress in the supine position both legs were of equal length. Sensation muscle strength reflexes within normal limits. Positive straight leg raising left 45  Abdominal exam normal     Assessment & Plan:

## 2012-08-22 ENCOUNTER — Ambulatory Visit (INDEPENDENT_AMBULATORY_CARE_PROVIDER_SITE_OTHER): Payer: Managed Care, Other (non HMO) | Admitting: Family Medicine

## 2012-08-22 ENCOUNTER — Encounter: Payer: Self-pay | Admitting: Family Medicine

## 2012-08-22 VITALS — BP 130/98

## 2012-08-22 DIAGNOSIS — M5126 Other intervertebral disc displacement, lumbar region: Secondary | ICD-10-CM

## 2012-08-22 DIAGNOSIS — M5116 Intervertebral disc disorders with radiculopathy, lumbar region: Secondary | ICD-10-CM

## 2012-08-22 MED ORDER — PANTOPRAZOLE SODIUM 40 MG PO TBEC: 40.0000 mg | DELAYED_RELEASE_TABLET | Freq: Every day | ORAL | Status: DC

## 2012-08-22 MED ORDER — METFORMIN HCL 500 MG PO TABS: 500.0000 mg | ORAL_TABLET | Freq: Two times a day (BID) | ORAL | Status: DC

## 2012-08-22 MED ORDER — LISINOPRIL 2.5 MG PO TABS: 2.5000 mg | ORAL_TABLET | Freq: Every day | ORAL | Status: DC

## 2012-08-22 NOTE — Progress Notes (Signed)
  Subjective:    Patient ID: Autumn Acevedo, female    DOB: [birth year], 52 y.o.   MRN: [id number]  HPI Autumn Acevedo is a 52 year old female who comes in today for followup of back pain  We saw her earlier in the week with the sudden onset of severe back pain that began when she got out of bed. No history of trauma. Neurologic exam was normal. We placed at bedrest and started on a combination of Valium and Vicodin. She states she's about 40% better. No neurologic symptoms   Review of Systems    review of systems negative Objective:   Physical Exam Well-developed well-nourished female no acute distress neurologic examination number peaked       Assessment & Plan:  Lumbar disc disease improving with rest and medication ambulatory over the weekend decrease medication return when necessary

## 2012-08-22 NOTE — Patient Instructions (Signed)
Decrease the Valium and Vicodin,,,,,,,,,,,, one half to one of each twice daily  Walk,,,,,,,,,,,, lie down,,,,,,,,,,, over the next 3 days  Go back to work on Monday  When you go back to work only take the Vicodin and Valium one half to one of each at bedtime  Return when necessary

## 2012-08-27 ENCOUNTER — Encounter: Payer: Self-pay | Admitting: Family Medicine

## 2012-08-27 ENCOUNTER — Ambulatory Visit (INDEPENDENT_AMBULATORY_CARE_PROVIDER_SITE_OTHER): Payer: Managed Care, Other (non HMO) | Admitting: Family Medicine

## 2012-08-27 VITALS — BP 130/90 | Temp 98.5°F | Ht 65.5 in | Wt 172.0 lb

## 2012-08-27 DIAGNOSIS — J309 Allergic rhinitis, unspecified: Secondary | ICD-10-CM

## 2012-08-27 DIAGNOSIS — D693 Immune thrombocytopenic purpura: Secondary | ICD-10-CM | POA: Insufficient documentation

## 2012-08-27 DIAGNOSIS — M5126 Other intervertebral disc displacement, lumbar region: Secondary | ICD-10-CM

## 2012-08-27 DIAGNOSIS — E039 Hypothyroidism, unspecified: Secondary | ICD-10-CM

## 2012-08-27 DIAGNOSIS — E049 Nontoxic goiter, unspecified: Secondary | ICD-10-CM

## 2012-08-27 DIAGNOSIS — E669 Obesity, unspecified: Secondary | ICD-10-CM

## 2012-08-27 DIAGNOSIS — M5116 Intervertebral disc disorders with radiculopathy, lumbar region: Secondary | ICD-10-CM

## 2012-08-27 DIAGNOSIS — Q828 Other specified congenital malformations of skin: Secondary | ICD-10-CM

## 2012-08-27 DIAGNOSIS — E119 Type 2 diabetes mellitus without complications: Secondary | ICD-10-CM

## 2012-08-27 DIAGNOSIS — I10 Essential (primary) hypertension: Secondary | ICD-10-CM

## 2012-08-27 MED ORDER — LEVOTHYROXINE SODIUM 100 MCG PO TABS: 100.0000 ug | ORAL_TABLET | Freq: Every day | ORAL | Status: DC

## 2012-08-27 MED ORDER — LISINOPRIL 2.5 MG PO TABS: 2.5000 mg | ORAL_TABLET | Freq: Every day | ORAL | Status: DC

## 2012-08-27 MED ORDER — ALBUTEROL SULFATE HFA 108 (90 BASE) MCG/ACT IN AERS: 2.0000 | INHALATION_SPRAY | Freq: Four times a day (QID) | RESPIRATORY_TRACT | Status: DC | PRN

## 2012-08-27 MED ORDER — METFORMIN HCL 500 MG PO TABS: 500.0000 mg | ORAL_TABLET | Freq: Two times a day (BID) | ORAL | Status: DC

## 2012-08-27 MED ORDER — PANTOPRAZOLE SODIUM 40 MG PO TBEC: 40.0000 mg | DELAYED_RELEASE_TABLET | Freq: Every day | ORAL | Status: DC

## 2012-08-27 NOTE — Progress Notes (Signed)
  Subjective:    Patient ID: Autumn Acevedo, female    DOB: [birth year], 52 y.o.   MRN: [id number]  HPI kammi is a 52 year old female nonsmoker who comes in today for general physical examination because of a history of asthma, goiter, hypertension, diabetes, reflux esophagitis, and a recent episode of lumbar disc disease with severe pain  Her medications reviewed and there've been no changes. She's currently on a tapering dose of prednisone. This is increased her blood sugar which we expected.  She history 10 eye care, dental care, BSE monthly call and you mammography, she is due to have her first colonoscopy.  She had a period last month was just some spotting now she is bleeding heavily. We'll have her come back in 2 weeks for a pelvic exam   Review of Systems  Constitutional: Negative.   HENT: Negative.   Eyes: Negative.   Respiratory: Negative.   Cardiovascular: Negative.   Gastrointestinal: Negative.   Genitourinary: Negative.   Musculoskeletal: Negative.   Neurological: Negative.   Psychiatric/Behavioral: Negative.        Objective:   Physical Exam  Constitutional: She appears well-developed and well-nourished.  HENT:  Head: Normocephalic and atraumatic.  Right Ear: External ear normal.  Left Ear: External ear normal.  Nose: Nose normal.  Mouth/Throat: Oropharynx is clear and moist.  Eyes: EOM are normal. Pupils are equal, round, and reactive to light.  Neck: Normal range of motion. Neck supple. No thyromegaly present.  Cardiovascular: Normal rate, regular rhythm, normal heart sounds and intact distal pulses.  Exam reveals no gallop and no friction rub.   No murmur heard. Pulmonary/Chest: Effort normal and breath sounds normal.  Abdominal: Soft. Bowel sounds are normal. She exhibits no distension and no mass. There is no tenderness. There is no rebound.  Genitourinary: Vagina normal and uterus normal. Guaiac negative stool. No vaginal discharge found.  Bilateral breast  exam normal except for multiple small fibrocystic changes most prominent in the right breast at 12:00 there's about 3 or 4 the small rubbery movable.  Musculoskeletal: Normal range of motion.  Lymphadenopathy:    She has no cervical adenopathy.  Neurological: She is alert. She has normal reflexes. No cranial nerve deficit. She exhibits normal muscle tone. Coordination normal.  Skin: Skin is warm and dry.  Psychiatric: She has a normal mood and affect. Her behavior is normal. Judgment and thought content normal.          Assessment & Plan:  Healthy female  Diabetes type 2 continue metformin 500 twice a day  Hypertension continue lisinopril daily  Lumbar disc disease resolving taper prednisone  Goiter increase Synthroid 200 mcg daily to see if we can shrink the goiter down even though she tells me has not increased in size and she's asymptomatic.   Reflux esophagitis continue Protonix  And occasional asthma albuterol when necessary  ITP currently followed by Dr. Theron Arista e.,,,,,,,,,,,, tapering prednisone

## 2012-08-27 NOTE — Patient Instructions (Signed)
Increase the Synthroid to 100 mcg daily  Continue other meds as outlined  Taper your prednisone as outlined  Return in 6 months for followup on your blood sugar  Labs one week prior  Remember to do a thorough breast exam monthly and get annual mammogram  We'll set you up for your first screening colonoscopy

## 2012-08-28 ENCOUNTER — Other Ambulatory Visit (HOSPITAL_BASED_OUTPATIENT_CLINIC_OR_DEPARTMENT_OTHER): Payer: Managed Care, Other (non HMO) | Admitting: Lab

## 2012-08-28 ENCOUNTER — Ambulatory Visit (HOSPITAL_BASED_OUTPATIENT_CLINIC_OR_DEPARTMENT_OTHER): Payer: Managed Care, Other (non HMO) | Admitting: Medical

## 2012-08-28 ENCOUNTER — Other Ambulatory Visit: Payer: Self-pay | Admitting: Medical

## 2012-08-28 VITALS — BP 136/79 | HR 90 | Temp 98.1°F | Resp 16 | Ht 65.0 in | Wt 173.0 lb

## 2012-08-28 DIAGNOSIS — D693 Immune thrombocytopenic purpura: Secondary | ICD-10-CM

## 2012-08-28 DIAGNOSIS — R7309 Other abnormal glucose: Secondary | ICD-10-CM

## 2012-08-28 LAB — CBC WITH DIFFERENTIAL (CANCER CENTER ONLY)
BASO%: 0.1 % (ref 0.0–2.0)
Eosinophils Absolute: 0 10*3/uL (ref 0.0–0.5)
LYMPH#: 1.6 10*3/uL (ref 0.9–3.3)
MONO#: 0.3 10*3/uL (ref 0.1–0.9)
Platelets: 255 10*3/uL (ref 145–400)
RBC: 4.85 10*6/uL (ref 3.70–5.32)
RDW: 15.4 % (ref 11.1–15.7)
WBC: 9.9 10*3/uL (ref 3.9–10.0)

## 2012-08-28 LAB — BASIC METABOLIC PANEL - CANCER CENTER ONLY
CO2: 27 mEq/L (ref 18–33)
Chloride: 98 mEq/L (ref 98–108)
Potassium: 3.7 mEq/L (ref 3.3–4.7)
Sodium: 134 mEq/L (ref 128–145)

## 2012-08-28 LAB — CHCC SATELLITE - SMEAR

## 2012-08-28 NOTE — Progress Notes (Signed)
This office note has been dictated.

## 2012-08-28 NOTE — Progress Notes (Signed)
DIAGNOSIS:  Chronic immune-based thrombocytopenia.  CURRENT THERAPY:  Prednisone 40mg  daily. (Prednisone taper)  INTERIM HISTORY: [name] presents today for an office followup visit.  Overall, she is doing quite well.  She remains on prednisone 40 mg daily.  Her platelet count is excellent today.  We will decrease her prednisone from 40 mg down to 20 mg.  She does have diabetes in her blood glucose has been elevated more than usual.  This is secondary to the, prednisone.  She is on metformin.  She's not reporting any type of mouth sores.  She's not having any GI distress.  She's not reporting any bleeding or bruising.  She's not reporting any lower leg swelling.  She has a good appetite.  She denies any nausea, vomiting, diarrhea, constipation, chest pain, shortness of breath, or cough.  She denies any fevers, chills, or night sweats, any obvious, or abnormal bleeding.  She denies any headaches, visual changes, or rashes.  Overall, her performance status is ECOG 1.  Review of Systems: Constitutional:Negative for malaise/fatigue, fever, chills, weight loss, diaphoresis, activity change, appetite change, and unexpected weight change.  HEENT: Negative for double vision, blurred vision, visual loss, ear pain, tinnitus, congestion, rhinorrhea, epistaxis sore throat or sinus disease, oral pain/lesion, tongue soreness Respiratory: Negative for cough, chest tightness, shortness of breath, wheezing and stridor.  Cardiovascular: Negative for chest pain, palpitations, leg swelling, orthopnea, PND, DOE or claudication Gastrointestinal: Negative for nausea, vomiting, abdominal pain, diarrhea, constipation, blood in stool, melena, hematochezia, abdominal distention, anal bleeding, rectal pain, anorexia and hematemesis.  Genitourinary: Negative for dysuria, frequency, hematuria,  Musculoskeletal: Negative for myalgias, back pain, joint swelling, arthralgias and gait problem.  Skin: Negative for rash, color  change, pallor and wound.  Neurological:. Negative for dizziness/light-headedness, tremors, seizures, syncope, facial asymmetry, speech difficulty, weakness, numbness, headaches and paresthesias.  Hematological: Negative for adenopathy. Does not bruise/bleed easily.  Psychiatric/Behavioral:  Negative for depression, no loss of interest in normal activity or change in sleep pattern.   Physical Exam: [name], 52 year old, of up well-nourished, [name], female, in no obvious distress Vitals: Temperature 98.1 degrees, pulse 90, respirations 16, blood pressure 136/79, weight 173 pounds HEENT reveals a normocephalic, atraumatic skull, no scleral icterus, no oral lesions  Neck is supple without any cervical or supraclavicular adenopathy.  Lungs are clear to auscultation bilaterally. There are no wheezes, rales or rhonci Cardiac is regular rate and rhythm with a normal S1 and S2. There are no murmurs, rubs, or bruits.  Abdomen is soft with good bowel sounds, there is no palpable mass. There is no palpable hepatosplenomegaly. There is no palpable fluid wave.  Musculoskeletal no tenderness of the spine, ribs, or hips.  Extremities there are no clubbing, cyanosis, or edema.  Skin no petechia, purpura or ecchymosis Neurologic is nonfocal.  Laboratory Data: White count 9.9, hemoglobin 12.7, hematocrit 39.1, platelets 255,000  Current Outpatient Prescriptions on File Prior to Visit  Medication Sig Dispense Refill  . FREESTYLE LITE test strip 1 each by Other route 3 (three) times daily.  100 each  12  . levothyroxine (SYNTHROID, LEVOTHROID) 100 MCG tablet Take 1 tablet (100 mcg total) by mouth daily.  90 tablet  3  . lisinopril (PRINIVIL,ZESTRIL) 2.5 MG tablet Take 1 tablet (2.5 mg total) by mouth daily.  100 tablet  3  . metFORMIN (GLUCOPHAGE) 500 MG tablet Take 1 tablet (500 mg total) by mouth 2 (two) times daily with a meal.  200 tablet  3  .  Multiple Vitamin (MULTIVITAMIN WITH  MINERALS) TABS Take 1 tablet by mouth daily.      . pantoprazole (PROTONIX) 40 MG tablet Take 1 tablet (40 mg total) by mouth daily.  100 tablet  3   No current facility-administered medications on file prior to visit.   Assessment/Plan: [name], 52 year old, [name], female, with the following issues:  #1.  Chronic immune thrombocytopenia.  This was likely secondary from a viral syndrome.  Her platelets are excellent.  We will decrease her prednisone from 40 mg down to 20 mg.  We will continue to follow her blood work along.  I suspect her platelet, count will continue to stabilize, and we can continue to further taper her prednisone.  #2.   Diabetes/hyperglycemia .  Her blood glucose level is more elevated, secondary to the, prednisone.  She is on metformin.  #3.  Followup.  We will follow back up with [name] in 2 weeks, but before then should there be questions or concerns.

## 2012-09-03 ENCOUNTER — Encounter: Payer: Managed Care, Other (non HMO) | Attending: Internal Medicine | Admitting: Dietician

## 2012-09-03 DIAGNOSIS — Z713 Dietary counseling and surveillance: Secondary | ICD-10-CM | POA: Insufficient documentation

## 2012-09-03 DIAGNOSIS — E119 Type 2 diabetes mellitus without complications: Secondary | ICD-10-CM | POA: Insufficient documentation

## 2012-09-04 NOTE — Progress Notes (Signed)
  Patient was seen on 09/03/2012 for the second of a series of three diabetes self-management courses at the [facility]. The following learning objectives were met by the patient during this course:   Explain basic nutrition maintenance and quality assurance  Describe causes, symptoms and treatment of hypoglycemia and hyperglycemia  Explain how to manage diabetes during illness  Describe the importance of good nutrition for health and healthy eating strategies  List strategies to follow meal plan when dining out  Describe the effects of alcohol on glucose and how to use it safely  Describe problem solving skills for day-to-day glucose challenges  Describe strategies to use when treatment plan needs to change  Identify important factors involved in successful weight loss  Describe ways to remain physically active  Describe the impact of regular activity on insulin resistance  Handouts given in class:  Refrigerator magnet for Sick Day Guidelines  [facility] Oral medication/insulin handout  Nutritional Strategies for Weight Loss with Diabetes  Follow-Up Plan: Patient will attend the final class of the ADA Diabetes Self-Care Education.   

## 2012-09-10 ENCOUNTER — Ambulatory Visit (INDEPENDENT_AMBULATORY_CARE_PROVIDER_SITE_OTHER): Payer: Managed Care, Other (non HMO) | Admitting: Family Medicine

## 2012-09-10 ENCOUNTER — Encounter: Payer: Self-pay | Admitting: Family Medicine

## 2012-09-10 ENCOUNTER — Other Ambulatory Visit (HOSPITAL_COMMUNITY)
Admission: RE | Admit: 2012-09-10 | Discharge: 2012-09-10 | Disposition: A | Discharge status: Home / Self Care | Payer: Managed Care, Other (non HMO) | Source: Ambulatory Visit | Attending: Family Medicine | Admitting: Family Medicine

## 2012-09-10 DIAGNOSIS — Z01419 Encounter for gynecological examination (general) (routine) without abnormal findings: Secondary | ICD-10-CM | POA: Insufficient documentation

## 2012-09-10 DIAGNOSIS — Z0000 Encounter for general adult medical examination without abnormal findings: Secondary | ICD-10-CM

## 2012-09-10 NOTE — Progress Notes (Signed)
  Subjective:    Patient ID: Autumn Acevedo, female    DOB: 1961-03-13, 52 y.o.   MRN: [id number]  HPI Autumn Acevedo is a 52 year old female who comes in today for a Pap smear  We saw her a week ago for general physical however she was having her period at the time therefore we deferred her Pap smear today.   Review of Systems Review of systems negative    Objective:   Physical Exam  Well-developed well-nourished female no acute distress external genitalia within normal limits vaginal vault was normal except somewhat dry,,,,,,,, she's asymptomatic,,,,,,, bimanual exam normal Pap smear was done rectal normal stool guaiac-negative      Assessment & Plan:  Normal pelvic and rectal examination return in one year sooner if any problem

## 2012-09-10 NOTE — Patient Instructions (Signed)
Return in one year sooner if any problems

## 2012-09-11 ENCOUNTER — Other Ambulatory Visit (HOSPITAL_BASED_OUTPATIENT_CLINIC_OR_DEPARTMENT_OTHER): Payer: Managed Care, Other (non HMO) | Admitting: Lab

## 2012-09-11 ENCOUNTER — Ambulatory Visit (HOSPITAL_BASED_OUTPATIENT_CLINIC_OR_DEPARTMENT_OTHER): Payer: Managed Care, Other (non HMO) | Admitting: Hematology & Oncology

## 2012-09-11 VITALS — BP 129/86 | HR 80 | Temp 98.1°F | Resp 16 | Ht 65.0 in | Wt 172.0 lb

## 2012-09-11 DIAGNOSIS — D693 Immune thrombocytopenic purpura: Secondary | ICD-10-CM

## 2012-09-11 LAB — MORPHOLOGY - CHCC SATELLITE: PLT EST ~~LOC~~: ADEQUATE

## 2012-09-11 LAB — CBC WITH DIFFERENTIAL (CANCER CENTER ONLY)
Eosinophils Absolute: 0.1 10*3/uL (ref 0.0–0.5)
HGB: 12.7 g/dL (ref 11.6–15.9)
LYMPH%: 33.7 % (ref 14.0–48.0)
MCV: 79 fL — ABNORMAL LOW (ref 81–101)
MONO#: 0.7 10*3/uL (ref 0.1–0.9)
Platelets: 239 10*3/uL (ref 145–400)
RBC: 4.98 10*6/uL (ref 3.70–5.32)
WBC: 11.9 10*3/uL — ABNORMAL HIGH (ref 3.9–10.0)

## 2012-09-11 LAB — BASIC METABOLIC PANEL - CANCER CENTER ONLY
BUN, Bld: 14 mg/dL (ref 7–22)
CO2: 26 mEq/L (ref 18–33)
Chloride: 100 mEq/L (ref 98–108)
Glucose, Bld: 198 mg/dL — ABNORMAL HIGH (ref 73–118)
Potassium: 3.7 mEq/L (ref 3.3–4.7)
Sodium: 135 mEq/L (ref 128–145)

## 2012-09-11 NOTE — Progress Notes (Signed)
This office note has been dictated.

## 2012-09-12 NOTE — Progress Notes (Signed)
CC:   Tinnie Gens A. Tawanna Cooler, MD  DIAGNOSIS:  Chronic immune thrombocytopenia.  CURRENT THERAPY:  Prednisone 20 mg p.o. daily currently.  She will be tapered down to 10 mg p.o. daily.  INTERIM HISTORY:  Ms. Wulf comes in for followup.  She is doing well. She has had no problems with her prednisone.  She has had no fevers, sweats, or chills.  There have been no rashes.  There has been no bleeding or bruising.  There has been no change in bowel or bladder habits.  She has had no cough.  Her blood sugars have been a little on the high side.  She is being watched, I think, by Dr. Tawanna Cooler.  PHYSICAL EXAMINATION:  General:  This is a well-developed, well- nourished [name] in no obvious distress.  Vital signs: Temperature of 98.1, pulse 80, respiratory rate 16, blood pressure 129/85.  Weight is 172.  Head and neck:  Normocephalic, atraumatic skull.  There are no ocular or oral lesions.  There are no palpable cervical or supraclavicular lymph nodes.  Lungs:  Clear bilaterally. Cardiac:  Regular rate and rhythm, with a normal S1 and S2.  There are no murmurs, rubs, or bruits.  Abdomen:  Soft.  She has good bowel sounds.  There is no fluid wave.  There is no palpable hepatosplenomegaly.  Back:  Shows no tenderness over the spine, ribs, or hips.  Extremities:  Show no clubbing, cyanosis or edema.  Neurological: Shows no focal neurological deficits.  Skin:  No rashes, ecchymosis, or petechia.  LABORATORY STUDIES:  White cell count is 11.9, hemoglobin 12.7, hematocrit 39.3, platelet count 239,000.  IMPRESSION:  Ms. Schwering is a very nice 52 year old [name] with chronic immune thrombocytopenia.  She has responded well to prednisone.  We did give her a dose of IVIG in the hospital.  We will go ahead and plan to move her prednisone down to 10 mg a day.  We will see her back in 1 month.  We will check her blood work at  that time.    ______________________________ Josph Macho, M.D. PRE/MEDQ  D:  09/11/2012  T:  09/12/2012  Job:  [id number]

## 2012-09-17 ENCOUNTER — Encounter: Payer: Managed Care, Other (non HMO) | Admitting: Dietician

## 2012-09-17 DIAGNOSIS — E119 Type 2 diabetes mellitus without complications: Secondary | ICD-10-CM

## 2012-09-18 NOTE — Progress Notes (Signed)
  Patient was seen on 09/17/2012 for the third of a series of three diabetes self-management courses at the [facility]. The following learning objectives were met by the patient during this course:  HT: 66 in  WT: 173.6 lb  A1C: 7.2%  (07/22/2012)    Describe how diabetes changes over time   Identify diabetes complications and ways to prevent them   Describe strategies that can promote heart health including lowering blood pressure and cholesterol   Describe strategies to lower dietary fat and sodium in the diet   Identify physical activities that benefit cardiovascular health   Evaluate success in meeting personal goal   Describe the belief that they can live successfully with diabetes day to day   Establish 2-3 goals that they will plan to diligently work on until they return for the free 51-month follow-up visit  The following handouts were given in class:  3 Month Follow Up Visit handout  Goal setting handout  Class evaluation form  Your patient has established the following 3 month goals for diabetes self-care:   Count Carbohydrates at most of my meals and snacks  Increase my activity (for example, take the stairs) at least 4 days a week  Follow-Up Plan: Patient will attend a 4 month follow-up visit for diabetes self-management education.

## 2012-09-26 ENCOUNTER — Ambulatory Visit (INDEPENDENT_AMBULATORY_CARE_PROVIDER_SITE_OTHER): Payer: Managed Care, Other (non HMO) | Admitting: Family Medicine

## 2012-09-26 ENCOUNTER — Encounter: Payer: Self-pay | Admitting: Family Medicine

## 2012-09-26 ENCOUNTER — Ambulatory Visit (INDEPENDENT_AMBULATORY_CARE_PROVIDER_SITE_OTHER)
Admission: RE | Admit: 2012-09-26 | Discharge: 2012-09-26 | Disposition: A | Discharge status: Home / Self Care | Payer: Managed Care, Other (non HMO) | Source: Ambulatory Visit | Attending: Family Medicine | Admitting: Family Medicine

## 2012-09-26 VITALS — BP 130/90 | Temp 98.8°F | Wt 174.0 lb

## 2012-09-26 DIAGNOSIS — M5116 Intervertebral disc disorders with radiculopathy, lumbar region: Secondary | ICD-10-CM

## 2012-09-26 DIAGNOSIS — M5126 Other intervertebral disc displacement, lumbar region: Secondary | ICD-10-CM

## 2012-09-26 NOTE — Patient Instructions (Signed)
Vicodin and Valium......... One half of each at bedtime when necessary  Go to the main office now for x-rays of your spine  I will call you the report ASAP

## 2012-09-26 NOTE — Progress Notes (Signed)
  Subjective:    Patient ID: Autumn Acevedo, female    DOB: Nov 05, 1960, 52 y.o.   MRN: [id number]  HPI Autumn Acevedo is a 52 year old female nonsmoker who has a history of underlying allergic rhinitis, asthma, hypothyroidism, hypertension, mild diabetes, reflux esophagitis, and ITP who comes in today for evaluation of pain in her left leg  We saw about a month ago with severe back pain the back pain resolved with symptomatic therapy a week after back pain resolve she began having pain in her left anterior thigh. She describes the pain as constant dull a 6 on a scale of 1-10. It's decreased by sitting and increased by walking and lying down. No history of trauma. The pain in her left anterior thigh radiates down to her mid calf.  She's had no numbness or weakness or change in gait or muscle strength. No history of trauma.   Review of Systems Review of systems otherwise negative no bowel or bladder dysfunction no fever etc.    Objective:   Physical Exam  Well-developed well-nourished female no acute distress examination the legs in the supine position shows that of equal length. Sensation muscle strength normal there is decreased knee and ankle reflex on the left. The present but diminished. Pulses normal skin normal straight leg raising negative abdominal exam normal spine exam normal no palpable tenderness      Assessment & Plan:  Left anterior thigh pain radiating to posterior left calf,,,,,,,,,,,,, x-ray spine go from there

## 2012-10-03 ENCOUNTER — Ambulatory Visit: Payer: Managed Care, Other (non HMO) | Admitting: Family Medicine

## 2012-10-16 ENCOUNTER — Other Ambulatory Visit (HOSPITAL_BASED_OUTPATIENT_CLINIC_OR_DEPARTMENT_OTHER): Payer: Managed Care, Other (non HMO) | Admitting: Lab

## 2012-10-16 ENCOUNTER — Ambulatory Visit (HOSPITAL_BASED_OUTPATIENT_CLINIC_OR_DEPARTMENT_OTHER): Payer: Managed Care, Other (non HMO) | Admitting: Hematology & Oncology

## 2012-10-16 VITALS — BP 140/90 | HR 85 | Temp 98.4°F | Resp 16 | Ht 65.0 in | Wt 179.0 lb

## 2012-10-16 DIAGNOSIS — D693 Immune thrombocytopenic purpura: Secondary | ICD-10-CM

## 2012-10-16 LAB — BASIC METABOLIC PANEL - CANCER CENTER ONLY
CO2: 29 mEq/L (ref 18–33)
Calcium: 9.3 mg/dL (ref 8.0–10.3)
Creat: 0.9 mg/dl (ref 0.6–1.2)
Glucose, Bld: 135 mg/dL — ABNORMAL HIGH (ref 73–118)
Sodium: 143 mEq/L (ref 128–145)

## 2012-10-16 LAB — CBC WITH DIFFERENTIAL (CANCER CENTER ONLY)
BASO%: 0.3 % (ref 0.0–2.0)
HCT: 40.4 % (ref 34.8–46.6)
LYMPH%: 34.9 % (ref 14.0–48.0)
MCH: 24.7 pg — ABNORMAL LOW (ref 26.0–34.0)
MCV: 77 fL — ABNORMAL LOW (ref 81–101)
MONO#: 0.7 10*3/uL (ref 0.1–0.9)
MONO%: 10.7 % (ref 0.0–13.0)
NEUT%: 52.9 % (ref 39.6–80.0)
Platelets: 238 10*3/uL (ref 145–400)
RDW: 14.3 % (ref 11.1–15.7)
WBC: 6.6 10*3/uL (ref 3.9–10.0)

## 2012-10-16 NOTE — Progress Notes (Signed)
This office note has been dictated.

## 2012-10-17 NOTE — Progress Notes (Signed)
CC:   Tinnie Gens A. Tawanna Cooler, MD  DIAGNOSIS:  Chronic immune thrombocytopenia.  CURRENT THERAPY:  Patient to taper off prednisone in 2 weeks.  INTERIM HISTORY:  Autumn Acevedo comes in for a followup.  She is really doing well.  She is on prednisone 10 mg a day now.  I told her to go down 5 mg a day for the next 2 weeks, then she can take the prednisone off.  Her blood sugars are getting much better with the prednisone taper.  Her skin is a little on the dry side.  The patient has had no problems with bowels or bladder.  She has had no mouth sores.  There has been no thrush.  There has been no leg swelling.  She has had no bleeding or bruising.  PHYSICAL EXAMINATION:  General:  This is a well-developed, well- nourished [name] in no obvious distress.  Vital signs: Temperature of 98.4, pulse 85, respiratory rate 16, blood pressure 140/90.  Weight is 179.  Head and neck:  Normocephalic, atraumatic skull.  There are no ocular or oral lesions.  There are no palpable cervical or supraclavicular lymph nodes.  Lungs:  Clear bilaterally. Cardiac:  Regular rate and rhythm with a normal S1, S2.  There are no murmurs, rubs, or bruits.  Abdomen:  Soft with good bowel sounds.  There is no palpable abdominal mass.  There is no fluid wave.  There is no palpable hepatosplenomegaly.  Back:  No tenderness over the spine, ribs, or hips.  Extremities:  No clubbing, cyanosis, or edema.  Skin:  No rashes, ecchymoses, or petechia.  LABORATORY STUDIES:  White cell count is 6.6, hemoglobin 13, hematocrit 40.4, platelet count 238.  IMPRESSION:  Autumn Acevedo is a very charming 52 year old [name] with chronic immune thrombocytopenia.  She has responded very well to prednisone.  We also gave her a dose of IVIG while she was hospitalized.  I believe that her platelets should maintain themselves once she gets off prednisone.  I think we can move her to a 58-month appointment now.  I want  see her back after Labor Day.  I told her that she can always call us and get lab work if necessary.    ______________________________ Josph Macho, M.D. PRE/MEDQ  D:  10/16/2012  T:  10/17/2012  Job:  [id number]

## 2012-11-09 ENCOUNTER — Encounter (HOSPITAL_COMMUNITY): Payer: Self-pay | Admitting: Emergency Medicine

## 2012-11-09 ENCOUNTER — Emergency Department (HOSPITAL_COMMUNITY)
Admission: EM | Admit: 2012-11-09 | Discharge: 2012-11-09 | Disposition: A | Discharge status: Home / Self Care | Payer: Managed Care, Other (non HMO) | Source: Home / Self Care | Attending: Family Medicine | Admitting: Family Medicine

## 2012-11-09 DIAGNOSIS — J302 Other seasonal allergic rhinitis: Secondary | ICD-10-CM

## 2012-11-09 DIAGNOSIS — J309 Allergic rhinitis, unspecified: Secondary | ICD-10-CM

## 2012-11-09 MED ORDER — FLUTICASONE PROPIONATE 50 MCG/ACT NA SUSP: 1.0000 | Freq: Two times a day (BID) | NASAL | Status: DC

## 2012-11-09 MED ORDER — PREDNISONE 50 MG PO TABS: ORAL_TABLET | ORAL | Status: DC

## 2012-11-09 NOTE — ED Provider Notes (Addendum)
History    CSN: [id number] Arrival date & time 11/09/12  1126  None    Chief Complaint  Patient presents with  . URI   (Consider location/radiation/quality/duration/timing/severity/associated sxs/prior Treatment) Patient is a 52 y.o. female presenting with URI. The history is provided by the patient.  URI Presenting symptoms: congestion, cough, facial pain and rhinorrhea   Presenting symptoms: no fever   Severity:  Mild Onset quality:  Gradual Duration:  3 days Progression:  Worsening Chronicity:  New  Past Medical History  Diagnosis Date  . Allergy   . Hypertension   . Goiter   . Hypothyroid   . Diabetes mellitus without complication    Past Surgical History  Procedure Laterality Date  . No surgeries at this time     Family History  Problem Relation Age of Onset  . Hypertension      fhx   History  Substance Use Topics  . Smoking status: Never Smoker   . Smokeless tobacco: Never Used  . Alcohol Use: No   OB History   Grav Para Term Preterm Abortions TAB SAB Ect Mult Living                 Review of Systems  Constitutional: Negative for fever.  HENT: Positive for congestion, rhinorrhea and postnasal drip.   Respiratory: Positive for cough.     Allergies  Review of patient's allergies indicates no known allergies.  Home Medications   Current Outpatient Rx  Name  Route  Sig  Dispense  Refill  . Hydrocodone-Acetaminophen 7.5-300 MG TABS   Oral   Take by mouth as needed. One half tab PRN         . levothyroxine (SYNTHROID, LEVOTHROID) 100 MCG tablet   Oral   Take 1 tablet (100 mcg total) by mouth daily.   90 tablet   3   . lisinopril (PRINIVIL,ZESTRIL) 2.5 MG tablet   Oral   Take 1 tablet (2.5 mg total) by mouth daily.   100 tablet   3   . Multiple Vitamin (MULTIVITAMIN WITH MINERALS) TABS   Oral   Take 1 tablet by mouth daily.         Marland Kitchen albuterol (PROVENTIL HFA;VENTOLIN HFA) 108 (90 BASE) MCG/ACT inhaler   Inhalation   Inhale 2  puffs into the lungs as needed for wheezing.         . diazepam (VALIUM) 2 MG tablet   Oral   Take by mouth as needed. 1/2 tab PRN         . fluticasone (FLONASE) 50 MCG/ACT nasal spray   Nasal   Place 1 spray into the nose 2 (two) times daily.   1 g   2   . FREESTYLE LITE test strip   Other   1 each by Other route 3 (three) times daily.   100 each   12     Dispense as written.    Dx 250.00   . metFORMIN (GLUCOPHAGE) 500 MG tablet   Oral   Take 1 tablet (500 mg total) by mouth 2 (two) times daily with a meal.   200 tablet   3   . pantoprazole (PROTONIX) 40 MG tablet   Oral   Take 1 tablet (40 mg total) by mouth daily.   100 tablet   3   . predniSONE (DELTASONE) 20 MG tablet   Oral   Take 20 mg by mouth daily. 2 tabs daily to = 40 mg.         Marland Kitchen  predniSONE (DELTASONE) 50 MG tablet      1 tab daily for 2 days then 1/2 tab daily for 2 days.   3 tablet   0    BP 158/99  Pulse 100  Temp(Src) 98.2 F (36.8 C) (Oral)  Resp 21  SpO2 100%  LMP 10/22/2012 Physical Exam  Nursing note and vitals reviewed. Constitutional: She is oriented to person, place, and time. She appears well-developed and well-nourished.  HENT:  Right Ear: External ear normal.  Left Ear: External ear normal.  Nose: Mucosal edema and rhinorrhea present.  Mouth/Throat: Oropharynx is clear and moist.  Eyes: Conjunctivae are normal. Pupils are equal, round, and reactive to light.  Neck: Normal range of motion. Neck supple. Thyromegaly present.  Cardiovascular: Normal rate and regular rhythm.   Pulmonary/Chest: Effort normal and breath sounds normal.  Lymphadenopathy:    She has no cervical adenopathy.  Neurological: She is alert and oriented to person, place, and time.  Skin: Skin is warm and dry.    ED Course  Procedures (including critical care time) Labs Reviewed - No data to display No results found. 1. Allergic rhinitis, seasonal     MDM    Linna Hoff, MD 11/09/12  1302  Linna Hoff, MD 11/09/12 [phone]

## 2012-11-09 NOTE — ED Notes (Signed)
Pt c/o cold sxs onset 3 days.Marland Kitchen sxs include: cough w/clear phlegm, HA, facial pressure... Denies: fevers, SOB, wheezing... Has taken zyrtec and ibup w/no relief... She is alert wn/o signs of acute disitress.

## 2012-12-30 ENCOUNTER — Ambulatory Visit (HOSPITAL_BASED_OUTPATIENT_CLINIC_OR_DEPARTMENT_OTHER): Payer: Managed Care, Other (non HMO) | Admitting: Hematology & Oncology

## 2012-12-30 ENCOUNTER — Other Ambulatory Visit (HOSPITAL_BASED_OUTPATIENT_CLINIC_OR_DEPARTMENT_OTHER): Payer: Managed Care, Other (non HMO) | Admitting: Lab

## 2012-12-30 VITALS — BP 148/96 | HR 78 | Temp 98.4°F | Resp 16 | Ht 65.0 in | Wt 180.0 lb

## 2012-12-30 DIAGNOSIS — D693 Immune thrombocytopenic purpura: Secondary | ICD-10-CM

## 2012-12-30 LAB — CBC WITH DIFFERENTIAL (CANCER CENTER ONLY)
BASO%: 0.7 % (ref 0.0–2.0)
EOS%: 3.2 % (ref 0.0–7.0)
HCT: 38.6 % (ref 34.8–46.6)
LYMPH#: 2 10*3/uL (ref 0.9–3.3)
LYMPH%: 34.5 % (ref 14.0–48.0)
MCH: 23.9 pg — ABNORMAL LOW (ref 26.0–34.0)
MCHC: 32.4 g/dL (ref 32.0–36.0)
MONO%: 9.2 % (ref 0.0–13.0)
NEUT%: 52.4 % (ref 39.6–80.0)
RDW: 14.1 % (ref 11.1–15.7)

## 2012-12-30 NOTE — Progress Notes (Signed)
This office note has been dictated.

## 2012-12-31 NOTE — Progress Notes (Signed)
CC:   Tinnie Gens A. Tawanna Cooler, MD  DIAGNOSIS:  Chronic immune thrombocytopenia.  CURRENT THERAPY:  Observation.  INTERIM HISTORY:  Ms. Bartelson comes in for followup.  We went ahead and got her off her prednisone probably about 2 months ago.  Since then, she has had no problems.  She has had no issues with bleeding or bruising.  There has been no nausea or vomiting.  She has had no change in bowel or bladder habits.  PHYSICAL EXAMINATION:  General:  This is a well-developed, well- nourished black female in no obvious distress.  Vital signs: Temperature of 98.4, pulse 78, respiratory rate 18, blood pressure 148/96.  Weight is 180.  Head and neck:  Normocephalic, atraumatic skull.  There are no ocular or oral lesions.  There are no palpable cervical or supraclavicular lymph nodes.  Lungs:  Clear bilaterally. Cardiac:  Regular rate and rhythm with normal S1 and S2.  There are no murmurs, rubs, or bruits.  Abdomen:  Soft.  She has good bowel sounds. There is no palpable abdominal mass.  There is no palpable hepatosplenomegaly.  Extremities:  No clubbing, cyanosis, or edema. Neurological:  No focal neurological deficits.  LABORATORY STUDIES:  White cell count 5.9, hemoglobin 12.5, hematocrit 38.6, platelet count is 249.  MCV is 74.  IMPRESSION:  This is a 52 year old [name] with chronic immune thrombocytopenia.  She is off prednisone now.  Her platelet count is being maintained.  We will go ahead and plan to get her back in about 3 months again.  I think if things look good in 3 months, then we can probably move her visits out to 4 to 6 months.    ______________________________ Josph Macho, M.D. PRE/MEDQ  D:  12/30/2012  T:  12/31/2012  Job:  [id number]

## 2013-01-02 ENCOUNTER — Other Ambulatory Visit: Payer: Self-pay

## 2013-01-02 DIAGNOSIS — Z1231 Encounter for screening mammogram for malignant neoplasm of breast: Secondary | ICD-10-CM

## 2013-01-16 ENCOUNTER — Ambulatory Visit: Payer: Managed Care, Other (non HMO) | Admitting: *Deleted

## 2013-01-23 ENCOUNTER — Encounter: Payer: Managed Care, Other (non HMO) | Attending: Family Medicine | Admitting: *Deleted

## 2013-01-23 ENCOUNTER — Encounter: Payer: Self-pay | Admitting: *Deleted

## 2013-01-23 VITALS — Ht 66.0 in | Wt 180.2 lb

## 2013-01-23 DIAGNOSIS — Z713 Dietary counseling and surveillance: Secondary | ICD-10-CM | POA: Insufficient documentation

## 2013-01-23 DIAGNOSIS — E119 Type 2 diabetes mellitus without complications: Secondary | ICD-10-CM | POA: Insufficient documentation

## 2013-01-23 NOTE — Progress Notes (Signed)
  Patient was seen on 01/23/13 for their 3 month follow-up as a part of the diabetes self-management courses at the [facility]. The following learning objectives were met by your patient during this course:  Patient self reports the following:  Diabetes control has improved since diabetes self-management training: yes, BG's are lower Number of days blood glucose is >200: none. Reported range is 122-162 mg/dl Last MD appointment for diabetes: next visit is in November Changes in treatment plan: none Confidence with ability to manage diabetes: feels a little better, sometimes gets discouraged Areas for improvement with diabetes self-care: plans to increase frequency of exercise to 5 days a week. Willingness to participate in diabetes support group: YES  Your patient has established the following 3 month goals for diabetes self-care:  Count Carbohydrates at most of my meals and snacks YES Increase my activity (for example, take the stairs) at least 4 days a week -  She states she is walking on treadmill 15 minutes 1-2 times a day about 3 days a week.   Please see Diabetes Flow sheet for findings related to patient's self-care.  Follow-Up Plan: Patient is eligible for a "free" 30 minute diabetes self-care appointment in the next year. Patient to call and schedule as needed.

## 2013-02-05 ENCOUNTER — Ambulatory Visit
Admission: RE | Admit: 2013-02-05 | Discharge: 2013-02-05 | Disposition: A | Discharge status: Home / Self Care | Payer: Managed Care, Other (non HMO) | Source: Ambulatory Visit

## 2013-02-05 DIAGNOSIS — Z1231 Encounter for screening mammogram for malignant neoplasm of breast: Secondary | ICD-10-CM

## 2013-02-25 ENCOUNTER — Other Ambulatory Visit (INDEPENDENT_AMBULATORY_CARE_PROVIDER_SITE_OTHER): Payer: Managed Care, Other (non HMO)

## 2013-02-25 LAB — BASIC METABOLIC PANEL
BUN: 9 mg/dL (ref 6–23)
CO2: 27 mEq/L (ref 19–32)
Calcium: 9.3 mg/dL (ref 8.4–10.5)
Chloride: 106 mEq/L (ref 96–112)
Creatinine, Ser: 0.8 mg/dL (ref 0.4–1.2)
Glucose, Bld: 111 mg/dL — ABNORMAL HIGH (ref 70–99)
Potassium: 4.8 mEq/L (ref 3.5–5.1)

## 2013-02-25 LAB — HEMOGLOBIN A1C: Hgb A1c MFr Bld: 7 % — ABNORMAL HIGH (ref 4.6–6.5)

## 2013-02-27 ENCOUNTER — Other Ambulatory Visit: Payer: Self-pay

## 2013-03-04 ENCOUNTER — Ambulatory Visit (INDEPENDENT_AMBULATORY_CARE_PROVIDER_SITE_OTHER): Payer: Managed Care, Other (non HMO) | Admitting: Family Medicine

## 2013-03-04 ENCOUNTER — Encounter: Payer: Self-pay | Admitting: Family Medicine

## 2013-03-04 VITALS — BP 120/86 | Temp 98.6°F | Wt 184.0 lb

## 2013-03-04 DIAGNOSIS — E119 Type 2 diabetes mellitus without complications: Secondary | ICD-10-CM

## 2013-03-04 DIAGNOSIS — I10 Essential (primary) hypertension: Secondary | ICD-10-CM

## 2013-03-04 NOTE — Progress Notes (Signed)
  Subjective:    Patient ID: Autumn Acevedo, female    DOB: 1960-05-24, 52 y.o.   MRN: [id number]  HPI Autumn Acevedo is a 52 year old female who comes in today for followup of diabetes and hypertension  Her blood sugar is 111 fasting A1c 7.0%. She's on metformin 500 mg twice a day. She's watching her diet she is exercising on a regular basis weight is unchanged  She takes lisinopril 2.5 mg daily BP 120/86  She's due for followup at the oncology center Dr. Bartholome Bill follows her because of ITP   Review of Systems Review of systems negative    Objective:   Physical Exam Well-developed well-nourished female no acute distress vital signs stable she's afebrile BP 120/86       Assessment & Plan:  Diabetes type 2 at goal continue current therapy  Hypertension concerned continue current therapy followup in May CPX

## 2013-03-04 NOTE — Progress Notes (Signed)
Pre visit review using our clinic review tool, if applicable. No additional management support is needed unless otherwise documented below in the visit note. 

## 2013-03-04 NOTE — Patient Instructions (Signed)
Continue your current medications diet and exercise  Followup in May for your annual exam  Labs one week prior

## 2013-03-10 ENCOUNTER — Telehealth: Payer: Self-pay | Admitting: Hematology & Oncology

## 2013-03-10 NOTE — Telephone Encounter (Signed)
Left pt message moved 12-8 to 12-9

## 2013-03-31 ENCOUNTER — Ambulatory Visit: Payer: Managed Care, Other (non HMO) | Admitting: Hematology & Oncology

## 2013-03-31 ENCOUNTER — Other Ambulatory Visit: Payer: Managed Care, Other (non HMO) | Admitting: Lab

## 2013-04-01 ENCOUNTER — Ambulatory Visit (HOSPITAL_BASED_OUTPATIENT_CLINIC_OR_DEPARTMENT_OTHER): Payer: Managed Care, Other (non HMO) | Admitting: Lab

## 2013-04-01 ENCOUNTER — Ambulatory Visit (HOSPITAL_BASED_OUTPATIENT_CLINIC_OR_DEPARTMENT_OTHER): Payer: Managed Care, Other (non HMO) | Admitting: Hematology & Oncology

## 2013-04-01 DIAGNOSIS — D693 Immune thrombocytopenic purpura: Secondary | ICD-10-CM

## 2013-04-01 LAB — CBC WITH DIFFERENTIAL (CANCER CENTER ONLY)
BASO#: 0 10*3/uL (ref 0.0–0.2)
BASO%: 0.6 % (ref 0.0–2.0)
EOS%: 3.1 % (ref 0.0–7.0)
HCT: 38.6 % (ref 34.8–46.6)
LYMPH#: 2 10*3/uL (ref 0.9–3.3)
LYMPH%: 37.8 % (ref 14.0–48.0)
MCH: 23 pg — ABNORMAL LOW (ref 26.0–34.0)
MCHC: 32.6 g/dL (ref 32.0–36.0)
MCV: 70 fL — ABNORMAL LOW (ref 81–101)
MONO%: 10.1 % (ref 0.0–13.0)
NEUT%: 48.4 % (ref 39.6–80.0)
Platelets: 247 10*3/uL (ref 145–400)
RDW: 14.6 % (ref 11.1–15.7)
WBC: 5.2 10*3/uL (ref 3.9–10.0)

## 2013-04-01 LAB — CHCC SATELLITE - SMEAR

## 2013-04-01 NOTE — Progress Notes (Signed)
Hematology and Oncology Follow Up Visit  AZHAR YOGI [id number] 01/03/1961 52 y.o. 04/01/2013   Principle Diagnosis:   Chronic immune thrombocytopenia  Current Therapy:    Observation     Interim History:  Ms.. Perry is back for followup. We last saw back in September. She's been doing well. She's had no problems with bleeding or bruising. For monthly cycles are becoming more erratic. She thinks that she is going through the change of life.  She's had no cough. She's had no change in bowel or bladder habits. There has been no rashes. She has had no headaches. She does have a goiter has been followed by Dr. Tawanna Cooler.  She's had no fever sweats or chills.  Medications: Current outpatient prescriptions:albuterol (PROVENTIL HFA;VENTOLIN HFA) 108 (90 BASE) MCG/ACT inhaler, Inhale 2 puffs into the lungs as needed for wheezing., Disp: , Rfl: ;  fluticasone (FLONASE) 50 MCG/ACT nasal spray, Place 1 spray into the nose as needed., Disp: , Rfl: ;  FREESTYLE LITE test strip, 1 each by Other route 2 (two) times daily. , Disp: , Rfl:  levothyroxine (SYNTHROID, LEVOTHROID) 100 MCG tablet, Take 1 tablet (100 mcg total) by mouth daily., Disp: 90 tablet, Rfl: 3;  lisinopril (PRINIVIL,ZESTRIL) 2.5 MG tablet, Take 1 tablet (2.5 mg total) by mouth daily., Disp: 100 tablet, Rfl: 3;  metFORMIN (GLUCOPHAGE) 500 MG tablet, Take 1 tablet (500 mg total) by mouth 2 (two) times daily with a meal., Disp: 200 tablet, Rfl: 3 Multiple Vitamin (MULTIVITAMIN WITH MINERALS) TABS, Take 1 tablet by mouth daily., Disp: , Rfl: ;  pantoprazole (PROTONIX) 40 MG tablet, Take 1 tablet (40 mg total) by mouth daily., Disp: 100 tablet, Rfl: 3  Allergies: No Known Allergies  Past Medical History, Surgical history, Social history, and Family History were reviewed and updated.  Review of Systems: As stated above. Have to  Physical Exam:  This is a well-developed well-nourished [name] in no obvious distress. Vital  signs are temperature of 98.2 pulse 92 blood pressure 126/86. Weight is 183. Head and neck exam shows a goiter. It is smooth and firm. No adenopathy noted in the neck. No scleral icterus. No oral lesions. Lungs are clear. Cardiac exam regular rate and rhythm with no murmurs rubs or bruits. Abdomen is soft. There is no palpable abdominal mass. There is no enlarged spleen or liver. Back exam no tenderness of the spine ribs or hips. Extremities shows no clubbing cyanosis or edema. Neurological exam no focal neurological test. Skin exam no rashes ecchymoses or petechia.  Lab Results  Component Value Date   WBC 5.2 04/01/2013   HGB 12.6 04/01/2013   HCT 38.6 04/01/2013   MCV 70* 04/01/2013   PLT 247 04/01/2013     Chemistry      Component Value Date/Time   NA 140 02/25/2013 0820   NA 143 10/16/2012 0755   K 4.8 02/25/2013 0820   K 4.4 10/16/2012 0755   CL 106 02/25/2013 0820   CL 107 10/16/2012 0755   CO2 27 02/25/2013 0820   CO2 29 10/16/2012 0755   BUN 9 02/25/2013 0820   BUN 7 10/16/2012 0755   CREATININE 0.8 02/25/2013 0820   CREATININE 0.9 10/16/2012 0755      Component Value Date/Time   CALCIUM 9.3 02/25/2013 0820   CALCIUM 9.3 10/16/2012 0755   ALKPHOS 60 07/20/2012 0630   AST 15 07/20/2012 0630   ALT 12 07/20/2012 0630   BILITOT 1.5* 07/20/2012 0630  Impression and Plan: Ms.. Graybeal is a 52 year old African female with chronic immune thrombocytopenia. She was treated with prednisone. She tolerated this well. We get her down in she's been off prednisone now for 5 months. Her platelet count is holding steady.  We will plan to get her back now in 4 months. I do not see need for any blood work in between visits.  Josph Macho, MD 12/9/20149:59 AM

## 2013-07-31 ENCOUNTER — Encounter: Payer: Self-pay | Admitting: Hematology & Oncology

## 2013-07-31 ENCOUNTER — Other Ambulatory Visit (HOSPITAL_BASED_OUTPATIENT_CLINIC_OR_DEPARTMENT_OTHER): Payer: Managed Care, Other (non HMO) | Admitting: Lab

## 2013-07-31 ENCOUNTER — Ambulatory Visit (HOSPITAL_BASED_OUTPATIENT_CLINIC_OR_DEPARTMENT_OTHER): Payer: Managed Care, Other (non HMO) | Admitting: Hematology & Oncology

## 2013-07-31 VITALS — BP 139/84 | HR 79 | Temp 98.2°F | Resp 14 | Ht 63.0 in | Wt 183.0 lb

## 2013-07-31 DIAGNOSIS — D693 Immune thrombocytopenic purpura: Secondary | ICD-10-CM

## 2013-07-31 LAB — CBC WITH DIFFERENTIAL (CANCER CENTER ONLY)
BASO#: 0 10*3/uL (ref 0.0–0.2)
BASO%: 0.6 % (ref 0.0–2.0)
EOS ABS: 0.2 10*3/uL (ref 0.0–0.5)
EOS%: 3.1 % (ref 0.0–7.0)
HEMATOCRIT: 36.2 % (ref 34.8–46.6)
HGB: 11.8 g/dL (ref 11.6–15.9)
LYMPH#: 2.3 10*3/uL (ref 0.9–3.3)
LYMPH%: 36.7 % (ref 14.0–48.0)
MCH: 22.8 pg — ABNORMAL LOW (ref 26.0–34.0)
MCHC: 32.6 g/dL (ref 32.0–36.0)
MCV: 70 fL — AB (ref 81–101)
MONO#: 0.5 10*3/uL (ref 0.1–0.9)
MONO%: 7.4 % (ref 0.0–13.0)
NEUT#: 3.3 10*3/uL (ref 1.5–6.5)
NEUT%: 52.2 % (ref 39.6–80.0)
PLATELETS: 171 10*3/uL (ref 145–400)
RBC: 5.17 10*6/uL (ref 3.70–5.32)
RDW: 14.9 % (ref 11.1–15.7)
WBC: 6.2 10*3/uL (ref 3.9–10.0)

## 2013-07-31 LAB — CHCC SATELLITE - SMEAR

## 2013-07-31 NOTE — Progress Notes (Signed)
Hematology and Oncology Follow Up Visit  ALISSE TUITE [id number] 22-Dec-1960 53 y.o. 07/31/2013   Principle Diagnosis:  Chronic immune thrombocytopenia-in remission  Current Therapy:    Observation     Interim History:  Ms.  Flicker is back for followup. Last saw her back in December. She's doing fairly well. She's had no problems with bleeding or bruising or bruising. She had a good appetite. She did try lose a little weight. She's had no nausea vomiting. There's been no cough. There's been no shortness of breath. She's had no leg swelling. She's had no headache.. She is on metformin for her blood sugars. She is trying to get this under good control.  She's working without any problems.  Medications: Current outpatient prescriptions:albuterol (PROVENTIL HFA;VENTOLIN HFA) 108 (90 BASE) MCG/ACT inhaler, Inhale 2 puffs into the lungs as needed for wheezing., Disp: , Rfl: ;  fluticasone (FLONASE) 50 MCG/ACT nasal spray, Place 1 spray into the nose as needed., Disp: , Rfl: ;  FREESTYLE LITE test strip, 1 each by Other route 2 (two) times daily. , Disp: , Rfl:  levothyroxine (SYNTHROID, LEVOTHROID) 100 MCG tablet, Take 1 tablet (100 mcg total) by mouth daily., Disp: 90 tablet, Rfl: 3;  lisinopril (PRINIVIL,ZESTRIL) 2.5 MG tablet, Take 1 tablet (2.5 mg total) by mouth daily., Disp: 100 tablet, Rfl: 3;  metFORMIN (GLUCOPHAGE) 500 MG tablet, Take 1 tablet (500 mg total) by mouth 2 (two) times daily with a meal., Disp: 200 tablet, Rfl: 3 Multiple Vitamin (MULTIVITAMIN WITH MINERALS) TABS, Take 1 tablet by mouth daily., Disp: , Rfl: ;  pantoprazole (PROTONIX) 40 MG tablet, Take 1 tablet (40 mg total) by mouth daily., Disp: 100 tablet, Rfl: 3  Allergies: No Known Allergies  Past Medical History, Surgical history, Social history, and Family History were reviewed and updated.  Review of Systems: As above  Physical Exam:  height is 5\' 3"  (1.6 m) and weight is 183 lb (83.008 kg). Her oral temperature is  98.2 F (36.8 C). Her blood pressure is 139/84 and her pulse is 79. Her respiration is 14.   Well-developed and well-nourished [name]. Lungs are clear. Cardiac exam regular in rhythm with no murmurs rubs or bruits. Abdomen is soft. She has good bowel sounds. There is no fluid wave. There is no palpable liver or spleen tip. Back exam no tenderness over the spine ribs or hips. Extremities shows no clubbing cyanosis or edema. Skin exam no rashes. Neurological exam no focal deficits. We'll start exam is unremarkable.  Lab Results  Component Value Date   WBC 6.2 07/31/2013   HGB 11.8 07/31/2013   HCT 36.2 07/31/2013   MCV 70* 07/31/2013   PLT 171 07/31/2013     Chemistry      Component Value Date/Time   NA 140 02/25/2013 0820   NA 143 10/16/2012 0755   K 4.8 02/25/2013 0820   K 4.4 10/16/2012 0755   CL 106 02/25/2013 0820   CL 107 10/16/2012 0755   CO2 27 02/25/2013 0820   CO2 29 10/16/2012 0755   BUN 9 02/25/2013 0820   BUN 7 10/16/2012 0755   CREATININE 0.8 02/25/2013 0820   CREATININE 0.9 10/16/2012 0755      Component Value Date/Time   CALCIUM 9.3 02/25/2013 0820   CALCIUM 9.3 10/16/2012 0755   ALKPHOS 60 07/20/2012 0630   AST 15 07/20/2012 0630   ALT 12 07/20/2012 0630   BILITOT 1.5* 07/20/2012 0630  Impression and Plan: Ms. Prue is a 53 year old [name]. She has chronic immune thrombocytopenia. We treated her with steroids. I think she may have had one dose of IVIG while in the hospital.  She is in remission. She's doing quite well.  With her blood smear. I do not see anything that looked suspicious. Platelets looked mature. The platelets were well granulated. White cells and red cells all appeared of normal morphology.  We will plan to get her back in 6 months now.  I do not see need for a blood work in between visits. Her Volanda Napoleon, MD 4/9/20158:37 AM

## 2013-08-14 ENCOUNTER — Encounter: Payer: Self-pay | Admitting: Gastroenterology

## 2013-08-22 ENCOUNTER — Other Ambulatory Visit (INDEPENDENT_AMBULATORY_CARE_PROVIDER_SITE_OTHER): Payer: Managed Care, Other (non HMO)

## 2013-08-22 DIAGNOSIS — I10 Essential (primary) hypertension: Secondary | ICD-10-CM

## 2013-08-22 DIAGNOSIS — E119 Type 2 diabetes mellitus without complications: Secondary | ICD-10-CM

## 2013-08-22 LAB — CBC WITH DIFFERENTIAL/PLATELET
Basophils Absolute: 0 10*3/uL (ref 0.0–0.1)
Basophils Relative: 0.5 % (ref 0.0–3.0)
EOS PCT: 2.9 % (ref 0.0–5.0)
Eosinophils Absolute: 0.2 10*3/uL (ref 0.0–0.7)
HCT: 36.5 % (ref 36.0–46.0)
Hemoglobin: 11.7 g/dL — ABNORMAL LOW (ref 12.0–15.0)
LYMPHS PCT: 41.2 % (ref 12.0–46.0)
Lymphs Abs: 2.7 10*3/uL (ref 0.7–4.0)
MCHC: 32.1 g/dL (ref 30.0–36.0)
MCV: 70.6 fl — AB (ref 78.0–100.0)
Monocytes Absolute: 0.5 10*3/uL (ref 0.1–1.0)
Monocytes Relative: 7.9 % (ref 3.0–12.0)
NEUTROS PCT: 47.5 % (ref 43.0–77.0)
Neutro Abs: 3.1 10*3/uL (ref 1.4–7.7)
Platelets: 86 10*3/uL — ABNORMAL LOW (ref 150.0–400.0)
RBC: 5.16 Mil/uL — AB (ref 3.87–5.11)
RDW: 15.3 % — ABNORMAL HIGH (ref 11.5–14.6)
WBC: 6.4 10*3/uL (ref 4.5–10.5)

## 2013-08-22 LAB — HEPATIC FUNCTION PANEL
ALBUMIN: 4.1 g/dL (ref 3.5–5.2)
ALT: 17 U/L (ref 0–35)
AST: 18 U/L (ref 0–37)
Alkaline Phosphatase: 82 U/L (ref 39–117)
BILIRUBIN TOTAL: 0.6 mg/dL (ref 0.3–1.2)
Bilirubin, Direct: 0 mg/dL (ref 0.0–0.3)
Total Protein: 7.2 g/dL (ref 6.0–8.3)

## 2013-08-22 LAB — BASIC METABOLIC PANEL
BUN: 10 mg/dL (ref 6–23)
CALCIUM: 9.2 mg/dL (ref 8.4–10.5)
CO2: 27 mEq/L (ref 19–32)
CREATININE: 0.7 mg/dL (ref 0.4–1.2)
Chloride: 105 mEq/L (ref 96–112)
GFR: 111.01 mL/min (ref >60.00)
GLUCOSE: 118 mg/dL — AB (ref 70–99)
Potassium: 4.5 mEq/L (ref 3.5–5.1)
Sodium: 139 mEq/L (ref 135–145)

## 2013-08-22 LAB — LIPID PANEL
Cholesterol: 140 mg/dL (ref 0–200)
HDL: 40.5 mg/dL (ref >39.00)
LDL Cholesterol: 83 mg/dL (ref 0–99)
Total CHOL/HDL Ratio: 3
Triglycerides: 81 mg/dL (ref 0.0–149.0)
VLDL: 16.2 mg/dL (ref 0.0–40.0)

## 2013-08-22 LAB — POCT URINALYSIS DIPSTICK
BILIRUBIN UA: NEGATIVE
Glucose, UA: NEGATIVE
KETONES UA: NEGATIVE
Leukocytes, UA: NEGATIVE
Nitrite, UA: NEGATIVE
PH UA: 5
Spec Grav, UA: 1.02
Urobilinogen, UA: 0.2

## 2013-08-22 LAB — MICROALBUMIN / CREATININE URINE RATIO
CREATININE, U: 102.1 mg/dL
MICROALB UR: 4.7 mg/dL — AB (ref 0.0–1.9)
Microalb Creat Ratio: 4.6 mg/g (ref 0.0–30.0)

## 2013-08-22 LAB — TSH: TSH: 0.03 u[IU]/mL — ABNORMAL LOW (ref 0.35–5.50)

## 2013-08-22 LAB — HEMOGLOBIN A1C: HEMOGLOBIN A1C: 7.3 % — AB (ref 4.6–6.5)

## 2013-08-25 ENCOUNTER — Other Ambulatory Visit: Payer: Managed Care, Other (non HMO)

## 2013-08-25 ENCOUNTER — Telehealth: Payer: Self-pay | Admitting: Hematology & Oncology

## 2013-08-25 NOTE — Telephone Encounter (Signed)
Pt made 5-6 lab appointment

## 2013-08-27 ENCOUNTER — Other Ambulatory Visit (HOSPITAL_BASED_OUTPATIENT_CLINIC_OR_DEPARTMENT_OTHER): Payer: Managed Care, Other (non HMO) | Admitting: Lab

## 2013-08-27 DIAGNOSIS — D693 Immune thrombocytopenic purpura: Secondary | ICD-10-CM

## 2013-08-27 LAB — CBC WITH DIFFERENTIAL (CANCER CENTER ONLY)
BASO#: 0 10*3/uL (ref 0.0–0.2)
BASO%: 0.3 % (ref 0.0–2.0)
EOS%: 2.6 % (ref 0.0–7.0)
Eosinophils Absolute: 0.2 10*3/uL (ref 0.0–0.5)
HEMATOCRIT: 34.2 % — AB (ref 34.8–46.6)
HGB: 11 g/dL — ABNORMAL LOW (ref 11.6–15.9)
LYMPH#: 2.1 10*3/uL (ref 0.9–3.3)
LYMPH%: 34.2 % (ref 14.0–48.0)
MCH: 22.4 pg — ABNORMAL LOW (ref 26.0–34.0)
MCHC: 32.2 g/dL (ref 32.0–36.0)
MCV: 70 fL — ABNORMAL LOW (ref 81–101)
MONO#: 0.7 10*3/uL (ref 0.1–0.9)
MONO%: 10.8 % (ref 0.0–13.0)
NEUT%: 52.1 % (ref 39.6–80.0)
NEUTROS ABS: 3.2 10*3/uL (ref 1.5–6.5)
Platelets: 146 10*3/uL (ref 145–400)
RBC: 4.91 10*6/uL (ref 3.70–5.32)
RDW: 15.1 % (ref 11.1–15.7)
WBC: 6.1 10*3/uL (ref 3.9–10.0)

## 2013-08-27 LAB — CHCC SATELLITE - SMEAR

## 2013-09-01 ENCOUNTER — Encounter: Payer: Managed Care, Other (non HMO) | Admitting: Family Medicine

## 2013-09-04 ENCOUNTER — Ambulatory Visit (INDEPENDENT_AMBULATORY_CARE_PROVIDER_SITE_OTHER): Payer: Managed Care, Other (non HMO) | Admitting: Family Medicine

## 2013-09-04 ENCOUNTER — Encounter: Payer: Self-pay | Admitting: Family Medicine

## 2013-09-04 VITALS — BP 130/98 | Temp 98.6°F | Ht 66.0 in | Wt 183.0 lb

## 2013-09-04 DIAGNOSIS — D649 Anemia, unspecified: Secondary | ICD-10-CM

## 2013-09-04 DIAGNOSIS — I10 Essential (primary) hypertension: Secondary | ICD-10-CM

## 2013-09-04 DIAGNOSIS — E119 Type 2 diabetes mellitus without complications: Secondary | ICD-10-CM

## 2013-09-04 DIAGNOSIS — D696 Thrombocytopenia, unspecified: Secondary | ICD-10-CM

## 2013-09-04 DIAGNOSIS — D693 Immune thrombocytopenic purpura: Secondary | ICD-10-CM

## 2013-09-04 DIAGNOSIS — E049 Nontoxic goiter, unspecified: Secondary | ICD-10-CM

## 2013-09-04 DIAGNOSIS — E669 Obesity, unspecified: Secondary | ICD-10-CM

## 2013-09-04 DIAGNOSIS — Z0000 Encounter for general adult medical examination without abnormal findings: Secondary | ICD-10-CM

## 2013-09-04 DIAGNOSIS — J45909 Unspecified asthma, uncomplicated: Secondary | ICD-10-CM

## 2013-09-04 DIAGNOSIS — E039 Hypothyroidism, unspecified: Secondary | ICD-10-CM

## 2013-09-04 MED ORDER — PANTOPRAZOLE SODIUM 40 MG PO TBEC: 40.0000 mg | DELAYED_RELEASE_TABLET | Freq: Every day | ORAL | Status: DC

## 2013-09-04 MED ORDER — LISINOPRIL 2.5 MG PO TABS: 2.5000 mg | ORAL_TABLET | Freq: Every day | ORAL | Status: DC

## 2013-09-04 MED ORDER — ALBUTEROL SULFATE HFA 108 (90 BASE) MCG/ACT IN AERS: 2.0000 | INHALATION_SPRAY | RESPIRATORY_TRACT | Status: DC | PRN

## 2013-09-04 MED ORDER — METFORMIN HCL 500 MG PO TABS: 500.0000 mg | ORAL_TABLET | Freq: Two times a day (BID) | ORAL | Status: DC

## 2013-09-04 MED ORDER — LEVOTHYROXINE SODIUM 100 MCG PO TABS: 100.0000 ug | ORAL_TABLET | Freq: Every day | ORAL | Status: DC

## 2013-09-04 NOTE — Progress Notes (Signed)
   Subjective:    Patient ID: Autumn Acevedo, female    DOB: Apr 28, 1960, 53 y.o.   MRN: [id number]  HPI fruma is a 53 year old married female smoker who comes in today for general physical examination because of a history of intermittent asthma, allergic or tinnitus, hypothyroidism, symmetrical goiter, hypertension, diabetes type 2, reflux esophagitis, anemia secondary to heavy menstrual periods, idiopathic thrombocytopenia  She states in April showed a very very very heavy. Hemoglobin is dropped to 11. Platelet count did drop to 86 but has not rebounded to 147.  She gets routine eye care, dental care, BSE monthly, and you mammography, first colonoscopy this coming June is already scheduled  Medication reviewed no changes     Review of Systems  Constitutional: Negative.   HENT: Negative.   Eyes: Negative.   Respiratory: Negative.   Cardiovascular: Negative.   Gastrointestinal: Negative.   Genitourinary: Negative.   Musculoskeletal: Negative.   Neurological: Negative.   Psychiatric/Behavioral: Negative.        Objective:   Physical Exam  Nursing note and vitals reviewed. Constitutional: She is oriented to person, place, and time. She appears well-developed and well-nourished.  HENT:  Head: Normocephalic and atraumatic.  Right Ear: External ear normal.  Left Ear: External ear normal.  Nose: Nose normal.  Mouth/Throat: Oropharynx is clear and moist.  Eyes: EOM are normal. Pupils are equal, round, and reactive to light.  Neck: Normal range of motion. Neck supple. No thyromegaly present.  Cardiovascular: Normal rate, regular rhythm, normal heart sounds and intact distal pulses.  Exam reveals no gallop and no friction rub.   No murmur heard. No carotid neurologic bruits peripheral pulses 2+ and symmetrical  Pulmonary/Chest: Effort normal and breath sounds normal.  Abdominal: Soft. Bowel sounds are normal. She exhibits no distension and no mass. There is no tenderness. There is  no rebound.  Genitourinary:  Bilateral breast exam normal  Pelvic and Pap last year normal therefore not repeated  Musculoskeletal: Normal range of motion.  Lymphadenopathy:    She has no cervical adenopathy.  Neurological: She is alert and oriented to person, place, and time. She has normal reflexes. No cranial nerve deficit. She exhibits normal muscle tone. Coordination normal.  Skin: Skin is warm and dry.  Psychiatric: She has a normal mood and affect. Her behavior is normal. Judgment and thought content normal.          Assessment & Plan:  Obesity again encouraged diet exercise and weight loss  Intermittent asthma albuterol when necessary  Hypothyroidism Synthroid 50 mcg daily  Diabetes type 2 at goal continue current therapy  Reflux esophagitis continue current therapy  Anemia.......... trauma via and  ITP followed in hematology

## 2013-09-04 NOTE — Patient Instructions (Signed)
Synthroid 100 mcg.........Marland Kitchen go ahead and take one daily to keep you goiter suppressed  Continue other medications  Followup in 6 months for your diabetes.Marland Kitchen

## 2013-09-04 NOTE — Progress Notes (Signed)
Pre visit review using our clinic review tool, if applicable. No additional management support is needed unless otherwise documented below in the visit note. 

## 2013-09-05 ENCOUNTER — Telehealth: Payer: Self-pay | Admitting: Family Medicine

## 2013-09-05 NOTE — Telephone Encounter (Signed)
Relevant patient education assigned to patient using Emmi. ° °

## 2013-09-25 ENCOUNTER — Ambulatory Visit (AMBULATORY_SURGERY_CENTER): Payer: Self-pay | Admitting: *Deleted

## 2013-09-25 VITALS — Ht 66.0 in | Wt 183.0 lb

## 2013-09-25 DIAGNOSIS — Z1211 Encounter for screening for malignant neoplasm of colon: Secondary | ICD-10-CM

## 2013-09-25 MED ORDER — PREPOPIK 10-3.5-12 MG-GM-GM PO PACK: 1.0000 | PACK | Freq: Once | ORAL | Status: DC

## 2013-09-25 NOTE — Progress Notes (Signed)
No egg or soy allergy. No anesthesia problems.  No home O2,  No diet meds.  

## 2013-10-16 ENCOUNTER — Encounter: Payer: Self-pay | Admitting: Gastroenterology

## 2013-10-16 ENCOUNTER — Ambulatory Visit (AMBULATORY_SURGERY_CENTER): Payer: Managed Care, Other (non HMO) | Admitting: Gastroenterology

## 2013-10-16 VITALS — BP 126/78 | HR 72 | Temp 97.5°F | Resp 15 | Ht 66.0 in | Wt 183.0 lb

## 2013-10-16 DIAGNOSIS — K5010 Crohn's disease of large intestine without complications: Secondary | ICD-10-CM

## 2013-10-16 DIAGNOSIS — K50119 Crohn's disease of large intestine with unspecified complications: Secondary | ICD-10-CM

## 2013-10-16 DIAGNOSIS — K5289 Other specified noninfective gastroenteritis and colitis: Secondary | ICD-10-CM

## 2013-10-16 DIAGNOSIS — Z1211 Encounter for screening for malignant neoplasm of colon: Secondary | ICD-10-CM

## 2013-10-16 LAB — GLUCOSE, CAPILLARY
GLUCOSE-CAPILLARY: 144 mg/dL — AB (ref 70–99)
Glucose-Capillary: 121 mg/dL — ABNORMAL HIGH (ref 70–99)

## 2013-10-16 MED ORDER — SODIUM CHLORIDE 0.9 % IV SOLN: 500.0000 mL | INTRAVENOUS | Status: DC

## 2013-10-16 NOTE — Progress Notes (Signed)
Patient stating she did take all of her prep, all liquid, no solids. Patient compliant with all instructions. No inhaler with patient, last usage one year ago.

## 2013-10-16 NOTE — Op Note (Signed)
[facility]  [address] [place], [id number]   COLONOSCOPY PROCEDURE REPORT  PATIENT: Autumn Acevedo, Autumn Acevedo  MR#: [id number] BIRTHDATE: [birth year] , 52  yrs. old GENDER: Female ENDOSCOPIST: Ladene Artist, MD, [facility] REFERRED IF:OYDXAJO Delora Fuel, M.D. PROCEDURE DATE:  10/16/2013 PROCEDURE:   Colonoscopy with biopsy First Screening Colonoscopy - Avg.  risk and is 50 yrs.  old or older - No.  Prior Negative Screening - Now for repeat screening. N/A  History of Adenoma - Now for follow-up colonoscopy & has been > or = to 3 yrs.  N/A  Polyps Removed Today? No.  Recommend repeat exam, <10 yrs? No. ASA CLASS:   Class II INDICATIONS:average risk screening. MEDICATIONS: MAC sedation, administered by CRNA and propofol (Diprivan) 250mg  IV DESCRIPTION OF PROCEDURE:   After the risks benefits and alternatives of the procedure were thoroughly explained, informed consent was obtained.  A digital rectal exam revealed no abnormalities of the rectum.   The LB [id number]  endoscope was introduced through the anus and advanced to the cecum, which was identified by both the appendix and ileocecal valve. No adverse events experienced.   The quality of the prep was Prepopik good The instrument was then slowly withdrawn as the colon was fully examined.  COLON FINDINGS: Abnormal mucosa was found in the sigmoid colon.  The mucosa was erythematous, friable and congested.  Multiple biopsies of the area were performed.   The colon was otherwise normal. There was no diverticulosis, inflammation, polyps or cancers unless previously stated.  Retroflexed views revealed small internal hemorrhoids. The time to cecum=4 minutes 14 seconds.  Withdrawal time=9 minutes 43 seconds.  The scope was withdrawn and the procedure completed. COMPLICATIONS: There were no complications.  ENDOSCOPIC IMPRESSION: 1.   Mild segmental colitis in the sigmoid colon; multiple biopsies performed 2.   Small internal  hemorrhoids  RECOMMENDATIONS: 1.  Await pathology results 2.  You should continue to follow colorectal cancer screening guidelines for "routine risk" patients with a repeat colonoscopy in 10 years.  There is no need for FOBT (stool) testing for at least 5 years.  eSigned:  Ladene Artist, MD, [facility] 10/16/2013 8:27 AM

## 2013-10-16 NOTE — Progress Notes (Signed)
No complaints noted in the recovery room. Maw   

## 2013-10-16 NOTE — Progress Notes (Signed)
A/ox3 pleased with MAC, report to Annette RN 

## 2013-10-16 NOTE — Patient Instructions (Addendum)
Handouts were given to your care partner on a high fiber diet with liberal fluid intake and hemorrhoids You may resume your current medications today. Await biopsy results. Please call if any questions or concerns. Blood sugar was 144 in the recovery room.    YOU HAD AN ENDOSCOPIC PROCEDURE TODAY AT [facility]: Refer to the procedure report that was given to you for any specific questions about what was found during the examination.  If the procedure report does not answer your questions, please call your gastroenterologist to clarify.  If you requested that your care partner not be given the details of your procedure findings, then the procedure report has been included in a sealed envelope for you to review at your convenience later.  YOU SHOULD EXPECT: Some feelings of bloating in the abdomen. Passage of more gas than usual.  Walking can help get rid of the air that was put into your GI tract during the procedure and reduce the bloating. If you had a lower endoscopy (such as a colonoscopy or flexible sigmoidoscopy) you may notice spotting of blood in your stool or on the toilet paper. If you underwent a bowel prep for your procedure, then you may not have a normal bowel movement for a few days.  DIET: Your first meal following the procedure should be a light meal and then it is ok to progress to your normal diet.  A half-sandwich or bowl of soup is an example of a good first meal.  Heavy or fried foods are harder to digest and may make you feel nauseous or bloated.  Likewise meals heavy in dairy and vegetables can cause extra gas to form and this can also increase the bloating.  Drink plenty of fluids but you should avoid alcoholic beverages for 24 hours.  ACTIVITY: Your care partner should take you home directly after the procedure.  You should plan to take it easy, moving slowly for the rest of the day.  You can resume normal activity the day after the procedure however you  should NOT DRIVE or use heavy machinery for 24 hours (because of the sedation medicines used during the test).    SYMPTOMS TO REPORT IMMEDIATELY: A gastroenterologist can be reached at any hour.  During normal business hours, 8:30 AM to 5:00 PM Monday through Friday, call [phone].  After hours and on weekends, please call the GI answering service at [phone] who will take a message and have the physician on call contact you.   Following lower endoscopy (colonoscopy or flexible sigmoidoscopy):  Excessive amounts of blood in the stool  Significant tenderness or worsening of abdominal pains  Swelling of the abdomen that is new, acute  Fever of 100F or higher   FOLLOW UP: If any biopsies were taken you will be contacted by phone or by letter within the next 1-3 weeks.  Call your gastroenterologist if you have not heard about the biopsies in 3 weeks.  Our staff will call the home number listed on your records the next business day following your procedure to check on you and address any questions or concerns that you may have at that time regarding the information given to you following your procedure. This is a courtesy call and so if there is no answer at the home number and we have not heard from you through the emergency physician on call, we will assume that you have returned to your regular daily activities without incident.  SIGNATURES/CONFIDENTIALITY: You  and/or your care partner have signed paperwork which will be entered into your electronic medical record.  These signatures attest to the fact that that the information above on your After Visit Summary has been reviewed and is understood.  Full responsibility of the confidentiality of this discharge information lies with you and/or your care-partner.

## 2013-10-16 NOTE — Progress Notes (Signed)
Called to room to assist during endoscopic procedure.  Patient ID and intended procedure confirmed with present staff. Received instructions for my participation in the procedure from the performing physician.  

## 2013-10-17 ENCOUNTER — Telehealth: Payer: Self-pay

## 2013-10-17 NOTE — Telephone Encounter (Signed)
  Follow up Call-  Call back number 10/16/2013  Post procedure Call Back phone  # [phone]  Permission to leave phone message Yes     Patient questions:  Do you have a fever, pain , or abdominal swelling? No. Pain Score  0 *  Have you tolerated food without any problems? Yes.    Have you been able to return to your normal activities? Yes.    Do you have any questions about your discharge instructions: Diet   No. Medications  No. Follow up visit  No.  Do you have questions or concerns about your Care? No.  Actions: * If pain score is 4 or above: No action needed, pain <4.

## 2013-10-26 ENCOUNTER — Encounter: Payer: Self-pay | Admitting: Gastroenterology

## 2014-01-08 ENCOUNTER — Other Ambulatory Visit: Payer: Self-pay

## 2014-01-08 ENCOUNTER — Telehealth: Payer: Self-pay | Admitting: Family Medicine

## 2014-01-08 DIAGNOSIS — E119 Type 2 diabetes mellitus without complications: Secondary | ICD-10-CM

## 2014-01-08 DIAGNOSIS — Z1231 Encounter for screening mammogram for malignant neoplasm of breast: Secondary | ICD-10-CM

## 2014-01-08 MED ORDER — LISINOPRIL 2.5 MG PO TABS
2.5000 mg | ORAL_TABLET | Freq: Every day | ORAL | Status: DC
Start: 2014-01-08 — End: 2014-05-01

## 2014-01-08 MED ORDER — METFORMIN HCL 500 MG PO TABS: 500.0000 mg | ORAL_TABLET | Freq: Two times a day (BID) | ORAL | Status: DC

## 2014-01-08 MED ORDER — PANTOPRAZOLE SODIUM 40 MG PO TBEC
40.0000 mg | DELAYED_RELEASE_TABLET | Freq: Every day | ORAL | Status: DC
Start: 2014-01-08 — End: 2014-09-09

## 2014-01-08 NOTE — Telephone Encounter (Signed)
[facility] [place], [place] is requesting re-fills on the following: lisinopril (PRINIVIL,ZESTRIL) 2.5 MG tablet metFORMIN (GLUCOPHAGE) 500 MG tablet pantoprazole (PROTONIX) 40 MG tablet

## 2014-01-08 NOTE — Telephone Encounter (Signed)
Rx sent 

## 2014-01-29 ENCOUNTER — Other Ambulatory Visit: Payer: Self-pay | Admitting: *Deleted

## 2014-01-29 DIAGNOSIS — D693 Immune thrombocytopenic purpura: Secondary | ICD-10-CM

## 2014-01-30 ENCOUNTER — Other Ambulatory Visit (HOSPITAL_BASED_OUTPATIENT_CLINIC_OR_DEPARTMENT_OTHER): Payer: Managed Care, Other (non HMO) | Admitting: Lab

## 2014-01-30 ENCOUNTER — Encounter: Payer: Self-pay | Admitting: Hematology & Oncology

## 2014-01-30 ENCOUNTER — Ambulatory Visit (HOSPITAL_BASED_OUTPATIENT_CLINIC_OR_DEPARTMENT_OTHER): Payer: Managed Care, Other (non HMO) | Admitting: Hematology & Oncology

## 2014-01-30 VITALS — BP 122/89 | HR 76 | Temp 98.1°F | Resp 18 | Ht 66.0 in | Wt 180.0 lb

## 2014-01-30 DIAGNOSIS — D693 Immune thrombocytopenic purpura: Secondary | ICD-10-CM

## 2014-01-30 LAB — CBC WITH DIFFERENTIAL (CANCER CENTER ONLY)
BASO#: 0 10*3/uL (ref 0.0–0.2)
BASO%: 0.6 % (ref 0.0–2.0)
EOS%: 2.6 % (ref 0.0–7.0)
Eosinophils Absolute: 0.1 10*3/uL (ref 0.0–0.5)
HEMATOCRIT: 30.1 % — AB (ref 34.8–46.6)
HGB: 9.4 g/dL — ABNORMAL LOW (ref 11.6–15.9)
LYMPH#: 1.8 10*3/uL (ref 0.9–3.3)
LYMPH%: 36.5 % (ref 14.0–48.0)
MCH: 19.8 pg — ABNORMAL LOW (ref 26.0–34.0)
MCHC: 31.2 g/dL — AB (ref 32.0–36.0)
MCV: 63 fL — ABNORMAL LOW (ref 81–101)
MONO#: 0.5 10*3/uL (ref 0.1–0.9)
MONO%: 9 % (ref 0.0–13.0)
NEUT%: 51.3 % (ref 39.6–80.0)
NEUTROS ABS: 2.6 10*3/uL (ref 1.5–6.5)
PLATELETS: 226 10*3/uL (ref 145–400)
RBC: 4.75 10*6/uL (ref 3.70–5.32)
RDW: 18.5 % — ABNORMAL HIGH (ref 11.1–15.7)
WBC: 5 10*3/uL (ref 3.9–10.0)

## 2014-01-30 NOTE — Progress Notes (Signed)
Hematology and Oncology Follow Up Visit  Autumn Acevedo [id number] Aug 01, 1960 53 y.o. 01/30/2014   Principle Diagnosis:  Chronic immune thrombocytopenia-in remission  Current Therapy:    Observation     Interim History:  Ms.  Acevedo is back for followup. Last saw her back in April. She's doing fairly well.   Unfortunately, her family Center to well. Her brother has metastatic pancreatic cancer. Her sister recently was diagnosed with stage II colon cancer   .She's had no problems with bleeding or bruising or bruising. She had a good appetite. She did try lose a little weight. She's had no nausea vomiting. There's been no cough. There's been no shortness of breath. She's had no leg swelling. She's had no headache.. She is on metformin for her blood sugars. She is trying to get this under good control.  She's working without any problems.  Medications: Current outpatient prescriptions:albuterol (PROVENTIL HFA;VENTOLIN HFA) 108 (90 BASE) MCG/ACT inhaler, Inhale 2 puffs into the lungs as needed for wheezing., Disp: 1 Inhaler, Rfl: 1;  ferrous sulfate 325 (65 FE) MG tablet, Take 325 mg by mouth daily with breakfast., Disp: , Rfl: ;  fluticasone (FLONASE) 50 MCG/ACT nasal spray, Place 1 spray into the nose as needed., Disp: , Rfl:  FREESTYLE LITE test strip, 1 each by Other route 2 (two) times daily. , Disp: , Rfl: ;  levothyroxine (SYNTHROID, LEVOTHROID) 100 MCG tablet, Take 1 tablet (100 mcg total) by mouth daily., Disp: 90 tablet, Rfl: 3;  lisinopril (PRINIVIL,ZESTRIL) 2.5 MG tablet, Take 1 tablet (2.5 mg total) by mouth daily., Disp: 100 tablet, Rfl: 3 metFORMIN (GLUCOPHAGE) 500 MG tablet, Take 1 tablet (500 mg total) by mouth 2 (two) times daily with a meal., Disp: 200 tablet, Rfl: 3;  Multiple Vitamin (MULTIVITAMIN WITH MINERALS) TABS, Take 1 tablet by mouth daily., Disp: , Rfl: ;  pantoprazole (PROTONIX) 40 MG tablet, Take 1 tablet (40 mg total) by mouth daily., Disp: 100 tablet, Rfl:  3  Allergies: No Known Allergies  Past Medical History, Surgical history, Social history, and Family History were reviewed and updated.  Review of Systems: As above  Physical Exam:  height is 5\' 6"  (1.676 m) and weight is 180 lb (81.647 kg). Her oral temperature is 98.1 F (36.7 C). Her blood pressure is 122/89 and her pulse is 76. Her respiration is 18 and oxygen saturation is 99%.   Well-developed and well-nourished [name]. Lungs are clear. Cardiac exam regular in rhythm with no murmurs rubs or bruits. Abdomen is soft. She has good bowel sounds. There is no fluid wave. There is no palpable liver or spleen tip. Back exam no tenderness over the spine ribs or hips. Extremities shows no clubbing cyanosis or edema. Skin exam no rashes. Neurological exam no focal deficits. We'll start exam is unremarkable.  Lab Results  Component Value Date   WBC 5.0 01/30/2014   HGB 9.4* 01/30/2014   HCT 30.1* 01/30/2014   MCV 63* 01/30/2014   PLT 226 01/30/2014     Chemistry      Component Value Date/Time   NA 139 08/22/2013 0924   NA 143 10/16/2012 0755   K 4.5 08/22/2013 0924   K 4.4 10/16/2012 0755   CL 105 08/22/2013 0924   CL 107 10/16/2012 0755   CO2 27 08/22/2013 0924   CO2 29 10/16/2012 0755   BUN 10 08/22/2013 0924   BUN 7 10/16/2012 0755   CREATININE 0.7 08/22/2013 0924   CREATININE 0.9 10/16/2012 0755  Component Value Date/Time   CALCIUM 9.2 08/22/2013 0924   CALCIUM 9.3 10/16/2012 0755   ALKPHOS 82 08/22/2013 0924   AST 18 08/22/2013 0924   ALT 17 08/22/2013 0924   BILITOT 0.6 08/22/2013 0924         Impression and Plan: Autumn Acevedo is a 53 year old [name]. She has chronic immune thrombocytopenia. We treated her with steroids. I think she may have had one dose of IVIG while in the hospital.  She is in remission. She's doing quite well.  I will worry about the anemia. She's she clearly is microcytic. I think that she is iron deficient. Her last: He was a couple  years ago. I did give her some stool guaiac cards to take. She'll bring these back in a week or so.  I looked at her blood smear. Her red blood cells appeared somewhat microcytic. I saw a couple target cells. I do not see anything that looked suspicious with her white blood cells. Platelets looked mature. The platelets were well granulated.   We will plan to get her back in 6 months now.  If her guaiac cards come back positive, she'll have her back to gastroenterology. She has seen Dr. Fuller Plan. I think she had a colonoscopy a couple years ago.  I do not see need for a blood work in between visits.Volanda Napoleon, MD 10/9/20151:53 PM

## 2014-02-06 ENCOUNTER — Ambulatory Visit
Admission: RE | Admit: 2014-02-06 | Discharge: 2014-02-06 | Disposition: A | Discharge status: Home / Self Care | Payer: Managed Care, Other (non HMO) | Source: Ambulatory Visit

## 2014-02-06 DIAGNOSIS — Z1231 Encounter for screening mammogram for malignant neoplasm of breast: Secondary | ICD-10-CM

## 2014-03-02 ENCOUNTER — Other Ambulatory Visit (INDEPENDENT_AMBULATORY_CARE_PROVIDER_SITE_OTHER): Payer: Managed Care, Other (non HMO)

## 2014-03-02 DIAGNOSIS — E119 Type 2 diabetes mellitus without complications: Secondary | ICD-10-CM

## 2014-03-02 LAB — BASIC METABOLIC PANEL
BUN: 10 mg/dL (ref 6–23)
CHLORIDE: 107 meq/L (ref 96–112)
CO2: 28 mEq/L (ref 19–32)
Calcium: 9 mg/dL (ref 8.4–10.5)
Creatinine, Ser: 0.9 mg/dL (ref 0.4–1.2)
GFR: 85.35 mL/min (ref >60.00)
Glucose, Bld: 119 mg/dL — ABNORMAL HIGH (ref 70–99)
POTASSIUM: 4.6 meq/L (ref 3.5–5.1)
SODIUM: 141 meq/L (ref 135–145)

## 2014-03-02 LAB — HEMOGLOBIN A1C: HEMOGLOBIN A1C: 6.8 % — AB (ref 4.6–6.5)

## 2014-03-09 ENCOUNTER — Ambulatory Visit (INDEPENDENT_AMBULATORY_CARE_PROVIDER_SITE_OTHER): Payer: Managed Care, Other (non HMO) | Admitting: Family Medicine

## 2014-03-09 ENCOUNTER — Encounter: Payer: Self-pay | Admitting: Family Medicine

## 2014-03-09 VITALS — Temp 98.5°F | Wt 183.0 lb

## 2014-03-09 DIAGNOSIS — I10 Essential (primary) hypertension: Secondary | ICD-10-CM

## 2014-03-09 DIAGNOSIS — E139 Other specified diabetes mellitus without complications: Secondary | ICD-10-CM

## 2014-03-09 NOTE — Progress Notes (Signed)
   Subjective:    Patient ID: Autumn Acevedo, female    DOB: 10/29/1960, 53 y.o.   MRN: [id number]  HPI Autumn Acevedo is a 53 year old married female nonsmoker who comes in today for follow-up of diabetes and hypertension  Her blood sugar is in the 119 range on metformin 500 mg twice a day  Her A1c is down to 6.8. Was 7.2%. She's achieved the lower A1c and sugar by being more observant of her diet. Meds stay the same weight is not changed  She is on lisinopril 2.5 mg daily. BP in the past 120/80  BP today right arm sitting position initially 160/100 repeat by me same   Review of Systems    review of systems otherwise negative Objective:   Physical Exam Well-developed well-nourished female no acute distress vital signs stable she's afebrile BP right arm sitting position 160/100       Assessment & Plan:  . Diabetes type 2 at goal............... Continue current therapy follow-up in 6 months  Hypertension.......... Question ago........ BP check daily follow-up in 2 weeks

## 2014-03-09 NOTE — Progress Notes (Signed)
Pre visit review using our clinic review tool, if applicable. No additional management support is needed unless otherwise documented below in the visit note. Pre visit review using our clinic review tool, if applicable. No additional management support is needed unless otherwise documented below in the visit note. 

## 2014-03-09 NOTE — Patient Instructions (Signed)
Continue your current medications  Continue watching her diet more carefully,,,,,,,, you can see it stropped her A1c down to 6.8% which is normal  BPH check every morning follow-up in 2 weeks

## 2014-03-23 ENCOUNTER — Ambulatory Visit (INDEPENDENT_AMBULATORY_CARE_PROVIDER_SITE_OTHER): Payer: Managed Care, Other (non HMO) | Admitting: Family Medicine

## 2014-03-23 ENCOUNTER — Encounter: Payer: Self-pay | Admitting: Family Medicine

## 2014-03-23 VITALS — BP 164/98 | Temp 98.6°F | Wt 184.0 lb

## 2014-03-23 DIAGNOSIS — I10 Essential (primary) hypertension: Secondary | ICD-10-CM

## 2014-03-23 MED ORDER — LISINOPRIL 5 MG PO TABS: 5.0000 mg | ORAL_TABLET | Freq: Every day | ORAL | Status: DC

## 2014-03-23 NOTE — Progress Notes (Signed)
Pre visit review using our clinic review tool, if applicable. No additional management support is needed unless otherwise documented below in the visit note. 

## 2014-03-23 NOTE — Patient Instructions (Signed)
Lisinopril 5 mg daily......... BP check every morning for 4 weeks  If in 4 weeks your blood pressure is back to normal.......Marland Kitchen 135/85 or less........ then continue 5 mg daily  If after 4 weeks your blood pressure is not normal......... increase the lisinopril to 10 mg daily....... and repeat the process  1 sure blood pressure is normal........ then check your blood pressure weekly

## 2014-03-23 NOTE — Progress Notes (Signed)
   Subjective:    Patient ID: Autumn Acevedo, female    DOB: 03-Jan-1961, 53 y.o.   MRN: [id number]  HPI Autumn Acevedo is a 53 year old female who comes in today for follow-up of elevated blood pressure  We saw her a month ago for follow-up of her diabetes. Her blood sugar was under good control. However her blood pressure was elevated. She was on 2.5 mg of lisinopril daily for renal protection. In the meantime she's been checking her blood pressure daily at home and they're all elevated.  BP today 164/98   Review of Systems    review of systems negative Objective:   Physical Exam  Well-developed well-nourished female no acute distress vital signs stable she's afebrile BP right arm sitting position 164/98      Assessment & Plan:  Hypertension not at goal....... increase lisinopril to 5 mg daily........ BP check every morning...Marland KitchenMarland Kitchen.: 4 weeks of blood pressure not normal........ if blood pressure is normal after increasing the dose continue that dose daily

## 2014-04-10 ENCOUNTER — Other Ambulatory Visit: Payer: Self-pay | Admitting: *Deleted

## 2014-04-10 DIAGNOSIS — J45909 Unspecified asthma, uncomplicated: Secondary | ICD-10-CM

## 2014-04-10 DIAGNOSIS — E049 Nontoxic goiter, unspecified: Secondary | ICD-10-CM

## 2014-04-10 MED ORDER — LEVOTHYROXINE SODIUM 100 MCG PO TABS: 100.0000 ug | ORAL_TABLET | Freq: Every day | ORAL | Status: DC

## 2014-04-10 MED ORDER — ALBUTEROL SULFATE HFA 108 (90 BASE) MCG/ACT IN AERS: 2.0000 | INHALATION_SPRAY | RESPIRATORY_TRACT | Status: DC | PRN

## 2014-05-01 ENCOUNTER — Encounter: Payer: Self-pay | Admitting: Hematology & Oncology

## 2014-05-01 ENCOUNTER — Other Ambulatory Visit (HOSPITAL_BASED_OUTPATIENT_CLINIC_OR_DEPARTMENT_OTHER): Payer: Managed Care, Other (non HMO) | Admitting: Lab

## 2014-05-01 ENCOUNTER — Ambulatory Visit (HOSPITAL_BASED_OUTPATIENT_CLINIC_OR_DEPARTMENT_OTHER): Payer: Managed Care, Other (non HMO) | Admitting: Hematology & Oncology

## 2014-05-01 VITALS — BP 150/91 | HR 91 | Temp 98.2°F | Resp 14 | Ht 66.0 in | Wt 185.0 lb

## 2014-05-01 DIAGNOSIS — D500 Iron deficiency anemia secondary to blood loss (chronic): Secondary | ICD-10-CM

## 2014-05-01 DIAGNOSIS — D649 Anemia, unspecified: Secondary | ICD-10-CM

## 2014-05-01 DIAGNOSIS — D693 Immune thrombocytopenic purpura: Secondary | ICD-10-CM

## 2014-05-01 LAB — CBC WITH DIFFERENTIAL (CANCER CENTER ONLY)
BASO#: 0 10*3/uL (ref 0.0–0.2)
BASO%: 0.5 % (ref 0.0–2.0)
EOS%: 2.1 % (ref 0.0–7.0)
Eosinophils Absolute: 0.1 10*3/uL (ref 0.0–0.5)
HCT: 39.8 % (ref 34.8–46.6)
HEMOGLOBIN: 12.8 g/dL (ref 11.6–15.9)
LYMPH#: 2.2 10*3/uL (ref 0.9–3.3)
LYMPH%: 33.4 % (ref 14.0–48.0)
MCH: 22 pg — ABNORMAL LOW (ref 26.0–34.0)
MCHC: 32.2 g/dL (ref 32.0–36.0)
MCV: 69 fL — AB (ref 81–101)
MONO#: 0.6 10*3/uL (ref 0.1–0.9)
MONO%: 9.7 % (ref 0.0–13.0)
NEUT#: 3.6 10*3/uL (ref 1.5–6.5)
NEUT%: 54.3 % (ref 39.6–80.0)
PLATELETS: 261 10*3/uL (ref 145–400)
RBC: 5.81 10*6/uL — AB (ref 3.70–5.32)
RDW: 19.4 % — AB (ref 11.1–15.7)
WBC: 6.6 10*3/uL (ref 3.9–10.0)

## 2014-05-01 LAB — CHCC SATELLITE - SMEAR

## 2014-05-01 NOTE — Progress Notes (Signed)
Hematology and Oncology Follow Up Visit  LEILY CAPEK [id number] May 22, 1960 54 y.o. 05/01/2014   Principle Diagnosis:  Chronic immune thrombocytopenia-in remission  Current Therapy:    Observation     Interim History:  Ms.  Haltiwanger is back for followup. Last saw her back in October. She's doing fairly well.   She now is taking oral iron. She feels better. She has more energy.  Her brother and sister, both have cancer, but seem to be doing pretty well.   .She's had no problems with bleeding or bruising or bruising. She had a good appetite. She did try lose a little weight. She's had no nausea or vomiting. There's been no cough. There's been no shortness of breath. She's had no leg swelling. She's had no headache.. She is on metformin for her blood sugars. She is trying to get this under good control.  Her lisinopril dose has been increased to 5 mg a day.  She's working without any problems.  Medications: Current outpatient prescriptions: albuterol (PROVENTIL HFA;VENTOLIN HFA) 108 (90 BASE) MCG/ACT inhaler, Inhale 2 puffs into the lungs as needed for wheezing., Disp: 2 Inhaler, Rfl: 3;  ferrous sulfate 325 (65 FE) MG tablet, Take 325 mg by mouth daily with breakfast., Disp: , Rfl: ;  fluticasone (FLONASE) 50 MCG/ACT nasal spray, Place 1 spray into the nose as needed., Disp: , Rfl:  FREESTYLE LITE test strip, 1 each by Other route 2 (two) times daily. , Disp: , Rfl: ;  levothyroxine (SYNTHROID, LEVOTHROID) 100 MCG tablet, Take 1 tablet (100 mcg total) by mouth daily., Disp: 90 tablet, Rfl: 3;  lisinopril (PRINIVIL,ZESTRIL) 5 MG tablet, Take 1 tablet (5 mg total) by mouth daily., Disp: 90 tablet, Rfl: 3 metFORMIN (GLUCOPHAGE) 500 MG tablet, Take 1 tablet (500 mg total) by mouth 2 (two) times daily with a meal., Disp: 200 tablet, Rfl: 3;  Multiple Vitamin (MULTIVITAMIN WITH MINERALS) TABS, Take 1 tablet by mouth daily., Disp: , Rfl: ;  pantoprazole (PROTONIX) 40 MG tablet, Take 1 tablet (40 mg  total) by mouth daily., Disp: 100 tablet, Rfl: 3  Allergies: No Known Allergies  Past Medical History, Surgical history, Social history, and Family History were reviewed and updated.  Review of Systems: As above  Physical Exam:  height is 5\' 6"  (1.676 m) and weight is 185 lb (83.915 kg). Her oral temperature is 98.2 F (36.8 C). Her blood pressure is 150/91 and her pulse is 91. Her respiration is 14.   Well-developed and well-nourished [name]. Head and neck exam shows no ocular or oral lesions. She has no palpable adenopathy in the neck. Thyroid is nonpalpable. Lungs are clear. Cardiac exam regular rate and rhythm with no murmurs rubs or bruits. Abdomen is soft. She has good bowel sounds. There is no fluid wave. There is no palpable liver or spleen tip. Back exam shows no tenderness over the spine ribs or hips. Extremities shows no clubbing cyanosis or edema. Skin exam no rashes, ecchymoses or petechia. Neurological exam no focal deficits.  Lab Results  Component Value Date   WBC 6.6 05/01/2014   HGB 12.8 05/01/2014   HCT 39.8 05/01/2014   MCV 69* 05/01/2014   PLT 261 05/01/2014     Chemistry      Component Value Date/Time   NA 141 03/02/2014 0758   NA 143 10/16/2012 0755   K 4.6 03/02/2014 0758   K 4.4 10/16/2012 0755   CL 107 03/02/2014 0758   CL 107 10/16/2012 0755  CO2 28 03/02/2014 0758   CO2 29 10/16/2012 0755   BUN 10 03/02/2014 0758   BUN 7 10/16/2012 0755   CREATININE 0.9 03/02/2014 0758   CREATININE 0.9 10/16/2012 0755      Component Value Date/Time   CALCIUM 9.0 03/02/2014 0758   CALCIUM 9.3 10/16/2012 0755   ALKPHOS 82 08/22/2013 0924   AST 18 08/22/2013 0924   ALT 17 08/22/2013 0924   BILITOT 0.6 08/22/2013 0924         Impression and Plan: Ms. Droz is a 55 year old [name]. She has chronic immune thrombocytopenia. We treated her with steroids. I think she may have had one dose of IVIG while in the hospital.  She is  in remission. She's doing quite well.  For now, I think we get her back in 6 months. The oral iron is helping with her anemia.  She had a colonoscopy just a couple years ago. I'm not sure she needs to go back to be seen.  I do not see need for a blood work in between visits.Volanda Napoleon, MD 1/8/201610:06 AM

## 2014-05-05 ENCOUNTER — Ambulatory Visit (INDEPENDENT_AMBULATORY_CARE_PROVIDER_SITE_OTHER): Payer: Managed Care, Other (non HMO) | Admitting: Family Medicine

## 2014-05-05 ENCOUNTER — Encounter: Payer: Self-pay | Admitting: Family Medicine

## 2014-05-05 VITALS — BP 130/80 | Temp 99.5°F | Wt 178.0 lb

## 2014-05-05 DIAGNOSIS — J989 Respiratory disorder, unspecified: Secondary | ICD-10-CM

## 2014-05-05 DIAGNOSIS — R0989 Other specified symptoms and signs involving the circulatory and respiratory systems: Secondary | ICD-10-CM | POA: Insufficient documentation

## 2014-05-05 MED ORDER — AZITHROMYCIN 250 MG PO TABS: ORAL_TABLET | ORAL | Status: DC

## 2014-05-05 MED ORDER — HYDROCODONE-HOMATROPINE 5-1.5 MG/5ML PO SYRP: 5.0000 mL | ORAL_SOLUTION | Freq: Three times a day (TID) | ORAL | Status: DC | PRN

## 2014-05-05 MED ORDER — PREDNISONE 20 MG PO TABS
ORAL_TABLET | ORAL | Status: DC
Start: 2014-05-05 — End: 2014-09-09

## 2014-05-05 NOTE — Progress Notes (Signed)
Pre visit review using our clinic review tool, if applicable. No additional management support is needed unless otherwise documented below in the visit note. 

## 2014-05-05 NOTE — Progress Notes (Signed)
   Subjective:    Patient ID: Autumn Acevedo, female    DOB: 08/15/1960, 54 y.o.   MRN: [id number]  HPI [name] is a 54 year old female who comes in today with a 2 weeks history of head congestion runny nose and cough  She went to an urgent care week ago with conjunctivitis of her right eye. They gave her antibiotic eyedrops but it hasn't helped  Over the last couple days a cough this gotten worse she can't sleep well at night and is restarted albuterol 2 puffs 4 times daily. She also has severe bilateral frontal headaches.  Her blood sugar and blood pressure under good control   Review of Systems    review of systems otherwise negative Objective:   Physical Exam Well-developed well-nourished female no acute distress vital signs stable she's afebrile HEENT were pertinent she has conjunctivitis of her right eye left eye clear. Neck was supple thyroid is enlarged per usual no adenopathy. Lungs actually were clear no wheezing       Assessment & Plan:  Reactive airway disease from a viral infection........Marland Kitchen prednisone burst and taper  Conjunctivitis right eye viral...... OTC drops

## 2014-05-05 NOTE — Patient Instructions (Signed)
Drink lots of water  Afrin nasal spray......... one shot up each nostril at bedtime for 5 nights  Azithromycin,,,,,,, 1 tab daily for 6 days  Prednisone 20 mg,,,,,,,, 2 tabs for 3 days or until you feel a lot better and then begin to taper  Hydromet,,,,,,, 1/2-1 teaspoon 3 times daily when necessary for cough  Vaporizer  Albuterol,,,,,,,,, 2+3 times daily  Follow-up in one week

## 2014-05-12 ENCOUNTER — Encounter: Payer: Self-pay | Admitting: Family Medicine

## 2014-05-12 ENCOUNTER — Ambulatory Visit (INDEPENDENT_AMBULATORY_CARE_PROVIDER_SITE_OTHER): Payer: Managed Care, Other (non HMO) | Admitting: Family Medicine

## 2014-05-12 VITALS — BP 150/98 | Temp 98.5°F | Wt 186.0 lb

## 2014-05-12 DIAGNOSIS — R0989 Other specified symptoms and signs involving the circulatory and respiratory systems: Secondary | ICD-10-CM

## 2014-05-12 DIAGNOSIS — J989 Respiratory disorder, unspecified: Secondary | ICD-10-CM

## 2014-05-12 NOTE — Progress Notes (Signed)
   Subjective:    Patient ID: Autumn Acevedo, female    DOB: [birth year], 54 y.o.   MRN: [id number]  HPI Nyomi is a 54 year old female nonsmoker who comes in today for follow-up of asthma  She had a viral syndrome with secondary wheezing and we started on prednisone 40 mg daily for 3 days and to taper. She's done well and is down to 1 tablet a day. Due to dropped to 10 mg starting tomorrow   Review of Systems    review of systems negative feels well Objective:   Physical Exam Well-developed well-nourished female no acute distress vital signs stable she's afebrile HEENT were negative neck was supple no adenopathy lungs are clear no wheezing       Assessment & Plan:  Reactive airway disease resolved............. taper prednisone slowly

## 2014-05-12 NOTE — Progress Notes (Signed)
Pre visit review using our clinic review tool, if applicable. No additional management support is needed unless otherwise documented below in the visit note. 

## 2014-05-12 NOTE — Patient Instructions (Signed)
Prednisone 20 mg.......... one half tab 4 days...Marland KitchenMarland KitchenMarland Kitchen skip Sunday......... then one half tab Monday Wednesday Friday for a two-week taper

## 2014-07-09 ENCOUNTER — Other Ambulatory Visit: Payer: Self-pay | Admitting: Obstetrics and Gynecology

## 2014-08-25 LAB — HM DIABETES EYE EXAM

## 2014-08-31 ENCOUNTER — Encounter: Payer: Self-pay | Admitting: Family Medicine

## 2014-09-02 ENCOUNTER — Other Ambulatory Visit (INDEPENDENT_AMBULATORY_CARE_PROVIDER_SITE_OTHER): Payer: Managed Care, Other (non HMO)

## 2014-09-02 DIAGNOSIS — Z0000 Encounter for general adult medical examination without abnormal findings: Secondary | ICD-10-CM

## 2014-09-02 LAB — CBC WITH DIFFERENTIAL/PLATELET
BASOS ABS: 0 10*3/uL (ref 0.0–0.1)
Basophils Relative: 0.5 % (ref 0.0–3.0)
EOS ABS: 0.2 10*3/uL (ref 0.0–0.7)
EOS PCT: 2.6 % (ref 0.0–5.0)
HEMATOCRIT: 38.1 % (ref 36.0–46.0)
Hemoglobin: 12.1 g/dL (ref 12.0–15.0)
LYMPHS PCT: 42.3 % (ref 12.0–46.0)
Lymphs Abs: 2.4 10*3/uL (ref 0.7–4.0)
MCHC: 31.8 g/dL (ref 30.0–36.0)
MCV: 72.6 fl — ABNORMAL LOW (ref 78.0–100.0)
MONO ABS: 0.5 10*3/uL (ref 0.1–1.0)
MONOS PCT: 8.2 % (ref 3.0–12.0)
Neutro Abs: 2.7 10*3/uL (ref 1.4–7.7)
Neutrophils Relative %: 46.4 % (ref 43.0–77.0)
PLATELETS: 286 10*3/uL (ref 150.0–400.0)
RBC: 5.25 Mil/uL — ABNORMAL HIGH (ref 3.87–5.11)
RDW: 16.2 % — ABNORMAL HIGH (ref 11.5–15.5)
WBC: 5.8 10*3/uL (ref 4.0–10.5)

## 2014-09-02 LAB — LIPID PANEL
Cholesterol: 168 mg/dL (ref 0–200)
HDL: 39.2 mg/dL (ref >39.00)
LDL Cholesterol: 106 mg/dL — ABNORMAL HIGH (ref 0–99)
NONHDL: 128.8
Total CHOL/HDL Ratio: 4
Triglycerides: 116 mg/dL (ref 0.0–149.0)
VLDL: 23.2 mg/dL (ref 0.0–40.0)

## 2014-09-02 LAB — MICROALBUMIN / CREATININE URINE RATIO
Creatinine,U: 208.1 mg/dL
Microalb Creat Ratio: 8.2 mg/g (ref 0.0–30.0)
Microalb, Ur: 17 mg/dL — ABNORMAL HIGH (ref 0.0–1.9)

## 2014-09-02 LAB — COMPREHENSIVE METABOLIC PANEL
ALT: 15 U/L (ref 0–35)
AST: 14 U/L (ref 0–37)
Albumin: 3.9 g/dL (ref 3.5–5.2)
Alkaline Phosphatase: 90 U/L (ref 39–117)
BILIRUBIN TOTAL: 0.3 mg/dL (ref 0.2–1.2)
BUN: 9 mg/dL (ref 6–23)
CHLORIDE: 105 meq/L (ref 96–112)
CO2: 29 mEq/L (ref 19–32)
CREATININE: 0.8 mg/dL (ref 0.40–1.20)
Calcium: 8.9 mg/dL (ref 8.4–10.5)
GFR: 96.34 mL/min (ref >60.00)
Glucose, Bld: 154 mg/dL — ABNORMAL HIGH (ref 70–99)
Potassium: 4.1 mEq/L (ref 3.5–5.1)
Sodium: 139 mEq/L (ref 135–145)
Total Protein: 6.8 g/dL (ref 6.0–8.3)

## 2014-09-02 LAB — POCT URINALYSIS DIPSTICK
BILIRUBIN UA: NEGATIVE
Blood, UA: NEGATIVE
Glucose, UA: NEGATIVE
KETONES UA: NEGATIVE
LEUKOCYTES UA: NEGATIVE
Nitrite, UA: NEGATIVE
Urobilinogen, UA: 0.2
pH, UA: 5.5

## 2014-09-02 LAB — HEMOGLOBIN A1C: HEMOGLOBIN A1C: 7.6 % — AB (ref 4.6–6.5)

## 2014-09-02 LAB — TSH: TSH: 0.65 u[IU]/mL (ref 0.35–4.50)

## 2014-09-09 ENCOUNTER — Ambulatory Visit (INDEPENDENT_AMBULATORY_CARE_PROVIDER_SITE_OTHER): Payer: Managed Care, Other (non HMO) | Admitting: Family Medicine

## 2014-09-09 ENCOUNTER — Encounter: Payer: Self-pay | Admitting: Family Medicine

## 2014-09-09 VITALS — BP 104/90 | Temp 98.8°F | Ht 65.25 in | Wt 189.0 lb

## 2014-09-09 DIAGNOSIS — E669 Obesity, unspecified: Secondary | ICD-10-CM | POA: Diagnosis not present

## 2014-09-09 DIAGNOSIS — I10 Essential (primary) hypertension: Secondary | ICD-10-CM

## 2014-09-09 DIAGNOSIS — Z0000 Encounter for general adult medical examination without abnormal findings: Secondary | ICD-10-CM

## 2014-09-09 DIAGNOSIS — E139 Other specified diabetes mellitus without complications: Secondary | ICD-10-CM

## 2014-09-09 DIAGNOSIS — E049 Nontoxic goiter, unspecified: Secondary | ICD-10-CM

## 2014-09-09 MED ORDER — METFORMIN HCL 500 MG PO TABS: ORAL_TABLET | ORAL | Status: DC

## 2014-09-09 MED ORDER — FLUTICASONE PROPIONATE 50 MCG/ACT NA SUSP: 1.0000 | NASAL | Status: DC | PRN

## 2014-09-09 MED ORDER — LEVOTHYROXINE SODIUM 100 MCG PO TABS: 100.0000 ug | ORAL_TABLET | Freq: Every day | ORAL | Status: DC

## 2014-09-09 MED ORDER — METFORMIN HCL 1000 MG PO TABS: 1000.0000 mg | ORAL_TABLET | Freq: Every day | ORAL | Status: DC

## 2014-09-09 MED ORDER — LISINOPRIL 10 MG PO TABS: 10.0000 mg | ORAL_TABLET | Freq: Every day | ORAL | Status: DC

## 2014-09-09 NOTE — Patient Instructions (Signed)
Metformin 1000 mg........ one with breakfast  Metformin 500 mg.......Marland Kitchen 1 with your evening female  Diet  Continue exercise program  Increase the lisinopril to 10 mg daily.......... check your blood pressure daily in the morning.......... BP goal 130/80  Follow-up labs in September one week prior to visit......... fasting

## 2014-09-09 NOTE — Progress Notes (Signed)
Pre visit review using our clinic review tool, if applicable. No additional management support is needed unless otherwise documented below in the visit note. 

## 2014-09-09 NOTE — Progress Notes (Signed)
   Subjective:    Patient ID: Autumn Acevedo, female    DOB: 27-Jul-1960, 54 y.o.   MRN: [id number]  HPI Autumn Acevedo is a 54 year old female nonsmoker who comes in today for general physical examination because of a history of diabetes, goiter benign, allergic rhinitis, intermittent asthma, hypertension  She says she's been on Protonix 40 mg daily for couple years but she's not sure why. Asked her to stop the proton X  She takes albuterol when necessary when she has wheezing  He uses Flonase nasal spray for allergic rhinitis when necessary Synthroid 100 g daily to suppress her thyroid. She has a goiter. Otherwise asymptomatic  On lisinopril 5 mg daily her blood pressures 140/90.  She is takes metformin 500 mg twice a day blood sugars in the 150 range A1c now has gone to 7.6%  She says she join the gym is working on a regular basis but she has sugar cravings in the evening which require cake and ice cream etc.  We talked about the negative effects of high blood sugar on her coronary cerebral and renal arteries.  Vaccinations up-to-date tetanus booster 2010  She sees her GYN a regular basis. She has a polyp the glenohumeral removed. She still having periods although she is premenopausal.   Review of Systems  Constitutional: Negative.   HENT: Negative.   Eyes: Negative.   Respiratory: Negative.   Cardiovascular: Negative.   Gastrointestinal: Negative.   Endocrine: Negative.   Genitourinary: Negative.   Musculoskeletal: Negative.   Skin: Negative.   Allergic/Immunologic: Negative.   Neurological: Negative.   Hematological: Negative.   Psychiatric/Behavioral: Negative.        Objective:   Physical Exam  Constitutional: She is oriented to person, place, and time. She appears well-developed and well-nourished.  HENT:  Head: Normocephalic and atraumatic.  Right Ear: External ear normal.  Left Ear: External ear normal.  Nose: Nose normal.  Mouth/Throat: Oropharynx is clear and  moist.  Eyes: EOM are normal. Pupils are equal, round, and reactive to light.  Neck: Normal range of motion. Neck supple. No JVD present. No tracheal deviation present. No thyromegaly present.  Cardiovascular: Normal rate, regular rhythm, normal heart sounds and intact distal pulses.  Exam reveals no gallop and no friction rub.   No murmur heard. Pulmonary/Chest: Effort normal and breath sounds normal. No stridor. No respiratory distress. She has no wheezes. She has no rales. She exhibits no tenderness.  Abdominal: Soft. Bowel sounds are normal. She exhibits no distension and no mass. There is no tenderness. There is no rebound and no guarding.  Genitourinary:  Bilateral breast exam normal  Musculoskeletal: Normal range of motion.  Lymphadenopathy:    She has no cervical adenopathy.  Neurological: She is alert and oriented to person, place, and time. She has normal reflexes. No cranial nerve deficit. She exhibits normal muscle tone. Coordination normal.  Skin: Skin is warm and dry. No rash noted. No erythema. No pallor.  Psychiatric: She has a normal mood and affect. Her behavior is normal. Judgment and thought content normal.  Nursing note and vitals reviewed.         Assessment & Plan:  Healthy female  Diabetes,,,,, not at goal stressed diet increase metformin follow-up A1c in 3 months  Hypertension not at goal,,,,,,, increase lisinopril to 10 mg daily  Obesity,,,,,,,, weight 189........ again stressed diet and exercise  Perimenopausal.......... followed by GYN  Intermittent asthma........ albuterol when necessary

## 2014-10-30 ENCOUNTER — Encounter: Payer: Self-pay | Admitting: Family

## 2014-10-30 ENCOUNTER — Other Ambulatory Visit (HOSPITAL_BASED_OUTPATIENT_CLINIC_OR_DEPARTMENT_OTHER): Payer: Managed Care, Other (non HMO)

## 2014-10-30 ENCOUNTER — Ambulatory Visit (HOSPITAL_BASED_OUTPATIENT_CLINIC_OR_DEPARTMENT_OTHER): Payer: Managed Care, Other (non HMO) | Admitting: Family

## 2014-10-30 VITALS — BP 155/95 | HR 100 | Temp 98.7°F | Resp 14 | Ht 65.0 in | Wt 183.0 lb

## 2014-10-30 DIAGNOSIS — D693 Immune thrombocytopenic purpura: Secondary | ICD-10-CM

## 2014-10-30 DIAGNOSIS — D500 Iron deficiency anemia secondary to blood loss (chronic): Secondary | ICD-10-CM

## 2014-10-30 LAB — CBC WITH DIFFERENTIAL (CANCER CENTER ONLY)
BASO#: 0 10*3/uL (ref 0.0–0.2)
BASO%: 0.4 % (ref 0.0–2.0)
EOS ABS: 0.1 10*3/uL (ref 0.0–0.5)
EOS%: 1.1 % (ref 0.0–7.0)
HEMATOCRIT: 39.6 % (ref 34.8–46.6)
HEMOGLOBIN: 13 g/dL (ref 11.6–15.9)
LYMPH#: 2.1 10*3/uL (ref 0.9–3.3)
LYMPH%: 39.6 % (ref 14.0–48.0)
MCH: 23.3 pg — ABNORMAL LOW (ref 26.0–34.0)
MCHC: 32.8 g/dL (ref 32.0–36.0)
MCV: 71 fL — AB (ref 81–101)
MONO#: 0.5 10*3/uL (ref 0.1–0.9)
MONO%: 9.9 % (ref 0.0–13.0)
NEUT#: 2.6 10*3/uL (ref 1.5–6.5)
NEUT%: 49 % (ref 39.6–80.0)
Platelets: 300 10*3/uL (ref 145–400)
RBC: 5.58 10*6/uL — AB (ref 3.70–5.32)
RDW: 14.5 % (ref 11.1–15.7)
WBC: 5.2 10*3/uL (ref 3.9–10.0)

## 2014-10-30 LAB — RETICULOCYTES (CHCC)
ABS Retic: 136.6 10*3/uL (ref 19.0–186.0)
RBC.: 5.69 MIL/uL — AB (ref 3.87–5.11)
RETIC CT PCT: 2.4 % — AB (ref 0.4–2.3)

## 2014-10-30 LAB — IRON AND TIBC CHCC
%SAT: 20 % — ABNORMAL LOW (ref 21–57)
Iron: 74 ug/dL (ref 41–142)
TIBC: 371 ug/dL (ref 236–444)
UIBC: 298 ug/dL (ref 120–384)

## 2014-10-30 LAB — FERRITIN CHCC: Ferritin: 22 ng/ml (ref 9–269)

## 2014-10-30 LAB — CHCC SATELLITE - SMEAR

## 2014-10-30 NOTE — Progress Notes (Signed)
Hematology and Oncology Follow Up Visit  Autumn Acevedo [id number] 1960/08/23 54 y.o. 10/30/2014   Principle Diagnosis:  Chronic immune thrombocytopenia-in remission   Current Therapy:   Observation    Interim History:  Autumn Acevedo is here today for a follow-up. We last saw her in January and she has had no issues since then. Her platelet count is now up to 300.  She is still taking iron daily. Her Hgb is 13.0.  There has been no bleeding, bruising or rashes. She has had no fever, dizziness, cough. chest pain, palpitations, changes in bowel or bladder habits. No blood in her urine or stool.  She states that her blood sugars are under control. She takes Metformin daily.  No swelling, tenderness, numbness or tingling in her extremities.  She is eating well and staying hydrated with water. She is active and still working. Her weight is stable.   Medications:    Medication List       This list is accurate as of: 10/30/14  8:48 AM.  Always use your most recent med list.               albuterol 108 (90 BASE) MCG/ACT inhaler  Commonly known as:  PROVENTIL HFA;VENTOLIN HFA  Inhale 2 puffs into the lungs as needed for wheezing.     ferrous sulfate 325 (65 FE) MG tablet  Take 325 mg by mouth daily with breakfast.     fluticasone 50 MCG/ACT nasal spray  Commonly known as:  FLONASE  Place 1 spray into both nostrils as needed.     FREESTYLE LITE test strip  Generic drug:  glucose blood  1 each by Other route 2 (two) times daily.     levothyroxine 100 MCG tablet  Commonly known as:  SYNTHROID, LEVOTHROID  Take 1 tablet (100 mcg total) by mouth daily.     lisinopril 10 MG tablet  Commonly known as:  PRINIVIL,ZESTRIL  Take 1 tablet (10 mg total) by mouth daily.     metFORMIN 500 MG tablet  Commonly known as:  GLUCOPHAGE  1 with your evening meal     metFORMIN 1000 MG tablet  Commonly known as:  GLUCOPHAGE  Take 1 tablet (1,000 mg total) by mouth daily with breakfast.     multivitamin with minerals Tabs tablet  Take 1 tablet by mouth daily.        Allergies: No Known Allergies  Past Medical History, Surgical history, Social history, and Family History were reviewed and updated.  Review of Systems: All other 10 point review of systems is negative.   Physical Exam:  vitals were not taken for this visit.  Wt Readings from Last 3 Encounters:  09/09/14 189 lb (85.73 kg)  05/12/14 186 lb (84.369 kg)  05/05/14 178 lb (80.74 kg)    Ocular: Sclerae unicteric, pupils equal, round and reactive to light Ear-nose-throat: Oropharynx clear, dentition fair Lymphatic: No cervical or supraclavicular adenopathy Lungs no rales or rhonchi, good excursion bilaterally Heart regular rate and rhythm, no murmur appreciated Abd soft, nontender, positive bowel sounds MSK no focal spinal tenderness, no joint edema Neuro: non-focal, well-oriented, appropriate affect Breasts: Deferred  Lab Results  Component Value Date   WBC 5.2 10/30/2014   HGB 13.0 10/30/2014   HCT 39.6 10/30/2014   MCV 71* 10/30/2014   PLT 300 Platelet count consistent in citrate 10/30/2014   Lab Results  Component Value Date   FERRITIN 28 07/19/2012   IRON 75 07/19/2012   TIBC 406  07/19/2012   UIBC 331 07/19/2012   IRONPCTSAT 18* 07/19/2012   Lab Results  Component Value Date   RETICCTPCT 9.6* 07/19/2012   RBC 5.58* 10/30/2014   No results found for: KPAFRELGTCHN, LAMBDASER, KAPLAMBRATIO No results found for: IGGSERUM, IGA, IGMSERUM No results found for: Odetta Pink, SPEI   Chemistry      Component Value Date/Time   NA 139 09/02/2014 0841   NA 143 10/16/2012 0755   K 4.1 09/02/2014 0841   K 4.4 10/16/2012 0755   CL 105 09/02/2014 0841   CL 107 10/16/2012 0755   CO2 29 09/02/2014 0841   CO2 29 10/16/2012 0755   BUN 9 09/02/2014 0841   BUN 7 10/16/2012 0755   CREATININE 0.80 09/02/2014 0841   CREATININE 0.9 10/16/2012 0755       Component Value Date/Time   CALCIUM 8.9 09/02/2014 0841   CALCIUM 9.3 10/16/2012 0755   ALKPHOS 90 09/02/2014 0841   AST 14 09/02/2014 0841   ALT 15 09/02/2014 0841   BILITOT 0.3 09/02/2014 0841     Impression and Plan: Autumn Acevedo is a 54 yo [name] with chronic immune thrombocytopenia. She is now in remission. Her platelet count is up to 300.  She has had no problems and is asymptomatic at this time.  She continues to take an iron supplement daily. There is no anemia.  We will plan to follow-up with her as needed at this point. She will contact us with any questions or concerns. We can certainly see her back for any hematologic issues in the future.   Eliezer Bottom, NP 7/8/20168:48 AM

## 2014-12-24 ENCOUNTER — Other Ambulatory Visit: Payer: Self-pay | Admitting: *Deleted

## 2014-12-24 DIAGNOSIS — E139 Other specified diabetes mellitus without complications: Secondary | ICD-10-CM

## 2014-12-24 MED ORDER — METFORMIN HCL 500 MG PO TABS: ORAL_TABLET | ORAL | Status: DC

## 2015-01-06 ENCOUNTER — Other Ambulatory Visit (INDEPENDENT_AMBULATORY_CARE_PROVIDER_SITE_OTHER): Payer: Managed Care, Other (non HMO)

## 2015-01-06 DIAGNOSIS — E139 Other specified diabetes mellitus without complications: Secondary | ICD-10-CM | POA: Diagnosis not present

## 2015-01-06 LAB — BASIC METABOLIC PANEL
BUN: 9 mg/dL (ref 6–23)
CO2: 27 meq/L (ref 19–32)
Calcium: 9.2 mg/dL (ref 8.4–10.5)
Chloride: 106 mEq/L (ref 96–112)
Creatinine, Ser: 0.91 mg/dL (ref 0.40–1.20)
GFR: 82.92 mL/min (ref >60.00)
GLUCOSE: 116 mg/dL — AB (ref 70–99)
POTASSIUM: 4.8 meq/L (ref 3.5–5.1)
Sodium: 143 mEq/L (ref 135–145)

## 2015-01-06 LAB — LIPID PANEL
CHOL/HDL RATIO: 4
Cholesterol: 155 mg/dL (ref 0–200)
HDL: 36.8 mg/dL — ABNORMAL LOW (ref >39.00)
LDL Cholesterol: 94 mg/dL (ref 0–99)
NONHDL: 118.46
Triglycerides: 121 mg/dL (ref 0.0–149.0)
VLDL: 24.2 mg/dL (ref 0.0–40.0)

## 2015-01-06 LAB — HEMOGLOBIN A1C: HEMOGLOBIN A1C: 7.7 % — AB (ref 4.6–6.5)

## 2015-01-13 ENCOUNTER — Ambulatory Visit: Payer: Managed Care, Other (non HMO) | Admitting: Family Medicine

## 2015-01-20 ENCOUNTER — Encounter: Payer: Self-pay | Admitting: Family Medicine

## 2015-01-20 ENCOUNTER — Ambulatory Visit (INDEPENDENT_AMBULATORY_CARE_PROVIDER_SITE_OTHER): Payer: Managed Care, Other (non HMO) | Admitting: Family Medicine

## 2015-01-20 VITALS — BP 130/90 | Temp 98.2°F | Wt 186.0 lb

## 2015-01-20 DIAGNOSIS — E139 Other specified diabetes mellitus without complications: Secondary | ICD-10-CM

## 2015-01-20 MED ORDER — METFORMIN HCL 1000 MG PO TABS: ORAL_TABLET | ORAL | Status: DC

## 2015-01-20 NOTE — Patient Instructions (Signed)
Metformin 1000 mg.......... one tablet before breakfast and one tablet before your evening meal  Hemoglobin A1c and blood sugar......... one week prior to your appointment in 3 months  Walk 30 minutes daily  Work heart on the nutrition issue

## 2015-01-20 NOTE — Progress Notes (Signed)
Pre visit review using our clinic review tool, if applicable. No additional management support is needed unless otherwise documented below in the visit note. Influenza immunization was not given due to paitent gets flu vaccine at work.Marland Kitchen

## 2015-01-20 NOTE — Progress Notes (Signed)
   Subjective:    Patient ID: Autumn Acevedo, female    DOB: 05-04-1960, 54 y.o.   MRN: [id number]  HPI [name] is a 54 year old female nonsmoker who comes in today for follow-up of diabetes  She's been on metformin 1000 mg in the morning and 500 mg prior to your evening meal. Hemoglobin A1c 2 weeks ago was 7.7%. Random blood sugar 116. Reviewing her diet the problem is in the evening she eats ice cream. She says she craves sweets and has to eat ice cream. She is scheduled to see a nutritionist to see if there is anything else she can do. She's also not walking on a daily basis.   Review of Systems    review of systems otherwise negative Objective:   Physical Exam  Well-developed well-nourished female no acute distress vital signs stable she's afebrile      Assessment & Plan:  Diabetes type 2 not at goal....... increase metformin 1000 mg twice a day....... again stressed diet exercise and weight loss

## 2015-01-26 ENCOUNTER — Other Ambulatory Visit: Payer: Self-pay

## 2015-01-26 DIAGNOSIS — Z1231 Encounter for screening mammogram for malignant neoplasm of breast: Secondary | ICD-10-CM

## 2015-02-09 ENCOUNTER — Ambulatory Visit
Admission: RE | Admit: 2015-02-09 | Discharge: 2015-02-09 | Disposition: A | Discharge status: Home / Self Care | Payer: Managed Care, Other (non HMO) | Source: Ambulatory Visit

## 2015-02-09 DIAGNOSIS — Z1231 Encounter for screening mammogram for malignant neoplasm of breast: Secondary | ICD-10-CM

## 2015-03-15 ENCOUNTER — Ambulatory Visit: Payer: Managed Care, Other (non HMO) | Admitting: Family Medicine

## 2015-03-24 ENCOUNTER — Encounter: Payer: Self-pay | Admitting: Family Medicine

## 2015-03-24 ENCOUNTER — Ambulatory Visit (INDEPENDENT_AMBULATORY_CARE_PROVIDER_SITE_OTHER): Payer: Managed Care, Other (non HMO) | Admitting: Family Medicine

## 2015-03-24 VITALS — BP 110/80 | HR 100 | Temp 99.0°F | Wt 184.0 lb

## 2015-03-24 DIAGNOSIS — J989 Respiratory disorder, unspecified: Secondary | ICD-10-CM | POA: Insufficient documentation

## 2015-03-24 DIAGNOSIS — E139 Other specified diabetes mellitus without complications: Secondary | ICD-10-CM | POA: Diagnosis not present

## 2015-03-24 DIAGNOSIS — R0989 Other specified symptoms and signs involving the circulatory and respiratory systems: Secondary | ICD-10-CM | POA: Insufficient documentation

## 2015-03-24 DIAGNOSIS — E049 Nontoxic goiter, unspecified: Secondary | ICD-10-CM

## 2015-03-24 DIAGNOSIS — E669 Obesity, unspecified: Secondary | ICD-10-CM

## 2015-03-24 DIAGNOSIS — I10 Essential (primary) hypertension: Secondary | ICD-10-CM

## 2015-03-24 MED ORDER — ALBUTEROL SULFATE (2.5 MG/3ML) 0.083% IN NEBU
2.5000 mg | INHALATION_SOLUTION | RESPIRATORY_TRACT | Status: AC
  Administered 2015-03-24: 2.5 mg via RESPIRATORY_TRACT

## 2015-03-24 MED ORDER — PREDNISONE 20 MG PO TABS: ORAL_TABLET | ORAL | Status: DC

## 2015-03-24 MED ORDER — HYDROCODONE-HOMATROPINE 5-1.5 MG/5ML PO SYRP: ORAL_SOLUTION | ORAL | Status: DC

## 2015-03-24 NOTE — Progress Notes (Signed)
   Subjective:    Patient ID: Autumn Acevedo, female    DOB: 10-09-1960, 54 y.o.   MRN: [id number]  HPI Eshika is a 54 year old married female nonsmoker who comes in today for evaluation of cough  She states last week she began coughing on Thursday. She was outside when [place] had [place]. She said no fever chills no sputum production. She's had a history of allergic rhinitis and some mild asthma in the past.   Review of Systems Review of systems otherwise negative    Objective:   Physical Exam Well-developed well-nourished female no acute distress vital signs stable she's afebrile HEENT were negative neck was supple no adenopathy lungs are clear no wheezing on forced expiration       Assessment & Plan:  Reactive airway disease..........Marland Kitchen prednisone burst and taper

## 2015-03-24 NOTE — Patient Instructions (Addendum)
Prednisone 20 mg.......... 2 tabs 3 days,...........Marland Kitchen 1 tab 3 days,........... half a tab 3 days,.......... then a half a tab Monday Wednesday Friday for a two-week taper  If after 2 tablets a day for 3 days you don't feel significantly better........... then continue 2 tablets daily until you do. It may take 456 days for airways to quiet down........... then taper as outlined  Hydromet.........Marland Kitchen 1/2-1 teaspoon at bedtime when necessary for cough  Set up a time in May for your annual physical examination  Fasting labs one week prior  Gwynneth Albright are 2 new nurse practitioners or Dr. Martinique

## 2015-03-24 NOTE — Progress Notes (Signed)
Pre visit review using our clinic review tool, if applicable. No additional management support is needed unless otherwise documented below in the visit note. 

## 2015-04-01 ENCOUNTER — Other Ambulatory Visit: Payer: Self-pay | Admitting: Family Medicine

## 2015-04-14 ENCOUNTER — Other Ambulatory Visit: Payer: Managed Care, Other (non HMO)

## 2015-04-21 ENCOUNTER — Ambulatory Visit: Payer: Managed Care, Other (non HMO) | Admitting: Family Medicine

## 2015-05-05 ENCOUNTER — Ambulatory Visit: Payer: Managed Care, Other (non HMO) | Admitting: Family Medicine

## 2015-06-23 LAB — HM DIABETES EYE EXAM

## 2015-06-28 ENCOUNTER — Encounter: Payer: Self-pay | Admitting: Family Medicine

## 2015-07-27 ENCOUNTER — Other Ambulatory Visit: Payer: Self-pay | Admitting: Obstetrics and Gynecology

## 2015-08-23 ENCOUNTER — Ambulatory Visit: Payer: Managed Care, Other (non HMO) | Admitting: Adult Health

## 2015-08-24 ENCOUNTER — Encounter: Payer: Self-pay | Admitting: Adult Health

## 2015-08-24 ENCOUNTER — Ambulatory Visit (INDEPENDENT_AMBULATORY_CARE_PROVIDER_SITE_OTHER): Payer: Managed Care, Other (non HMO) | Admitting: Adult Health

## 2015-08-24 VITALS — BP 140/82 | Temp 97.4°F | Ht 65.0 in | Wt 187.2 lb

## 2015-08-24 DIAGNOSIS — Z760 Encounter for issue of repeat prescription: Secondary | ICD-10-CM

## 2015-08-24 DIAGNOSIS — E139 Other specified diabetes mellitus without complications: Secondary | ICD-10-CM

## 2015-08-24 DIAGNOSIS — I10 Essential (primary) hypertension: Secondary | ICD-10-CM

## 2015-08-24 DIAGNOSIS — Z7189 Other specified counseling: Secondary | ICD-10-CM

## 2015-08-24 DIAGNOSIS — Z7689 Persons encountering health services in other specified circumstances: Secondary | ICD-10-CM

## 2015-08-24 LAB — POCT GLYCOSYLATED HEMOGLOBIN (HGB A1C): Hemoglobin A1C: 7.6

## 2015-08-24 MED ORDER — LISINOPRIL 5 MG PO TABS: 5.0000 mg | ORAL_TABLET | Freq: Every day | ORAL | Status: DC

## 2015-08-24 MED ORDER — ALBUTEROL SULFATE HFA 108 (90 BASE) MCG/ACT IN AERS: 2.0000 | INHALATION_SPRAY | RESPIRATORY_TRACT | Status: DC | PRN

## 2015-08-24 MED ORDER — LISINOPRIL 10 MG PO TABS: 10.0000 mg | ORAL_TABLET | Freq: Every day | ORAL | Status: DC

## 2015-08-24 MED ORDER — LEVOTHYROXINE SODIUM 100 MCG PO TABS: 100.0000 ug | ORAL_TABLET | Freq: Every day | ORAL | Status: DC

## 2015-08-24 MED ORDER — METFORMIN HCL 1000 MG PO TABS: ORAL_TABLET | ORAL | Status: DC

## 2015-08-24 NOTE — Progress Notes (Signed)
Patient presents to clinic today to establish care. She is a very pleasant AA female who  has a past medical history of Allergy; Hypertension; Goiter; Hypothyroid; Diabetes mellitus without complication ([place]); and ITP (idiopathic thrombocytopenic purpura).  Her last CPE was in September 2016.   Acute Concerns: Establish Care  Chronic Issues: Diabetes - She feels as though her diabetes is controlled "ok" Her average is in the 130's. She has noticed a difference since starting to work out. Her last A1c in September was 7.7  Hypertension  - She has not taken her meds today. It is 140/82 in the office today. She reports that she checks it twice a week. Her BP is usually in the mid 130's/80  Health Maintenance: Dental -- Twice a year Vision -- Yearly  Immunizations -- UTD  Colonoscopy --09/2013- 10 year plan  Mammogram --01/2015  PAP -- March 2017 - Normal  She is followed by:  Gyn   Diet: Eats healthy Exercise: Is going to the gym three days a week.    Past Medical History  Diagnosis Date  . Allergy   . Hypertension   . Goiter   . Hypothyroid   . Diabetes mellitus without complication ([place])   . ITP (idiopathic thrombocytopenic purpura)     Past Surgical History  Procedure Laterality Date  . No surgeries at this time      Current Outpatient Prescriptions on File Prior to Visit  Medication Sig Dispense Refill  . albuterol (PROVENTIL HFA;VENTOLIN HFA) 108 (90 BASE) MCG/ACT inhaler Inhale 2 puffs into the lungs as needed for wheezing. 2 Inhaler 3  . FREESTYLE LITE test strip 1 each by Other route 2 (two) times daily.     Marland Kitchen levothyroxine (SYNTHROID, LEVOTHROID) 100 MCG tablet Take 1 tablet (100 mcg total) by mouth daily. 90 tablet 3  . lisinopril (PRINIVIL,ZESTRIL) 10 MG tablet Take 1 tablet (10 mg total) by mouth daily. 90 tablet 3  . metFORMIN (GLUCOPHAGE) 1000 MG tablet One by mouth twice a day 200 tablet 3  . Multiple Vitamin (MULTIVITAMIN WITH MINERALS) TABS Take  1 tablet by mouth daily.     No current facility-administered medications on file prior to visit.    No Known Allergies  Family History  Problem Relation Age of Onset  . Hypertension      fhx  . Colon cancer Neg Hx   . Hypertension Mother   . COPD Father     Social History   Social History  . Marital Status: Single    Spouse Name: N/A  . Number of Children: N/A  . Years of Education: N/A   Occupational History  . Not on file.   Social History Main Topics  . Smoking status: Never Smoker   . Smokeless tobacco: Never Used     Comment: never used tobacco  . Alcohol Use: 0.6 oz/week    1 Standard drinks or equivalent per week  . Drug Use: No  . Sexual Activity: Not on file   Other Topics Concern  . Not on file   Social History Narrative    Review of Systems  Constitutional: Negative.   Respiratory: Negative.   Cardiovascular: Negative.   Gastrointestinal: Negative.   Musculoskeletal: Negative.   Neurological: Negative.   All other systems reviewed and are negative.   BP 140/82 mmHg  Temp(Src) 97.4 F (36.3 C) (Oral)  Ht 5\' 5"  (1.651 m)  Wt 187 lb 3.2 oz (84.913 kg)  BMI 31.15 kg/m2  Physical  Exam  Constitutional: She is oriented to person, place, and time and well-developed, well-nourished, and in no distress. No distress.  Cardiovascular: Normal rate, regular rhythm, normal heart sounds and intact distal pulses.  Exam reveals no gallop and no friction rub.   No murmur heard. Pulmonary/Chest: Effort normal and breath sounds normal. No respiratory distress. She has no wheezes. She has no rales. She exhibits no tenderness.  Neurological: She is alert and oriented to person, place, and time. Gait normal. GCS score is 15.  Skin: Skin is warm and dry. No rash noted. She is not diaphoretic. No erythema. No pallor.  Psychiatric: Mood, memory, affect and judgment normal.  Nursing note and vitals reviewed.   Recent Results (from the past 2160 hour(s))  HM  DIABETES EYE EXAM     Status: None   Collection Time: 06/23/15 12:00 AM  Result Value Ref Range   HM Diabetic Eye Exam No Retinopathy No Retinopathy    Assessment/Plan: 1. Medication refill  - albuterol (PROVENTIL HFA;VENTOLIN HFA) 108 (90 Base) MCG/ACT inhaler; Inhale 2 puffs into the lungs as needed for wheezing.  Dispense: 2 Inhaler; Refill: 3 - levothyroxine (SYNTHROID, LEVOTHROID) 100 MCG tablet; Take 1 tablet (100 mcg total) by mouth daily.  Dispense: 90 tablet; Refill: 3 - lisinopril (PRINIVIL,ZESTRIL) 10 MG tablet; Take 1 tablet (10 mg total) by mouth daily.  Dispense: 90 tablet; Refill: 3 - lisinopril (PRINIVIL,ZESTRIL) 5 MG tablet; Take 1 tablet (5 mg total) by mouth daily.  Dispense: 90 tablet; Refill: 3 - metFORMIN (GLUCOPHAGE) 1000 MG tablet; One by mouth twice a day  Dispense: 200 tablet; Refill: 3  2. Diabetes 1.5, managed as type 2 ([place])  - metFORMIN (GLUCOPHAGE) 1000 MG tablet; One by mouth twice a day  Dispense: 200 tablet; Refill: 3 - POC HgB A1c- 7.6   3. Essential hypertension - Will increase Lisinopril from 10 mg to 5 mg  - lisinopril (PRINIVIL,ZESTRIL) 10 MG tablet; Take 1 tablet (10 mg total) by mouth daily.  Dispense: 90 tablet; Refill: 3 - lisinopril (PRINIVIL,ZESTRIL) 5 MG tablet; Take 1 tablet (5 mg total) by mouth daily.  Dispense: 90 tablet; Refill: 3  4. Encounter to establish care - Follow up with CPE - Follow sooner if needed - Continue to eat healthy and exercise.   Dorothyann Peng, NP

## 2015-08-24 NOTE — Patient Instructions (Addendum)
It was great meeting you today!  Your A1c is 7.6  Continue to eat healthy and exercise! You are doing a great job.   Follow up with me for your physical, if you need anything in the meantime, please let me know.

## 2015-09-23 ENCOUNTER — Other Ambulatory Visit (INDEPENDENT_AMBULATORY_CARE_PROVIDER_SITE_OTHER): Payer: Managed Care, Other (non HMO)

## 2015-09-23 DIAGNOSIS — I10 Essential (primary) hypertension: Secondary | ICD-10-CM

## 2015-09-23 DIAGNOSIS — E139 Other specified diabetes mellitus without complications: Secondary | ICD-10-CM

## 2015-09-23 LAB — BASIC METABOLIC PANEL
BUN: 10 mg/dL (ref 6–23)
CALCIUM: 9.3 mg/dL (ref 8.4–10.5)
CO2: 29 meq/L (ref 19–32)
CREATININE: 0.84 mg/dL (ref 0.40–1.20)
Chloride: 103 mEq/L (ref 96–112)
GFR: 90.71 mL/min (ref >60.00)
GLUCOSE: 143 mg/dL — AB (ref 70–99)
Potassium: 4.5 mEq/L (ref 3.5–5.1)
Sodium: 138 mEq/L (ref 135–145)

## 2015-09-23 LAB — HEPATIC FUNCTION PANEL
ALBUMIN: 4.2 g/dL (ref 3.5–5.2)
ALT: 16 U/L (ref 0–35)
AST: 15 U/L (ref 0–37)
Alkaline Phosphatase: 94 U/L (ref 39–117)
BILIRUBIN DIRECT: 0.1 mg/dL (ref 0.0–0.3)
TOTAL PROTEIN: 7.3 g/dL (ref 6.0–8.3)
Total Bilirubin: 0.5 mg/dL (ref 0.2–1.2)

## 2015-09-23 LAB — CBC WITH DIFFERENTIAL/PLATELET
BASOS ABS: 0 10*3/uL (ref 0.0–0.1)
Basophils Relative: 0.3 % (ref 0.0–3.0)
EOS ABS: 0.1 10*3/uL (ref 0.0–0.7)
Eosinophils Relative: 1.7 % (ref 0.0–5.0)
HEMATOCRIT: 42.3 % (ref 36.0–46.0)
Hemoglobin: 13.4 g/dL (ref 12.0–15.0)
LYMPHS ABS: 2.9 10*3/uL (ref 0.7–4.0)
LYMPHS PCT: 41.5 % (ref 12.0–46.0)
MCHC: 31.7 g/dL (ref 30.0–36.0)
MCV: 71.8 fl — ABNORMAL LOW (ref 78.0–100.0)
MONOS PCT: 9.5 % (ref 3.0–12.0)
Monocytes Absolute: 0.7 10*3/uL (ref 0.1–1.0)
NEUTROS ABS: 3.3 10*3/uL (ref 1.4–7.7)
NEUTROS PCT: 47 % (ref 43.0–77.0)
PLATELETS: 264 10*3/uL (ref 150.0–400.0)
RBC: 5.89 Mil/uL — AB (ref 3.87–5.11)
RDW: 15.1 % (ref 11.5–15.5)
WBC: 6.9 10*3/uL (ref 4.0–10.5)

## 2015-09-23 LAB — POCT URINALYSIS DIPSTICK
Bilirubin, UA: NEGATIVE
Glucose, UA: NEGATIVE
Ketones, UA: NEGATIVE
LEUKOCYTES UA: NEGATIVE
NITRITE UA: NEGATIVE
PH UA: 5
RBC UA: NEGATIVE
Spec Grav, UA: 1.025
UROBILINOGEN UA: 0.2

## 2015-09-23 LAB — LIPID PANEL
CHOL/HDL RATIO: 4
CHOLESTEROL: 144 mg/dL (ref 0–200)
HDL: 40.2 mg/dL (ref >39.00)
LDL CALC: 84 mg/dL (ref 0–99)
NonHDL: 104.2
TRIGLYCERIDES: 102 mg/dL (ref 0.0–149.0)
VLDL: 20.4 mg/dL (ref 0.0–40.0)

## 2015-09-23 LAB — HEMOGLOBIN A1C: HEMOGLOBIN A1C: 7.7 % — AB (ref 4.6–6.5)

## 2015-09-23 LAB — MICROALBUMIN / CREATININE URINE RATIO
Creatinine,U: 189.9 mg/dL
MICROALB UR: 5.5 mg/dL — AB (ref 0.0–1.9)
Microalb Creat Ratio: 2.9 mg/g (ref 0.0–30.0)

## 2015-09-23 LAB — TSH: TSH: 0.03 u[IU]/mL — AB (ref 0.35–4.50)

## 2015-09-30 ENCOUNTER — Ambulatory Visit (INDEPENDENT_AMBULATORY_CARE_PROVIDER_SITE_OTHER): Payer: Managed Care, Other (non HMO) | Admitting: Adult Health

## 2015-09-30 ENCOUNTER — Encounter: Payer: Self-pay | Admitting: Adult Health

## 2015-09-30 VITALS — BP 124/84 | Temp 98.1°F | Ht 65.0 in | Wt 185.0 lb

## 2015-09-30 DIAGNOSIS — Z0000 Encounter for general adult medical examination without abnormal findings: Secondary | ICD-10-CM | POA: Diagnosis not present

## 2015-09-30 DIAGNOSIS — E669 Obesity, unspecified: Secondary | ICD-10-CM

## 2015-09-30 DIAGNOSIS — I10 Essential (primary) hypertension: Secondary | ICD-10-CM

## 2015-09-30 DIAGNOSIS — E139 Other specified diabetes mellitus without complications: Secondary | ICD-10-CM | POA: Diagnosis not present

## 2015-09-30 DIAGNOSIS — E039 Hypothyroidism, unspecified: Secondary | ICD-10-CM

## 2015-09-30 NOTE — Patient Instructions (Signed)
It was great seeing you today!  Your exam was great   All you need to do is get the A1c down through diet and exercise  Take 50 mcg of Synthroid instead of 100.   Follow up with me in 3 months or sooner if needed

## 2015-09-30 NOTE — Progress Notes (Signed)
Subjective:    Patient ID: Autumn Acevedo, female    DOB: 1961/02/12, 55 y.o.   MRN: [id number]  HPI  Patient presents for yearly preventative medicine examination. She is a healthy 55 year old AA female who  has a past medical history of Allergy; Hypertension; Goiter; Hypothyroid; Diabetes mellitus without complication ([place]); and ITP (idiopathic thrombocytopenic purpura).   All immunizations and health maintenance protocols were reviewed with the patient and needed orders were placed.  Appropriate screening laboratory values were ordered for the patient including screening of hyperlipidemia, renal function and hepatic function. If indicated by BPH, a PSA was ordered.  Medication reconciliation,  past medical history, social history, problem list and allergies were reviewed in detail with the patient  Goals were established with regard to weight loss, exercise, and  diet in compliance with medications  She is up to date on her mammograms, GYN exams, dental visits and eye appointments.   She continues to eat healthy and exercises three times a week.   Unfortunately, she has had to place her mother under hospice care.   Diabetes - She feels as though her diabetes is getting under better control. She is taking 2000 mg Metformin in the morning. She does not eat a lot of carbs but in the evening she has snacks such as ice cream, chocolate, cake.    Review of Systems  Constitutional: Negative.   HENT: Negative.   Eyes: Negative.   Respiratory: Negative.   Cardiovascular: Negative.   Gastrointestinal: Negative.   Endocrine: Negative.   Genitourinary: Negative.   Musculoskeletal: Negative.   Skin: Negative.   Allergic/Immunologic: Negative.   Neurological: Negative.   Hematological: Negative.   Psychiatric/Behavioral: Negative.    Past Medical History  Diagnosis Date  . Allergy   . Hypertension   . Goiter   . Hypothyroid   . Diabetes mellitus without complication ([place])     Type2   . ITP (idiopathic thrombocytopenic purpura)     Social History   Social History  . Marital Status: Single    Spouse Name: N/A  . Number of Children: N/A  . Years of Education: N/A   Occupational History  . Not on file.   Social History Main Topics  . Smoking status: Never Smoker   . Smokeless tobacco: Never Used     Comment: never used tobacco  . Alcohol Use: 0.6 oz/week    1 Standard drinks or equivalent per week     Comment: 2-3 drinks a week   . Drug Use: No  . Sexual Activity: Not on file   Other Topics Concern  . Not on file   Social History Narrative   Works for an [employer] consultant    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 Has two grand babies   She enjoys reading and going to the gym. Hanging out with friends.           Past Surgical History  Procedure Laterality Date  . No surgeries at this time      Family History  Problem Relation Age of Onset  . Hypertension Mother     fhx  . COPD Father   . Lung cancer Father   . Pancreatic cancer Brother   . Colon cancer Sister     No Known Allergies  Current Outpatient Prescriptions on File Prior to Visit  Medication Sig Dispense Refill  . albuterol (PROVENTIL HFA;VENTOLIN HFA) 108 (90  Base) MCG/ACT inhaler Inhale 2 puffs into the lungs as needed for wheezing. 2 Inhaler 3  . FREESTYLE LITE test strip 1 each by Other route 2 (two) times daily.     Marland Kitchen levothyroxine (SYNTHROID, LEVOTHROID) 100 MCG tablet Take 1 tablet (100 mcg total) by mouth daily. 90 tablet 3  . lisinopril (PRINIVIL,ZESTRIL) 10 MG tablet Take 1 tablet (10 mg total) by mouth daily. 90 tablet 3  . lisinopril (PRINIVIL,ZESTRIL) 5 MG tablet Take 1 tablet (5 mg total) by mouth daily. 90 tablet 3  . metFORMIN (GLUCOPHAGE) 1000 MG tablet One by mouth twice a day 200 tablet 3  . Multiple Vitamin (MULTIVITAMIN WITH MINERALS) TABS Take 1 tablet by mouth daily.     No current facility-administered medications on  file prior to visit.    BP 124/84 mmHg  Temp(Src) 98.1 F (36.7 C) (Oral)  Ht 5\' 5"  (1.651 m)  Wt 185 lb (83.915 kg)  BMI 30.79 kg/m2       Objective:   Physical Exam  Constitutional: She is oriented to person, place, and time. She appears well-developed and well-nourished. No distress.  HENT:  Head: Normocephalic and atraumatic.  Right Ear: Hearing, tympanic membrane, external ear and ear canal normal.  Left Ear: Hearing, tympanic membrane, external ear and ear canal normal.  Nose: Nose normal.  Mouth/Throat: Uvula is midline, oropharynx is clear and moist and mucous membranes are normal. Abnormal dentition. Dental caries present. No oropharyngeal exudate.  Eyes: Conjunctivae and EOM are normal. Pupils are equal, round, and reactive to light. Right eye exhibits no discharge. Left eye exhibits no discharge. No scleral icterus.  Neck: Normal range of motion. Neck supple. No JVD present. Carotid bruit is not present. No tracheal deviation present. Thyroid mass (Goiter) present. No thyromegaly present.  Cardiovascular: Normal rate, regular rhythm, normal heart sounds and intact distal pulses.  Exam reveals no gallop and no friction rub.   No murmur heard. Pulmonary/Chest: Effort normal and breath sounds normal. No stridor. No respiratory distress. She has no wheezes. She has no rales. She exhibits no tenderness.  Abdominal: Soft. Bowel sounds are normal. She exhibits no distension and no mass. There is no tenderness. There is no rebound and no guarding.  Musculoskeletal: Normal range of motion. She exhibits no edema or tenderness.  Lymphadenopathy:    She has no cervical adenopathy.  Neurological: She is alert and oriented to person, place, and time. No cranial nerve deficit. Coordination normal.  Skin: Skin is warm and dry. No rash noted. She is not diaphoretic. No erythema. No pallor.  Psychiatric: She has a normal mood and affect. Her behavior is normal. Judgment and thought content  normal.  Nursing note and vitals reviewed.     Assessment & Plan:  1. Routine general medical examination at a health care facility - Reviewed labs in detail  - Information given on advanced directives and living wills - Work on diet and exercise - Follow up in 3 months for diabetes follow up - Follow up in one year for repeat CPE  2. Essential hypertension - Well controlled today. No change in medication   3. Diabetes 1.5, managed as type 2 ([place]) - A1c 7.7 - She would like 3 months to work on diet and increase exercise - Will retest then  - Continue to work on diet and exercise.  4. Hypothyroidism, unspecified hypothyroidism type - TSH 0.03.  - Cut synthroid dose in half.  - Will retest in 4-6 weeks  5. Obesity -  Her goal weight is 165-170 - She has lost two pounds  Wt Readings from Last 3 Encounters:  09/30/15 185 lb (83.915 kg)  08/24/15 187 lb 3.2 oz (84.913 kg)  03/24/15 184 lb (83.462 kg)  - Continue to work on diet and exercise  Dorothyann Peng, NP

## 2015-12-23 ENCOUNTER — Other Ambulatory Visit: Payer: Managed Care, Other (non HMO)

## 2015-12-24 ENCOUNTER — Other Ambulatory Visit (INDEPENDENT_AMBULATORY_CARE_PROVIDER_SITE_OTHER): Payer: Managed Care, Other (non HMO)

## 2015-12-24 DIAGNOSIS — E039 Hypothyroidism, unspecified: Secondary | ICD-10-CM

## 2015-12-24 DIAGNOSIS — E139 Other specified diabetes mellitus without complications: Secondary | ICD-10-CM

## 2015-12-24 LAB — TSH: TSH: 0.26 u[IU]/mL — ABNORMAL LOW (ref 0.35–4.50)

## 2015-12-24 LAB — HEMOGLOBIN A1C: Hgb A1c MFr Bld: 8.5 % — ABNORMAL HIGH (ref 4.6–6.5)

## 2015-12-31 ENCOUNTER — Encounter: Payer: Self-pay | Admitting: Adult Health

## 2015-12-31 ENCOUNTER — Ambulatory Visit (INDEPENDENT_AMBULATORY_CARE_PROVIDER_SITE_OTHER): Payer: Managed Care, Other (non HMO) | Admitting: Adult Health

## 2015-12-31 VITALS — BP 136/84 | Temp 98.3°F | Wt 190.6 lb

## 2015-12-31 DIAGNOSIS — E139 Other specified diabetes mellitus without complications: Secondary | ICD-10-CM | POA: Diagnosis not present

## 2015-12-31 LAB — POCT GLYCOSYLATED HEMOGLOBIN (HGB A1C): HEMOGLOBIN A1C: 8

## 2015-12-31 MED ORDER — GLUCOSE BLOOD VI STRP: 1.0000 | ORAL_STRIP | Freq: Two times a day (BID) | 6 refills | Status: DC

## 2015-12-31 MED ORDER — ACCU-CHEK MULTICLIX LANCETS MISC: 12 refills | Status: DC

## 2015-12-31 MED ORDER — FREESTYLE SYSTEM KIT: 1.0000 | PACK | 0 refills | Status: DC | PRN

## 2015-12-31 NOTE — Progress Notes (Signed)
Subjective:    Patient ID: Autumn Acevedo, female    DOB: [birth year], 55 y.o.   MRN: [id number]  Diabetes  She presents for her follow-up diabetic visit. She has type 2 diabetes mellitus. There are no hypoglycemic associated symptoms. There are no hypoglycemic complications. Symptoms are improving. There are no diabetic complications. Risk factors for coronary artery disease include hypertension, diabetes mellitus and obesity. Current diabetic treatment includes oral agent (monotherapy). She is compliant with treatment all of the time. Her weight is fluctuating minimally. An ACE inhibitor/angiotensin II receptor blocker is being taken.   She reports that her mother had passed away in 10-26-2022 and she has been having to deal with that. This has caused her to stop exercising and eating healthy   She has not been checking her blood sugars at home.  Over the last week she has been walking in the park in the morning multiple times a week and has been working on her diet. She reports " I am determined to get this under control"  .   Wt Readings from Last 3 Encounters:  12/31/15 190 lb 9.6 oz (86.5 kg)  26-Oct-2015 185 lb (83.9 kg)  08/24/15 187 lb 3.2 oz (84.9 kg)    Review of Systems  Constitutional: Negative.   HENT: Negative.   Respiratory: Negative.   Genitourinary: Negative.   Neurological: Negative.   All other systems reviewed and are negative.  Past Medical History:  Diagnosis Date  . Allergy   . Diabetes mellitus without complication ([place])    Type2   . Goiter   . Hypertension   . Hypothyroid   . ITP (idiopathic thrombocytopenic purpura)     Social History   Social History  . Marital status: Single    Spouse name: N/A  . Number of children: N/A  . Years of education: N/A   Occupational History  . Not on file.   Social History Main Topics  . Smoking status: Never Smoker  . Smokeless tobacco: Never Used     Comment: never used tobacco  . Alcohol use 0.6 oz/week    1  Standard drinks or equivalent per week     Comment: 2-3 drinks a week   . Drug use: No  . Sexual activity: Not on file   Other Topics Concern  . Not on file   Social History Narrative   Works for an IT consultant    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 Has two grand babies   She enjoys reading and going to the gym. Hanging out with friends.           Past Surgical History:  Procedure Laterality Date  . no surgeries at this time      Family History  Problem Relation Age of Onset  . Hypertension Mother     fhx  . COPD Father   . Lung cancer Father   . Pancreatic cancer Brother   . Colon cancer Sister     No Known Allergies  Current Outpatient Prescriptions on File Prior to Visit  Medication Sig Dispense Refill  . albuterol (PROVENTIL HFA;VENTOLIN HFA) 108 (90 Base) MCG/ACT inhaler Inhale 2 puffs into the lungs as needed for wheezing. 2 Inhaler 3  . levothyroxine (SYNTHROID, LEVOTHROID) 100 MCG tablet Take 1 tablet (100 mcg total) by mouth daily. 90 tablet 3  . lisinopril (PRINIVIL,ZESTRIL) 10 MG tablet Take 1 tablet (10 mg total) by mouth  daily. 90 tablet 3  . lisinopril (PRINIVIL,ZESTRIL) 5 MG tablet Take 1 tablet (5 mg total) by mouth daily. 90 tablet 3  . metFORMIN (GLUCOPHAGE) 1000 MG tablet One by mouth twice a day 200 tablet 3  . Multiple Vitamin (MULTIVITAMIN WITH MINERALS) TABS Take 1 tablet by mouth daily.     No current facility-administered medications on file prior to visit.     BP 136/84 (BP Location: Left Arm, Patient Position: Sitting, Cuff Size: Normal)   Temp 98.3 F (36.8 C) (Oral)   Wt 190 lb 9.6 oz (86.5 kg)   LMP 12/15/2015 (Approximate)   BMI 31.72 kg/m       Objective:   Physical Exam  Constitutional: She is oriented to person, place, and time. She appears well-developed and well-nourished. No distress.  Cardiovascular: Normal rate, regular rhythm, normal heart sounds and intact distal pulses.  Exam  reveals no gallop and no friction rub.   No murmur heard. Pulmonary/Chest: Effort normal and breath sounds normal. No respiratory distress. She has no wheezes. She has no rales. She exhibits no tenderness.  Abdominal: Soft. Bowel sounds are normal. She exhibits no distension and no mass. There is no tenderness. There is no rebound and no guarding.  Neurological: She is alert and oriented to person, place, and time.  Skin: Skin is warm and dry. No rash noted. She is not diaphoretic. No pallor.  Psychiatric: She has a normal mood and affect. Her behavior is normal. Judgment and thought content normal.  Nursing note and vitals reviewed.     Assessment & Plan:  1. Diabetes 1.5, managed as type 2 ([place]) - she has gone from a 8.5 to 8.0 in one week.  - Encouraged healthy life style and diabetic diet. She has gained 5 pounds since the last visit  - POC HgB A1c - 8.0  - glucose blood (FREESTYLE LITE) test strip; 1 each by Other route 2 (two) times daily.  Dispense: 100 each; Refill: 6 - glucose monitoring kit (FREESTYLE) monitoring kit; 1 each by Does not apply route as needed for other.  Dispense: 1 each; Refill: 0 - Lancets (ACCU-CHEK MULTICLIX) lancets; Use as instructed  Dispense: 100 each; Refill: 12 - Follow up in 3 months or sooner if needed - consider adding Glipizide

## 2015-12-31 NOTE — Progress Notes (Signed)
Pre visit review using our clinic review tool, if applicable. No additional management support is needed unless otherwise documented below in the visit note. 

## 2015-12-31 NOTE — Patient Instructions (Signed)
It was great seeing you today!  Please follow up with me in 3 months. Continue with the good work! I have all the faith in the world in you.

## 2016-01-07 ENCOUNTER — Telehealth: Payer: Self-pay | Admitting: Adult Health

## 2016-01-07 NOTE — Telephone Encounter (Signed)
Autumn Acevedo with [facility] state that the patients insurance Plan do not cover the Freestyle supplies it is considered a non-preferred item and it would cost her more.  The pt can get a Verio meter starter kit free from them.  [facility] will need a Rx for the test strips and lancets for the Verio meter.  Pls give Autumn Acevedo with [facility] a call at [phone] and Reference #:  [id number]

## 2016-01-11 ENCOUNTER — Other Ambulatory Visit: Payer: Self-pay

## 2016-01-11 MED ORDER — ONETOUCH LANCETS MISC: 3 refills | Status: DC

## 2016-01-11 MED ORDER — GLUCOSE BLOOD VI STRP: ORAL_STRIP | 12 refills | Status: DC

## 2016-01-11 NOTE — Telephone Encounter (Signed)
Prescriptions have been sent in.  °

## 2016-01-14 ENCOUNTER — Other Ambulatory Visit: Payer: Self-pay | Admitting: Obstetrics and Gynecology

## 2016-01-14 DIAGNOSIS — Z1231 Encounter for screening mammogram for malignant neoplasm of breast: Secondary | ICD-10-CM

## 2016-02-11 ENCOUNTER — Ambulatory Visit
Admission: RE | Admit: 2016-02-11 | Discharge: 2016-02-11 | Disposition: A | Discharge status: Home / Self Care | Payer: Managed Care, Other (non HMO) | Source: Ambulatory Visit | Attending: Obstetrics and Gynecology | Admitting: Obstetrics and Gynecology

## 2016-02-11 DIAGNOSIS — Z1231 Encounter for screening mammogram for malignant neoplasm of breast: Secondary | ICD-10-CM

## 2016-02-14 ENCOUNTER — Other Ambulatory Visit: Payer: Self-pay | Admitting: Obstetrics and Gynecology

## 2016-02-14 DIAGNOSIS — R928 Other abnormal and inconclusive findings on diagnostic imaging of breast: Secondary | ICD-10-CM

## 2016-02-21 ENCOUNTER — Ambulatory Visit
Admission: RE | Admit: 2016-02-21 | Discharge: 2016-02-21 | Disposition: A | Discharge status: Home / Self Care | Payer: Managed Care, Other (non HMO) | Source: Ambulatory Visit | Attending: Obstetrics and Gynecology | Admitting: Obstetrics and Gynecology

## 2016-02-21 DIAGNOSIS — R928 Other abnormal and inconclusive findings on diagnostic imaging of breast: Secondary | ICD-10-CM

## 2016-03-30 ENCOUNTER — Ambulatory Visit (INDEPENDENT_AMBULATORY_CARE_PROVIDER_SITE_OTHER): Payer: Managed Care, Other (non HMO) | Admitting: Adult Health

## 2016-03-30 DIAGNOSIS — E109 Type 1 diabetes mellitus without complications: Secondary | ICD-10-CM

## 2016-03-30 DIAGNOSIS — E139 Other specified diabetes mellitus without complications: Secondary | ICD-10-CM

## 2016-03-30 LAB — POCT GLYCOSYLATED HEMOGLOBIN (HGB A1C): HEMOGLOBIN A1C: 6.3

## 2016-03-30 NOTE — Progress Notes (Signed)
Subjective:    Patient ID: Autumn Acevedo, female    DOB: 1960-12-10, 55 y.o.   MRN: [id number]  HPI  55 year old female who presents to the office today for three month follow up regarding diabetes. During her last visit her mother had passed away and she was not exercising nor eating well. She had not been checking her blood sugars at home.   Today she reports that she has cut back on carbs, sugars and snacking. She is walking from time to time in the park. She has noticed " I am losing inchs and I feel a lot better."   Her last A1c was 8.0  Review of Systems  Constitutional: Negative.   HENT: Negative.   Respiratory: Negative.   Cardiovascular: Negative.   Genitourinary: Negative.   Neurological: Negative.   All other systems reviewed and are negative.  Past Medical History:  Diagnosis Date  . Allergy   . Diabetes mellitus without complication ([place])    Type2   . Goiter   . Hypertension   . Hypothyroid   . ITP (idiopathic thrombocytopenic purpura)     Social History   Social History  . Marital status: Single    Spouse name: N/A  . Number of children: N/A  . Years of education: N/A   Occupational History  . Not on file.   Social History Main Topics  . Smoking status: Never Smoker  . Smokeless tobacco: Never Used     Comment: never used tobacco  . Alcohol use 0.6 oz/week    1 Standard drinks or equivalent per week     Comment: 2-3 drinks a week   . Drug use: No  . Sexual activity: Not on file   Other Topics Concern  . Not on file   Social History Narrative   Works for an [employer] consultant    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 Has two grand babies   She enjoys reading and going to the gym. Hanging out with friends.           Past Surgical History:  Procedure Laterality Date  . no surgeries at this time      Family History  Problem Relation Age of Onset  . Hypertension Mother     fhx  . COPD Father   . Lung  cancer Father   . Pancreatic cancer Brother   . Colon cancer Sister     No Known Allergies  Current Outpatient Prescriptions on File Prior to Visit  Medication Sig Dispense Refill  . albuterol (PROVENTIL HFA;VENTOLIN HFA) 108 (90 Base) MCG/ACT inhaler Inhale 2 puffs into the lungs as needed for wheezing. 2 Inhaler 3  . glucose blood test strip Test blood sugars 3 times daily. Dx E13.9 100 each 12  . levothyroxine (SYNTHROID, LEVOTHROID) 100 MCG tablet Take 1 tablet (100 mcg total) by mouth daily. 90 tablet 3  . lisinopril (PRINIVIL,ZESTRIL) 10 MG tablet Take 1 tablet (10 mg total) by mouth daily. 90 tablet 3  . lisinopril (PRINIVIL,ZESTRIL) 5 MG tablet Take 1 tablet (5 mg total) by mouth daily. 90 tablet 3  . metFORMIN (GLUCOPHAGE) 1000 MG tablet One by mouth twice a day 200 tablet 3  . Multiple Vitamin (MULTIVITAMIN WITH MINERALS) TABS Take 1 tablet by mouth daily.    . ONE TOUCH LANCETS MISC Test blood sugars 3 times daily. Dx E13.9 90 each 3   No current facility-administered  medications on file prior to visit.     There were no vitals taken for this visit.      Objective:   Physical Exam  Constitutional: She is oriented to person, place, and time. She appears well-developed and well-nourished. No distress.  Cardiovascular: Normal rate, regular rhythm, normal heart sounds and intact distal pulses.  Exam reveals no gallop and no friction rub.   No murmur heard. Pulmonary/Chest: Effort normal and breath sounds normal. No respiratory distress. She has no wheezes. She has no rales. She exhibits no tenderness.  Neurological: She is alert and oriented to person, place, and time.  Skin: Skin is warm and dry. No rash noted. She is not diaphoretic. No erythema.  Psychiatric: She has a normal mood and affect. Her behavior is normal. Judgment and thought content normal.  Nursing note and vitals reviewed.     Assessment & Plan:  1. Diabetes 1.5, managed as type 2 ([place]) - Her A1c has  dropped from 8.0 to 6.3  - POC HgB A1c - Follow up in 3 months or sooner if needed - I would hopefully like to get her diet controlled and start backing off metformin.   Dorothyann Peng, NP

## 2016-06-28 ENCOUNTER — Ambulatory Visit (INDEPENDENT_AMBULATORY_CARE_PROVIDER_SITE_OTHER): Payer: Managed Care, Other (non HMO) | Admitting: Adult Health

## 2016-06-28 VITALS — BP 120/84 | HR 68 | Temp 98.2°F | Wt 176.8 lb

## 2016-06-28 DIAGNOSIS — I10 Essential (primary) hypertension: Secondary | ICD-10-CM

## 2016-06-28 DIAGNOSIS — E109 Type 1 diabetes mellitus without complications: Secondary | ICD-10-CM | POA: Diagnosis not present

## 2016-06-28 DIAGNOSIS — E139 Other specified diabetes mellitus without complications: Secondary | ICD-10-CM

## 2016-06-28 LAB — POCT GLYCOSYLATED HEMOGLOBIN (HGB A1C): Hemoglobin A1C: 6.5

## 2016-06-28 NOTE — Patient Instructions (Signed)
Your A1c is 6.5. This is up a little bit from 6.3.   You are doing excellent.   Lets cut back the lisinopril from 15 mg to 10 mg. Monitor BP at home and let me know if if goes about 140   I am going to keep you on the current dose of Metformin

## 2016-06-28 NOTE — Progress Notes (Signed)
Subjective:    Patient ID: Autumn Acevedo, female    DOB: 11/12/1960, 56 y.o.   MRN: [id number]  HPI  56 year old female who presents to the clinic today for 3 month follow up regarding diabetes management. She is currently taking Metformin 1000mg  BID. Her last A1c was 6.3 in December 2017.   She continues to work on diet and is not eating carbs or sugars  Her blood sugars have been routinely in the 120-130's.   She is also walking about 1.5 miles 3 x a week   Her blood pressure is well controlled at 120/84. She has not taken her medications yet  Wt Readings from Last 3 Encounters:  06/28/16 176 lb 12.8 oz (80.2 kg)  12/31/15 190 lb 9.6 oz (86.5 kg)  09/30/15 185 lb (83.9 kg)     Review of Systems  Constitutional: Negative.   Respiratory: Negative.   Cardiovascular: Negative.   Endocrine: Negative.   Genitourinary: Negative.   All other systems reviewed and are negative.    Past Medical History:  Diagnosis Date  . Allergy   . Diabetes mellitus without complication ([place])    Type2   . Goiter   . Hypertension   . Hypothyroid   . ITP (idiopathic thrombocytopenic purpura)     Social History   Social History  . Marital status: Single    Spouse name: N/A  . Number of children: N/A  . Years of education: N/A   Occupational History  . Not on file.   Social History Main Topics  . Smoking status: Never Smoker  . Smokeless tobacco: Never Used     Comment: never used tobacco  . Alcohol use 0.6 oz/week    1 Standard drinks or equivalent per week     Comment: 2-3 drinks a week   . Drug use: No  . Sexual activity: Not on file   Other Topics Concern  . Not on file   Social History Narrative   Works for an IT consultant    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 Has two grand babies   She enjoys reading and going to the gym. Hanging out with friends.           Past Surgical History:  Procedure Laterality Date  . no surgeries  at this time      Family History  Problem Relation Age of Onset  . Hypertension Mother     fhx  . COPD Father   . Lung cancer Father   . Pancreatic cancer Brother   . Colon cancer Sister     No Known Allergies  Current Outpatient Prescriptions on File Prior to Visit  Medication Sig Dispense Refill  . albuterol (PROVENTIL HFA;VENTOLIN HFA) 108 (90 Base) MCG/ACT inhaler Inhale 2 puffs into the lungs as needed for wheezing. 2 Inhaler 3  . glucose blood test strip Test blood sugars 3 times daily. Dx E13.9 100 each 12  . levothyroxine (SYNTHROID, LEVOTHROID) 100 MCG tablet Take 1 tablet (100 mcg total) by mouth daily. 90 tablet 3  . lisinopril (PRINIVIL,ZESTRIL) 10 MG tablet Take 1 tablet (10 mg total) by mouth daily. 90 tablet 3  . lisinopril (PRINIVIL,ZESTRIL) 5 MG tablet Take 1 tablet (5 mg total) by mouth daily. 90 tablet 3  . metFORMIN (GLUCOPHAGE) 1000 MG tablet One by mouth twice a day 200 tablet 3  . Multiple Vitamin (MULTIVITAMIN WITH MINERALS) TABS Take 1  tablet by mouth daily.    . ONE TOUCH LANCETS MISC Test blood sugars 3 times daily. Dx E13.9 90 each 3   No current facility-administered medications on file prior to visit.     BP 120/84   Pulse 68   Temp 98.2 F (36.8 C)   Wt 176 lb 12.8 oz (80.2 kg)   BMI 29.42 kg/m       Objective:   Physical Exam  Constitutional: She is oriented to person, place, and time. She appears well-developed and well-nourished. No distress.  Cardiovascular: Normal rate, regular rhythm and intact distal pulses.  Exam reveals no gallop and no friction rub.   No murmur heard. Pulmonary/Chest: Effort normal and breath sounds normal. No respiratory distress. She has no wheezes. She has no rales. She exhibits no tenderness.  Neurological: She is alert and oriented to person, place, and time.  Skin: Skin is warm and dry. She is not diaphoretic. No erythema. No pallor.  Psychiatric: She has a normal mood and affect. Her behavior is normal.  Judgment and thought content normal.  Nursing note and vitals reviewed.     Assessment & Plan:  1. Diabetes 1.5, managed as type 2 ([place]) - POC HgB A1c- 6.5  - Stay on current dose of metformin  - Follow up in June for CPE  - Continue to lose weight through healthy eating and exercise   2. Essential hypertension - Decrease lisinopril from 15 mg to 10 mg.  - Monitor BP at home  - Return precautions given  - Will follow up with at physical   Dorothyann Peng, NP

## 2016-08-27 ENCOUNTER — Other Ambulatory Visit: Payer: Self-pay | Admitting: Adult Health

## 2016-08-27 DIAGNOSIS — Z760 Encounter for issue of repeat prescription: Secondary | ICD-10-CM

## 2016-08-27 DIAGNOSIS — I10 Essential (primary) hypertension: Secondary | ICD-10-CM

## 2016-08-27 DIAGNOSIS — E139 Other specified diabetes mellitus without complications: Secondary | ICD-10-CM

## 2016-09-20 ENCOUNTER — Other Ambulatory Visit: Payer: Self-pay | Admitting: Adult Health

## 2016-09-20 DIAGNOSIS — E139 Other specified diabetes mellitus without complications: Secondary | ICD-10-CM

## 2016-09-20 DIAGNOSIS — E039 Hypothyroidism, unspecified: Secondary | ICD-10-CM

## 2016-09-20 DIAGNOSIS — I10 Essential (primary) hypertension: Secondary | ICD-10-CM

## 2016-09-20 DIAGNOSIS — Z0000 Encounter for general adult medical examination without abnormal findings: Secondary | ICD-10-CM

## 2016-09-21 ENCOUNTER — Other Ambulatory Visit (INDEPENDENT_AMBULATORY_CARE_PROVIDER_SITE_OTHER): Payer: Managed Care, Other (non HMO)

## 2016-09-21 DIAGNOSIS — I10 Essential (primary) hypertension: Secondary | ICD-10-CM

## 2016-09-21 DIAGNOSIS — E039 Hypothyroidism, unspecified: Secondary | ICD-10-CM

## 2016-09-21 DIAGNOSIS — E109 Type 1 diabetes mellitus without complications: Secondary | ICD-10-CM

## 2016-09-21 DIAGNOSIS — Z0000 Encounter for general adult medical examination without abnormal findings: Secondary | ICD-10-CM | POA: Diagnosis not present

## 2016-09-21 DIAGNOSIS — E139 Other specified diabetes mellitus without complications: Secondary | ICD-10-CM

## 2016-09-21 LAB — LIPID PANEL
CHOLESTEROL: 181 mg/dL (ref 0–200)
HDL: 54 mg/dL (ref >39.00)
LDL Cholesterol: 103 mg/dL — ABNORMAL HIGH (ref 0–99)
NonHDL: 127.06
TRIGLYCERIDES: 120 mg/dL (ref 0.0–149.0)
Total CHOL/HDL Ratio: 3
VLDL: 24 mg/dL (ref 0.0–40.0)

## 2016-09-21 LAB — HEPATIC FUNCTION PANEL
ALT: 15 U/L (ref 0–35)
AST: 15 U/L (ref 0–37)
Albumin: 4.6 g/dL (ref 3.5–5.2)
Alkaline Phosphatase: 102 U/L (ref 39–117)
BILIRUBIN DIRECT: 0.1 mg/dL (ref 0.0–0.3)
BILIRUBIN TOTAL: 0.5 mg/dL (ref 0.2–1.2)
Total Protein: 7.2 g/dL (ref 6.0–8.3)

## 2016-09-21 LAB — BASIC METABOLIC PANEL
BUN: 12 mg/dL (ref 6–23)
CHLORIDE: 102 meq/L (ref 96–112)
CO2: 31 meq/L (ref 19–32)
CREATININE: 0.88 mg/dL (ref 0.40–1.20)
Calcium: 10.1 mg/dL (ref 8.4–10.5)
GFR: 85.65 mL/min (ref >60.00)
Glucose, Bld: 159 mg/dL — ABNORMAL HIGH (ref 70–99)
Potassium: 4.9 mEq/L (ref 3.5–5.1)
Sodium: 139 mEq/L (ref 135–145)

## 2016-09-21 LAB — CBC WITH DIFFERENTIAL/PLATELET
BASOS PCT: 0.9 % (ref 0.0–3.0)
Basophils Absolute: 0 10*3/uL (ref 0.0–0.1)
EOS ABS: 0.1 10*3/uL (ref 0.0–0.7)
Eosinophils Relative: 2.1 % (ref 0.0–5.0)
HCT: 43.7 % (ref 36.0–46.0)
Hemoglobin: 13.8 g/dL (ref 12.0–15.0)
Lymphocytes Relative: 47.7 % — ABNORMAL HIGH (ref 12.0–46.0)
Lymphs Abs: 2.7 10*3/uL (ref 0.7–4.0)
MCHC: 31.6 g/dL (ref 30.0–36.0)
MCV: 71.7 fl — ABNORMAL LOW (ref 78.0–100.0)
MONO ABS: 0.4 10*3/uL (ref 0.1–1.0)
Monocytes Relative: 7.8 % (ref 3.0–12.0)
NEUTROS ABS: 2.4 10*3/uL (ref 1.4–7.7)
Neutrophils Relative %: 41.5 % — ABNORMAL LOW (ref 43.0–77.0)
PLATELETS: 250 10*3/uL (ref 150.0–400.0)
RBC: 6.08 Mil/uL — ABNORMAL HIGH (ref 3.87–5.11)
RDW: 14.7 % (ref 11.5–15.5)
WBC: 5.7 10*3/uL (ref 4.0–10.5)

## 2016-09-21 LAB — TSH: TSH: 0.05 u[IU]/mL — ABNORMAL LOW (ref 0.35–4.50)

## 2016-09-21 LAB — HEMOGLOBIN A1C: Hgb A1c MFr Bld: 7.7 % — ABNORMAL HIGH (ref 4.6–6.5)

## 2016-09-22 ENCOUNTER — Other Ambulatory Visit: Payer: Self-pay | Admitting: Adult Health

## 2016-09-22 DIAGNOSIS — Z760 Encounter for issue of repeat prescription: Secondary | ICD-10-CM

## 2016-09-22 DIAGNOSIS — I10 Essential (primary) hypertension: Secondary | ICD-10-CM

## 2016-09-28 ENCOUNTER — Ambulatory Visit (INDEPENDENT_AMBULATORY_CARE_PROVIDER_SITE_OTHER): Payer: Managed Care, Other (non HMO) | Admitting: Adult Health

## 2016-09-28 ENCOUNTER — Encounter: Payer: Self-pay | Admitting: Adult Health

## 2016-09-28 VITALS — BP 126/80 | Temp 98.5°F | Ht 65.5 in | Wt 178.0 lb

## 2016-09-28 DIAGNOSIS — E039 Hypothyroidism, unspecified: Secondary | ICD-10-CM | POA: Diagnosis not present

## 2016-09-28 DIAGNOSIS — E139 Other specified diabetes mellitus without complications: Secondary | ICD-10-CM

## 2016-09-28 DIAGNOSIS — Z0000 Encounter for general adult medical examination without abnormal findings: Secondary | ICD-10-CM

## 2016-09-28 DIAGNOSIS — I10 Essential (primary) hypertension: Secondary | ICD-10-CM | POA: Diagnosis not present

## 2016-09-28 DIAGNOSIS — Z23 Encounter for immunization: Secondary | ICD-10-CM | POA: Diagnosis not present

## 2016-09-28 DIAGNOSIS — Z0001 Encounter for general adult medical examination with abnormal findings: Secondary | ICD-10-CM | POA: Diagnosis not present

## 2016-09-28 DIAGNOSIS — E109 Type 1 diabetes mellitus without complications: Secondary | ICD-10-CM | POA: Diagnosis not present

## 2016-09-28 MED ORDER — LEVOTHYROXINE SODIUM 88 MCG PO TABS: 88.0000 ug | ORAL_TABLET | Freq: Every day | ORAL | 3 refills | Status: DC

## 2016-09-28 NOTE — Progress Notes (Signed)
Subjective:    Patient ID: Autumn Acevedo, female    DOB: 02-07-1961, 56 y.o.   MRN: [id number]  HPI  Patient presents for yearly preventative medicine examination. She is a pleasant 56 year old female who  has a past medical history of Allergy; Diabetes mellitus without complication ([place]); Goiter; Hypertension; Hypothyroid; and ITP (idiopathic thrombocytopenic purpura).   All immunizations and health maintenance protocols were reviewed with the patient and needed orders were placed.  Appropriate screening laboratory values were ordered for the patient including screening of hyperlipidemia, renal function and hepatic function. If indicated by BPH, a PSA was ordered.  Medication reconciliation,  past medical history, social history, problem list and allergies were reviewed in detail with the patient  Goals were established with regard to weight loss, exercise, and  diet in compliance with medications. She is exercising three days per week. This last month she did not follow a diabetic diet   She is up to date on her colonoscopy ( 2017), mammogram, dental and vision screens. She does do home breast exams and has not noticed any changes in her breasts  Review of Systems  Constitutional: Negative.   HENT: Negative.   Eyes: Negative.   Respiratory: Negative.   Cardiovascular: Negative.   Gastrointestinal: Negative.   Endocrine: Negative.   Genitourinary: Negative.   Musculoskeletal: Negative.   Skin: Negative.   Allergic/Immunologic: Negative.   Hematological: Negative.   Psychiatric/Behavioral: Negative.   All other systems reviewed and are negative.  Past Medical History:  Diagnosis Date  . Allergy   . 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 Goiter   . Hypertension   . Hypothyroid   . ITP (idiopathic thrombocytopenic purpura)     Social History   Social History  . Marital status: Single    Spouse name: N/A  . Number of children: N/A  . Years of  education: N/A   Occupational History  . Not on file.   Social History Main Topics  . Smoking status: Never Smoker  . Smokeless tobacco: Never Used     Comment: never used tobacco  . Alcohol use 0.6 oz/week    1 Standard drinks or equivalent per week     Comment: 2-3 drinks a week   . Drug use: No  . Sexual activity: Not on file   Other Topics Concern  . Not on file   Social History Narrative   Works for an IT consultant    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 Has two grand babies   She enjoys reading and going to the gym. Hanging out with friends.           Past Surgical History:  Procedure Laterality Date  . no surgeries at this time      Family History  Problem Relation Age of Onset  . Hypertension Mother        fhx  . COPD Father   . Lung cancer Father   . Pancreatic cancer Brother   . Colon cancer Sister     No Known Allergies  Current Outpatient Prescriptions on File Prior to Visit  Medication Sig Dispense Refill  . albuterol (PROVENTIL HFA;VENTOLIN HFA) 108 (90 Base) MCG/ACT inhaler Inhale 2 puffs into the lungs as needed for wheezing. 2 Inhaler 3  . glucose blood test strip Test blood sugars 3 times daily. Dx E13.9 100 each 12  . levothyroxine (  SYNTHROID, LEVOTHROID) 100 MCG tablet Take 1 tablet (100 mcg total) by mouth daily. 90 tablet 3  . lisinopril (PRINIVIL,ZESTRIL) 10 MG tablet TAKE 1 TABLET DAILY 90 tablet 1  . lisinopril (PRINIVIL,ZESTRIL) 5 MG tablet TAKE 1 TABLET DAILY 90 tablet 3  . metFORMIN (GLUCOPHAGE) 1000 MG tablet TAKE 1 TABLET TWICE A DAY 180 tablet 1  . Multiple Vitamin (MULTIVITAMIN WITH MINERALS) TABS Take 1 tablet by mouth daily.    . ONE TOUCH LANCETS MISC Test blood sugars 3 times daily. Dx E13.9 90 each 3   No current facility-administered medications on file prior to visit.     BP 126/80 (BP Location: Left Arm)   Temp 98.5 F (36.9 C) (Oral)   Ht 5' 5.5" (1.664 m)   Wt 178 lb (80.7 kg)    LMP 05/17/2016   BMI 29.17 kg/m       Objective:   Physical Exam  Constitutional: She is oriented to person, place, and time. She appears well-developed and well-nourished. No distress.  HENT:  Head: Normocephalic and atraumatic.  Right Ear: External ear normal.  Left Ear: External ear normal.  Nose: Nose normal.  Mouth/Throat: Oropharynx is clear and moist. No oropharyngeal exudate.  Eyes: Conjunctivae and EOM are normal. Pupils are equal, round, and reactive to light. Right eye exhibits no discharge. Left eye exhibits no discharge.  Neck: Normal range of motion. Neck supple. No JVD present. No tracheal deviation present. No thyromegaly present.  Goiter present   Cardiovascular: Normal rate, regular rhythm, normal heart sounds and intact distal pulses.  Exam reveals no gallop and no friction rub.   No murmur heard. Pulmonary/Chest: Effort normal and breath sounds normal. No stridor. No respiratory distress. She has no wheezes. She has no rales. She exhibits no tenderness.  Abdominal: Soft. Bowel sounds are normal. She exhibits no distension and no mass. There is no tenderness. There is no rebound and no guarding.  Genitourinary:  Genitourinary Comments: Breast Exam: No masses, dimpling, or discharge noted.   Musculoskeletal: Normal range of motion. She exhibits no edema, tenderness or deformity.  Lymphadenopathy:    She has no cervical adenopathy.  Neurological: She is alert and oriented to person, place, and time. She has normal reflexes. She displays normal reflexes. No cranial nerve deficit. Coordination normal.  Skin: Skin is warm and dry. No rash noted. No erythema. No pallor.  Psychiatric: She has a normal mood and affect. Her behavior is normal. Judgment and thought content normal.  Nursing note and vitals reviewed.     Assessment & Plan:  1. Routine general medical examination at a health care facility - We reviewed labs in detail and all questions answered.  - Educated  on the importance of diet and exercise   2. Essential hypertension - Well controlled with lisinopril. No change   3. Diabetes 1.5, managed as type 2 ([place]) - A1c has increased from 6.5 to 7.7. This is do to her poor diet over the last 2 months.  - No change at this time  - Follow up in 3 months for recheck  4. Hypothyroidism, unspecified type - She is over medicated. Will decrease synthroid to 88 mcg. Recheck when she comes in for her A1c check  - levothyroxine (SYNTHROID, LEVOTHROID) 88 MCG tablet; Take 1 tablet (88 mcg total) by mouth daily.  Dispense: 90 tablet; Refill: 3  Dorothyann Peng, NP

## 2016-09-28 NOTE — Addendum Note (Signed)
Addended by: Miles Costain on: 09/28/2016 07:41 AM   Modules accepted: Orders

## 2016-09-28 NOTE — Patient Instructions (Signed)
It was great seeing you today   Please work on your blood sugars. A1c is up over a point. I will see you in three months  I am going to send in a new prescription for synthroid

## 2016-09-29 LAB — HM DIABETES EYE EXAM

## 2016-10-03 ENCOUNTER — Encounter: Payer: Self-pay | Admitting: Adult Health

## 2016-12-29 ENCOUNTER — Encounter: Payer: Self-pay | Admitting: Adult Health

## 2016-12-29 ENCOUNTER — Ambulatory Visit (INDEPENDENT_AMBULATORY_CARE_PROVIDER_SITE_OTHER): Payer: Managed Care, Other (non HMO) | Admitting: Adult Health

## 2016-12-29 VITALS — BP 116/80 | Temp 98.6°F | Wt 182.0 lb

## 2016-12-29 DIAGNOSIS — E109 Type 1 diabetes mellitus without complications: Secondary | ICD-10-CM | POA: Diagnosis not present

## 2016-12-29 DIAGNOSIS — E039 Hypothyroidism, unspecified: Secondary | ICD-10-CM | POA: Diagnosis not present

## 2016-12-29 DIAGNOSIS — E139 Other specified diabetes mellitus without complications: Secondary | ICD-10-CM

## 2016-12-29 LAB — T4, FREE: FREE T4: 1.11 ng/dL (ref 0.60–1.60)

## 2016-12-29 LAB — TSH: TSH: 1.22 u[IU]/mL (ref 0.35–4.50)

## 2016-12-29 LAB — HEMOGLOBIN A1C: Hgb A1c MFr Bld: 9 % — ABNORMAL HIGH (ref 4.6–6.5)

## 2016-12-29 LAB — T3, FREE: T3, Free: 4.5 pg/mL — ABNORMAL HIGH (ref 2.3–4.2)

## 2016-12-29 NOTE — Progress Notes (Signed)
Subjective:    Patient ID: Autumn Acevedo, female    DOB: 1960/09/12, 56 y.o.   MRN: [id number]  HPI  56 year old female who  has a past medical history of Allergy; Diabetes mellitus without complication ([place]); Goiter; Hypertension; Hypothyroid; and ITP (idiopathic thrombocytopenic purpura).   She presents to the office for three month follow up regarding diabetes. During her physical her A1c had increased from 6.5 to 7.7. This was mostly due to poor dietary choices that she had been making. The decision was made not to change her medication regimen which included Metformin 1000 mg BID as she was going to work on her diet and exercise regimens. She was also taking Synthroid 100 mcg daily. Her tsh was 0.05. She was decreased to 88 mcg daily. Last [place] of thyroid was in 2009    Today in the office she reports that she has been traveling and not abiding by a diabetic diet. Her blood sugars have been running in the 150's.   Wt Readings from Last 3 Encounters:  12/29/16 182 lb (82.6 kg)  09/28/16 178 lb (80.7 kg)  06/28/16 176 lb 12.8 oz (80.2 kg)   Review of Systems See HPI   Past Medical History:  Diagnosis Date  . Allergy   . 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 Goiter   . Hypertension   . Hypothyroid   . ITP (idiopathic thrombocytopenic purpura)     Social History   Social History  . Marital status: Single    Spouse name: N/A  . Number of children: N/A  . Years of education: N/A   Occupational History  . Not on file.   Social History Main Topics  . Smoking status: Never Smoker  . Smokeless tobacco: Never Used     Comment: never used tobacco  . Alcohol use 0.6 oz/week    1 Standard drinks or equivalent per week     Comment: 2-3 drinks a week   . Drug use: No  . Sexual activity: Not on file   Other Topics Concern  . 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 Has two grand babies   She enjoys reading and going to the gym. Hanging out with friends.           Past Surgical History:  Procedure Laterality Date  . no surgeries at this time      Family History  Problem Relation Age of Onset  . Hypertension Mother        fhx  . COPD Father   . Lung cancer Father   . Pancreatic cancer Brother   . Colon cancer Sister     No Known Allergies  Current Outpatient Prescriptions on File Prior to Visit  Medication Sig Dispense Refill  . albuterol (PROVENTIL HFA;VENTOLIN HFA) 108 (90 Base) MCG/ACT inhaler Inhale 2 puffs into the lungs as needed for wheezing. 2 Inhaler 3  . glucose blood test strip Test blood sugars 3 times daily. Dx E13.9 100 each 12  . levothyroxine (SYNTHROID, LEVOTHROID) 88 MCG tablet Take 1 tablet (88 mcg total) by mouth daily. 90 tablet 3  . lisinopril (PRINIVIL,ZESTRIL) 10 MG tablet TAKE 1 TABLET DAILY 90 tablet 1  . lisinopril (PRINIVIL,ZESTRIL) 5 MG tablet TAKE 1 TABLET DAILY 90 tablet 3  . metFORMIN (  GLUCOPHAGE) 1000 MG tablet TAKE 1 TABLET TWICE A DAY 180 tablet 1  . Multiple Vitamin (MULTIVITAMIN WITH MINERALS) TABS Take 1 tablet by mouth daily.    . ONE TOUCH LANCETS MISC Test blood sugars 3 times daily. Dx E13.9 90 each 3   No current facility-administered medications on file prior to visit.     BP 116/80 (BP Location: Left Arm)   Temp 98.6 F (37 C) (Oral)   Wt 182 lb (82.6 kg)   BMI 29.83 kg/m       Objective:   Physical Exam  Constitutional: She is oriented to person, place, and time. She appears well-developed and well-nourished. No distress.  Neck:  Goiter present    Cardiovascular: Normal rate, regular rhythm, normal heart sounds and intact distal pulses.  Exam reveals no gallop and no friction rub.   No murmur heard. Pulmonary/Chest: Effort normal and breath sounds normal. No respiratory distress. She has no wheezes. She has no rales. She exhibits no tenderness.  Neurological: She  is alert and oriented to person, place, and time.  Skin: Skin is warm and dry. No rash noted. She is not diaphoretic. No erythema. No pallor.  Psychiatric: She has a normal mood and affect. Her behavior is normal. Judgment and thought content normal.  Nursing note and vitals reviewed.     Assessment & Plan:  1. Diabetes 1.5, managed as type 2 ([place]) - Hemoglobin A1c - Consider adding Glipizide  - educated on the important of diet and exercise  - Follow up in 3 months   2. Hypothyroidism, unspecified type  - TSH - T4, Free - US Soft Tissue Head/Neck; Future - T3, Free  Dorothyann Peng, NP

## 2017-01-03 ENCOUNTER — Other Ambulatory Visit: Payer: Self-pay | Admitting: Adult Health

## 2017-01-03 ENCOUNTER — Ambulatory Visit
Admission: RE | Admit: 2017-01-03 | Discharge: 2017-01-03 | Disposition: A | Discharge status: Home / Self Care | Payer: Managed Care, Other (non HMO) | Source: Ambulatory Visit | Attending: Adult Health | Admitting: Adult Health

## 2017-01-03 DIAGNOSIS — E039 Hypothyroidism, unspecified: Secondary | ICD-10-CM

## 2017-01-12 ENCOUNTER — Encounter: Payer: Self-pay | Admitting: Adult Health

## 2017-01-23 ENCOUNTER — Other Ambulatory Visit: Payer: Self-pay | Admitting: Adult Health

## 2017-01-23 DIAGNOSIS — Z1231 Encounter for screening mammogram for malignant neoplasm of breast: Secondary | ICD-10-CM

## 2017-02-13 ENCOUNTER — Encounter: Payer: Self-pay | Admitting: Radiology

## 2017-02-13 ENCOUNTER — Ambulatory Visit
Admission: RE | Admit: 2017-02-13 | Discharge: 2017-02-13 | Disposition: A | Discharge status: Home / Self Care | Payer: Managed Care, Other (non HMO) | Source: Ambulatory Visit | Attending: Adult Health | Admitting: Adult Health

## 2017-02-13 DIAGNOSIS — Z1231 Encounter for screening mammogram for malignant neoplasm of breast: Secondary | ICD-10-CM

## 2017-03-21 ENCOUNTER — Other Ambulatory Visit: Payer: Self-pay | Admitting: Adult Health

## 2017-03-21 DIAGNOSIS — I10 Essential (primary) hypertension: Secondary | ICD-10-CM

## 2017-03-21 DIAGNOSIS — Z760 Encounter for issue of repeat prescription: Secondary | ICD-10-CM

## 2017-03-21 DIAGNOSIS — E139 Other specified diabetes mellitus without complications: Secondary | ICD-10-CM

## 2017-03-21 NOTE — Telephone Encounter (Signed)
Sent to the pharmacy by e-scribe. 

## 2017-04-03 ENCOUNTER — Ambulatory Visit: Payer: Managed Care, Other (non HMO) | Admitting: Adult Health

## 2017-04-05 ENCOUNTER — Ambulatory Visit: Payer: Managed Care, Other (non HMO) | Admitting: Adult Health

## 2017-04-11 ENCOUNTER — Ambulatory Visit (INDEPENDENT_AMBULATORY_CARE_PROVIDER_SITE_OTHER): Payer: Managed Care, Other (non HMO) | Admitting: Adult Health

## 2017-04-11 ENCOUNTER — Encounter: Payer: Self-pay | Admitting: Adult Health

## 2017-04-11 VITALS — BP 150/96 | Temp 98.3°F | Wt 187.0 lb

## 2017-04-11 DIAGNOSIS — E109 Type 1 diabetes mellitus without complications: Secondary | ICD-10-CM | POA: Diagnosis not present

## 2017-04-11 DIAGNOSIS — E139 Other specified diabetes mellitus without complications: Secondary | ICD-10-CM

## 2017-04-11 LAB — POCT GLYCOSYLATED HEMOGLOBIN (HGB A1C): HEMOGLOBIN A1C: 8.2

## 2017-04-11 NOTE — Progress Notes (Signed)
Subjective:    Patient ID: Autumn Acevedo, female    DOB: 10-05-1960, 56 y.o.   MRN: [id number]  HPI  55 year old female who  has a past medical history of Allergy, Diabetes mellitus without complication ([place]), Goiter, Hypertension, Hypothyroid, and ITP (idiopathic thrombocytopenic purpura). She presents to the office today for three month follow up regarding diabetes. Her last A1c was 9.0. It was my recommendation to add glipizide, but she refused as she wanted to work on diet and exercise. During the last visit she reported that she had been traveling quite a bit and was not adhering to a diabetic diet. She is currently maintained on Glipizide 1000 mg BID   Today in the office she reports that her blood sugars have been between 120-145. She has not experienced any blood sugars above 145. She has started drinking Glucerna shakes during the day but feels as though her late night eating is causing her blood sugars to be elevated.    Wt Readings from Last 3 Encounters:  04/11/17 187 lb (84.8 kg)  12/29/16 182 lb (82.6 kg)  09/28/16 178 lb (80.7 kg)    Review of Systems See HPI   Past Medical History:  Diagnosis Date  . Allergy   . 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 Goiter   . Hypertension   . Hypothyroid   . ITP (idiopathic thrombocytopenic purpura)     Social History   Socioeconomic History  . Marital status: Single    Spouse name: Not on file  . Number of children: Not on file  . Years of education: Not on file  . Highest education level: Not on file  Social Needs  . Financial resource strain: Not on file  . Food insecurity - worry: Not on file  . Food insecurity - inability: Not on file  . Transportation needs - medical: Not on file  . Transportation needs - non-medical: Not on file  Occupational History  . Not on file  Tobacco Use  . Smoking status: Never Smoker  . Smokeless tobacco: Never Used  . Tobacco comment: never used tobacco    Substance and Sexual Activity  . Alcohol use: Yes    Alcohol/week: 0.6 oz    Types: 1 Standard drinks or equivalent per week    Comment: 2-3 drinks a week   . Drug use: No  . Sexual activity: Not on file  Other Topics Concern  . 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 Has two grand babies   She enjoys reading and going to the gym. Hanging out with friends.        Past Surgical History:  Procedure Laterality Date  . no surgeries at this time      Family History  Problem Relation Age of Onset  . Hypertension Mother        fhx  . COPD Father   . Lung cancer Father   . Pancreatic cancer Brother   . Colon cancer Sister     No Known Allergies  Current Outpatient Medications on File Prior to Visit  Medication Sig Dispense Refill  . albuterol (PROVENTIL HFA;VENTOLIN HFA) 108 (90 Base) MCG/ACT inhaler Inhale 2 puffs into the lungs as needed for wheezing. 2 Inhaler 3  . glucose blood test strip Test blood sugars 3  times daily. Dx E13.9 100 each 12  . levothyroxine (SYNTHROID, LEVOTHROID) 88 MCG tablet Take 1 tablet (88 mcg total) by mouth daily. 90 tablet 3  . lisinopril (PRINIVIL,ZESTRIL) 10 MG tablet TAKE 1 TABLET DAILY 90 tablet 1  . lisinopril (PRINIVIL,ZESTRIL) 5 MG tablet TAKE 1 TABLET DAILY 90 tablet 3  . metFORMIN (GLUCOPHAGE) 1000 MG tablet TAKE 1 TABLET TWICE A DAY 180 tablet 1  . Multiple Vitamin (MULTIVITAMIN WITH MINERALS) TABS Take 1 tablet by mouth daily.    . ONE TOUCH LANCETS MISC Test blood sugars 3 times daily. Dx E13.9 90 each 3   No current facility-administered medications on file prior to visit.     BP (!) 150/96 (BP Location: Left Arm)   Temp 98.3 F (36.8 C) (Oral)   Wt 187 lb (84.8 kg)   LMP 05/17/2016   BMI 30.65 kg/m       Objective:   Physical Exam  Constitutional: She is oriented to person, place, and time. She appears well-developed and  well-nourished. No distress.  Obese   Cardiovascular: Normal rate, regular rhythm, normal heart sounds and intact distal pulses. Exam reveals no gallop and no friction rub.  No murmur heard. Pulmonary/Chest: Effort normal and breath sounds normal. No respiratory distress. She has no wheezes. She has no rales. She exhibits no tenderness.  Neurological: She is alert and oriented to person, place, and time.  Skin: Skin is warm and dry. No rash noted. She is not diaphoretic. No erythema. No pallor.  Psychiatric: She has a normal mood and affect. Her behavior is normal. Judgment and thought content normal.  Nursing note and vitals reviewed.     Assessment & Plan:  1. Diabetes 1.5, managed as type 2 ([place]) - POC HgB A1c- 8.2 - she has improved.  - She would like another 3 months to get her diet on track. If her A1c is still not down to below 7 at that time, she will go on glipizide  - Follow up in 3 months  - Reviewed healthy eating and exercise   Dorothyann Peng, NP

## 2017-07-10 ENCOUNTER — Encounter: Payer: Self-pay | Admitting: Adult Health

## 2017-07-10 ENCOUNTER — Ambulatory Visit (INDEPENDENT_AMBULATORY_CARE_PROVIDER_SITE_OTHER): Payer: Managed Care, Other (non HMO) | Admitting: Adult Health

## 2017-07-10 VITALS — BP 130/80 | Temp 98.2°F | Wt 181.0 lb

## 2017-07-10 DIAGNOSIS — E109 Type 1 diabetes mellitus without complications: Secondary | ICD-10-CM

## 2017-07-10 DIAGNOSIS — E139 Other specified diabetes mellitus without complications: Secondary | ICD-10-CM

## 2017-07-10 LAB — POCT GLYCOSYLATED HEMOGLOBIN (HGB A1C): HEMOGLOBIN A1C: 8.4

## 2017-07-10 NOTE — Progress Notes (Signed)
Subjective:    Patient ID: Autumn Acevedo, female    DOB: 15-Oct-1960, 57 y.o.   MRN: [id number]  HPI 57 year old female who  has a past medical history of Allergy, Diabetes mellitus without complication ([place]), Goiter, Hypertension, Hypothyroid, and ITP (idiopathic thrombocytopenic purpura).  She presents to the office today for 87-month follow-up regarding diabetes.  During her visit in December her last A1c dropped from 9 to 8.2.  She is currently taking metformin 1000 mg twice daily and working on lifestyle modifications.  Today in the office she reports that her blood sugar readings at home have been in 120-130 but have been as high as 200's after meals.   She has been going to [employer] and is going every morning. She has recently started weight watchers and has noticed that there is to many carbs in her diet.    Wt Readings from Last 3 Encounters:  07/10/17 181 lb (82.1 kg)  04/11/17 187 lb (84.8 kg)  12/29/16 182 lb (82.6 kg)   Review of Systems See HPI   Past Medical History:  Diagnosis Date  . Allergy   . 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 Goiter   . Hypertension   . Hypothyroid   . ITP (idiopathic thrombocytopenic purpura)     Social History   Socioeconomic History  . Marital status: Single    Spouse name: Not on file  . Number of children: Not on file  . Years of education: Not on file  . Highest education level: Not on file  Social Needs  . Financial resource strain: Not on file  . Food insecurity - worry: Not on file  . Food insecurity - inability: Not on file  . Transportation needs - medical: Not on file  . Transportation needs - non-medical: Not on file  Occupational History  . Not on file  Tobacco Use  . Smoking status: Never Smoker  . Smokeless tobacco: Never Used  . Tobacco comment: never used tobacco  Substance and Sexual Activity  . Alcohol use: Yes    Alcohol/week: 0.6 oz    Types: 1 Standard drinks or equivalent per  week    Comment: 2-3 drinks a week   . Drug use: No  . Sexual activity: Not on file  Other Topics Concern  . 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 Has two grand babies   She enjoys reading and going to the gym. Hanging out with friends.        Past Surgical History:  Procedure Laterality Date  . no surgeries at this time      Family History  Problem Relation Age of Onset  . Hypertension Mother        fhx  . COPD Father   . Lung cancer Father   . Pancreatic cancer Brother   . Colon cancer Sister     No Known Allergies  Current Outpatient Medications on File Prior to Visit  Medication Sig Dispense Refill  . albuterol (PROVENTIL HFA;VENTOLIN HFA) 108 (90 Base) MCG/ACT inhaler Inhale 2 puffs into the lungs as needed for wheezing. 2 Inhaler 3  . glucose blood test strip Test blood sugars 3 times daily. Dx E13.9 100 each 12  . levothyroxine (SYNTHROID, LEVOTHROID) 88 MCG tablet Take 1 tablet (88 mcg total)  by mouth daily. 90 tablet 3  . lisinopril (PRINIVIL,ZESTRIL) 10 MG tablet TAKE 1 TABLET DAILY 90 tablet 1  . lisinopril (PRINIVIL,ZESTRIL) 5 MG tablet TAKE 1 TABLET DAILY 90 tablet 3  . metFORMIN (GLUCOPHAGE) 1000 MG tablet TAKE 1 TABLET TWICE A DAY 180 tablet 1  . Multiple Vitamin (MULTIVITAMIN WITH MINERALS) TABS Take 1 tablet by mouth daily.    . ONE TOUCH LANCETS MISC Test blood sugars 3 times daily. Dx E13.9 90 each 3   No current facility-administered medications on file prior to visit.     BP 130/80 (BP Location: Left Arm)   Temp 98.2 F (36.8 C) (Oral)   Wt 181 lb (82.1 kg)   LMP 05/17/2016   BMI 29.66 kg/m       Objective:   Physical Exam  Constitutional: She is oriented to person, place, and time. She appears well-developed and well-nourished. No distress.  Neck: Thyromegaly present.  Cardiovascular: Normal rate, regular rhythm, normal heart sounds and  intact distal pulses. Exam reveals no gallop and no friction rub.  No murmur heard. Pulmonary/Chest: Effort normal and breath sounds normal. No respiratory distress. She has no wheezes. She has no rales. She exhibits no tenderness.  Neurological: She is alert and oriented to person, place, and time.  Skin: Skin is warm and dry. No rash noted. She is not diaphoretic. No erythema. No pallor.  Psychiatric: She has a normal mood and affect. Her behavior is normal. Judgment and thought content normal.  Nursing note and vitals reviewed.     Assessment & Plan:  1. Diabetes 1.5, managed as type 2 ([place]) - POC HgB A1c- 8.4. Has increased from 8.2  - Will have her come off Weight Watchers and back to her low carb diet  - Follow up in 3 months  - Consider glipzide at that point.   Dorothyann Peng, NP

## 2017-10-01 ENCOUNTER — Encounter: Payer: Self-pay | Admitting: Adult Health

## 2017-10-01 LAB — HM DIABETES EYE EXAM

## 2017-10-02 ENCOUNTER — Other Ambulatory Visit: Payer: Self-pay | Admitting: Adult Health

## 2017-10-02 ENCOUNTER — Encounter: Payer: Self-pay | Admitting: Adult Health

## 2017-10-02 ENCOUNTER — Ambulatory Visit (INDEPENDENT_AMBULATORY_CARE_PROVIDER_SITE_OTHER): Payer: Managed Care, Other (non HMO) | Admitting: Adult Health

## 2017-10-02 VITALS — BP 118/80 | Temp 98.0°F | Ht 65.5 in | Wt 184.0 lb

## 2017-10-02 DIAGNOSIS — E139 Other specified diabetes mellitus without complications: Secondary | ICD-10-CM | POA: Diagnosis not present

## 2017-10-02 DIAGNOSIS — I10 Essential (primary) hypertension: Secondary | ICD-10-CM | POA: Diagnosis not present

## 2017-10-02 DIAGNOSIS — Z0000 Encounter for general adult medical examination without abnormal findings: Secondary | ICD-10-CM

## 2017-10-02 DIAGNOSIS — Z1159 Encounter for screening for other viral diseases: Secondary | ICD-10-CM | POA: Diagnosis not present

## 2017-10-02 DIAGNOSIS — E039 Hypothyroidism, unspecified: Secondary | ICD-10-CM

## 2017-10-02 DIAGNOSIS — Z114 Encounter for screening for human immunodeficiency virus [HIV]: Secondary | ICD-10-CM

## 2017-10-02 LAB — CBC WITH DIFFERENTIAL/PLATELET
BASOS ABS: 0 10*3/uL (ref 0.0–0.1)
BASOS PCT: 0.9 % (ref 0.0–3.0)
Eosinophils Absolute: 0.1 10*3/uL (ref 0.0–0.7)
Eosinophils Relative: 1.2 % (ref 0.0–5.0)
HEMATOCRIT: 40.1 % (ref 36.0–46.0)
HEMOGLOBIN: 13 g/dL (ref 12.0–15.0)
LYMPHS PCT: 45.2 % (ref 12.0–46.0)
Lymphs Abs: 2.2 10*3/uL (ref 0.7–4.0)
MCHC: 32.4 g/dL (ref 30.0–36.0)
MCV: 72.6 fl — ABNORMAL LOW (ref 78.0–100.0)
MONO ABS: 0.3 10*3/uL (ref 0.1–1.0)
Monocytes Relative: 5.7 % (ref 3.0–12.0)
NEUTROS ABS: 2.3 10*3/uL (ref 1.4–7.7)
Neutrophils Relative %: 47 % (ref 43.0–77.0)
PLATELETS: 230 10*3/uL (ref 150.0–400.0)
RBC: 5.53 Mil/uL — ABNORMAL HIGH (ref 3.87–5.11)
RDW: 15 % (ref 11.5–15.5)
WBC: 4.9 10*3/uL (ref 4.0–10.5)

## 2017-10-02 LAB — HEPATIC FUNCTION PANEL
ALK PHOS: 107 U/L (ref 39–117)
ALT: 14 U/L (ref 0–35)
AST: 13 U/L (ref 0–37)
Albumin: 4.4 g/dL (ref 3.5–5.2)
BILIRUBIN TOTAL: 0.5 mg/dL (ref 0.2–1.2)
Bilirubin, Direct: 0.1 mg/dL (ref 0.0–0.3)
Total Protein: 6.9 g/dL (ref 6.0–8.3)

## 2017-10-02 LAB — BASIC METABOLIC PANEL
BUN: 14 mg/dL (ref 6–23)
CALCIUM: 9.7 mg/dL (ref 8.4–10.5)
CHLORIDE: 104 meq/L (ref 96–112)
CO2: 29 mEq/L (ref 19–32)
Creatinine, Ser: 0.96 mg/dL (ref 0.40–1.20)
GFR: 77.18 mL/min (ref >60.00)
Glucose, Bld: 155 mg/dL — ABNORMAL HIGH (ref 70–99)
Potassium: 5 mEq/L (ref 3.5–5.1)
Sodium: 142 mEq/L (ref 135–145)

## 2017-10-02 LAB — LIPID PANEL
Cholesterol: 152 mg/dL (ref 0–200)
HDL: 36 mg/dL — AB (ref >39.00)
LDL CALC: 89 mg/dL (ref 0–99)
NONHDL: 116.05
TRIGLYCERIDES: 135 mg/dL (ref 0.0–149.0)
Total CHOL/HDL Ratio: 4
VLDL: 27 mg/dL (ref 0.0–40.0)

## 2017-10-02 LAB — HEMOGLOBIN A1C: Hgb A1c MFr Bld: 8.6 % — ABNORMAL HIGH (ref 4.6–6.5)

## 2017-10-02 LAB — TSH: TSH: 1.47 u[IU]/mL (ref 0.35–4.50)

## 2017-10-02 NOTE — Progress Notes (Signed)
Subjective:    Patient ID: Autumn Acevedo, female    DOB: May 31, 1960, 57 y.o.   MRN: [id number]  HPI  Patient presents for yearly preventative medicine examination. She is a pleasant 57 year old female who  has a past medical history of Allergy, Diabetes mellitus without complication ([place]), Goiter, Hypertension, Hypothyroid, and ITP (idiopathic thrombocytopenic purpura).  Diabetes mellitus-currently prescribed metformin 1000 mg daily.  She has been working on lifestyle modifications.  During her last visit she reported that she had been going to [employer] every morning. She has not been to the gym in 3 weeks.  She does monitor her blood sugars at home and reports readings 150 - 256.  Lab Results  Component Value Date   HGBA1C 8.4 07/10/2017   Hypothyroidism-controlled with Synthroid 88 mcg daily. + goiter.   Hypertension -controlled with lisinopril 15 mg BP Readings from Last 3 Encounters:  10/02/17 118/80  07/10/17 130/80  04/11/17 (!) 150/96   All immunizations and health maintenance protocols were reviewed with the patient and needed orders were placed.  Appropriate screening laboratory values were ordered for the patient including screening of hyperlipidemia, renal function and hepatic function.  Medication reconciliation,  past medical history, social history, problem list and allergies were reviewed in detail with the patient  Goals were established with regard to weight loss, exercise, and  diet in compliance with medications  Wt Readings from Last 3 Encounters:  10/02/17 184 lb (83.5 kg)  07/10/17 181 lb (82.1 kg)  04/11/17 187 lb (84.8 kg)   End of life planning was discussed.  She is up-to-date on routine health maintenance items such as Pap smear, mammogram, colonoscopy, routine dental and vision screens. She denies any changes in her vision.   Review of Systems  Constitutional: Negative.   HENT: Negative.   Eyes: Negative.   Respiratory: Negative.     Cardiovascular: Negative.   Gastrointestinal: Negative.   Endocrine: Negative.   Genitourinary: Negative.   Musculoskeletal: Negative.   Skin: Negative.   Allergic/Immunologic: Negative.   Neurological: Negative.   Hematological: Negative.   Psychiatric/Behavioral: Negative.    Past Medical History:  Diagnosis Date  . Allergy   . Diabetes mellitus without complication ([place])    Type2   . Goiter   . Hypertension   . Hypothyroid   . ITP (idiopathic thrombocytopenic purpura)     Social History   Socioeconomic History  . Marital status: Single    Spouse name: Not on file  . Number of children: Not on file  . Years of education: Not on file  . Highest education level: Not on file  Occupational History  . Not on file  Social Needs  . Financial resource strain: Not on file  . Food insecurity:    Worry: Not on file    Inability: Not on file  . Transportation needs:    Medical: Not on file    Non-medical: Not on file  Tobacco Use  . Smoking status: Never Smoker  . Smokeless tobacco: Never Used  . Tobacco comment: never used tobacco  Substance and Sexual Activity  . Alcohol use: Yes    Alcohol/week: 0.6 oz    Types: 1 Standard drinks or equivalent per week    Comment: 2-3 drinks a week   . Drug use: No  . Sexual activity: Not on file  Lifestyle  . Physical activity:    Days per week: Not on file    Minutes per session: Not on  file  . Stress: Not on file  Relationships  . Social connections:    Talks on phone: Not on file    Gets together: Not on file    Attends religious service: Not on file    Active member of club or organization: Not on file    Attends meetings of clubs or organizations: Not on file    Relationship status: Not on file  . Intimate partner violence:    Fear of current or ex partner: Not on file    Emotionally abused: Not on file    Physically abused: Not on file    Forced sexual activity: Not on file  Other Topics Concern  . Not on file   Social History Narrative   Works for an IT consultant    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 Has two grand babies   She enjoys reading and going to the gym. Hanging out with friends.        Past Surgical History:  Procedure Laterality Date  . no surgeries at this time      Family History  Problem Relation Age of Onset  . Hypertension Mother        fhx  . COPD Father   . Lung cancer Father   . Pancreatic cancer Brother   . Colon cancer Sister     No Known Allergies  Current Outpatient Medications on File Prior to Visit  Medication Sig Dispense Refill  . albuterol (PROVENTIL HFA;VENTOLIN HFA) 108 (90 Base) MCG/ACT inhaler Inhale 2 puffs into the lungs as needed for wheezing. 2 Inhaler 3  . glucose blood test strip Test blood sugars 3 times daily. Dx E13.9 100 each 12  . levothyroxine (SYNTHROID, LEVOTHROID) 88 MCG tablet Take 1 tablet (88 mcg total) by mouth daily. 90 tablet 3  . lisinopril (PRINIVIL,ZESTRIL) 10 MG tablet TAKE 1 TABLET DAILY 90 tablet 1  . lisinopril (PRINIVIL,ZESTRIL) 5 MG tablet TAKE 1 TABLET DAILY 90 tablet 3  . metFORMIN (GLUCOPHAGE) 1000 MG tablet TAKE 1 TABLET TWICE A DAY 180 tablet 1  . Multiple Vitamin (MULTIVITAMIN WITH MINERALS) TABS Take 1 tablet by mouth daily.    . ONE TOUCH LANCETS MISC Test blood sugars 3 times daily. Dx E13.9 90 each 3   No current facility-administered medications on file prior to visit.     BP 118/80 (BP Location: Left Arm)   Temp 98 F (36.7 C) (Oral)   Ht 5' 5.5" (1.664 m)   Wt 184 lb (83.5 kg)   LMP 05/17/2016   BMI 30.15 kg/m       Objective:   Physical Exam  Constitutional: She is oriented to person, place, and time. She appears well-developed and well-nourished. No distress.  HENT:  Head: Normocephalic and atraumatic.  Right Ear: External ear normal.  Left Ear: External ear normal.  Nose: Nose normal.  Mouth/Throat: Oropharynx is clear and moist. No oropharyngeal  exudate.  Eyes: Pupils are equal, round, and reactive to light. Conjunctivae and EOM are normal. Right eye exhibits no discharge. Left eye exhibits no discharge. No scleral icterus.  Neck: Trachea normal, normal range of motion, full passive range of motion without pain and phonation normal. Neck supple. Normal carotid pulses, no hepatojugular reflux and no JVD present. Carotid bruit is not present. No tracheal deviation present. Thyroid mass present. No thyromegaly present.  Cardiovascular: Normal rate, regular rhythm, normal heart sounds and intact distal pulses. Exam reveals  no gallop and no friction rub.  No murmur heard. Pulmonary/Chest: Effort normal and breath sounds normal. No stridor. No respiratory distress. She has no wheezes. She has no rales. She exhibits no tenderness.  Abdominal: Soft. Normal appearance and bowel sounds are normal. She exhibits no distension and no mass. There is no tenderness. There is no rebound and no guarding.  Musculoskeletal: Normal range of motion. She exhibits no edema or tenderness.  Lymphadenopathy:    She has no cervical adenopathy.  Neurological: She is alert and oriented to person, place, and time. She displays normal reflexes. No cranial nerve deficit. She exhibits normal muscle tone. Coordination normal.  Skin: Skin is warm and dry. Capillary refill takes less than 2 seconds. No rash noted. She is not diaphoretic. No erythema. No pallor.  Psychiatric: She has a normal mood and affect. Her behavior is normal. Judgment and thought content normal.  Nursing note and vitals reviewed.      Assessment & Plan:  1. Routine general medical examination at a health care facility - Needs to continue with life style modifications  - Follow up in one year or sooner if needed - Basic metabolic panel - CBC with Differential/Platelet - Hepatic function panel - Lipid panel - TSH  2. Diabetes 1.5, managed as type 2 ([place]) - Not controlled. Likely add Glipizide 5  mg XR - Follow up in 3 months - Basic metabolic panel - CBC with Differential/Platelet - Hepatic function panel - Lipid panel - TSH  3. Essential hypertension - No change. Well controlled.  - Basic metabolic panel - CBC with Differential/Platelet - Hepatic function panel - Lipid panel - TSH  4. Acquired hypothyroidism - Consider increase in Synthroid  - Basic metabolic panel - CBC with Differential/Platelet - Hepatic function panel - Lipid panel - TSH  5. Need for hepatitis C screening test  - Hep C Antibody  6. Encounter for screening for HIV  - HIV antibody  Dorothyann Peng, NP

## 2017-10-02 NOTE — Addendum Note (Signed)
Addended by: Tomi Likens on: 10/02/2017 12:15 PM   Modules accepted: Orders

## 2017-10-03 ENCOUNTER — Telehealth: Payer: Self-pay | Admitting: Adult Health

## 2017-10-03 LAB — HIV ANTIBODY (ROUTINE TESTING W REFLEX): HIV 1&2 Ab, 4th Generation: NONREACTIVE

## 2017-10-03 LAB — HEPATITIS C ANTIBODY
HEP C AB: NONREACTIVE
SIGNAL TO CUT-OFF: 0.02 (ref <1.00)

## 2017-10-03 NOTE — Telephone Encounter (Signed)
Patient calling back to receive lab results. Please advise.    Copied from [place] [id number]. Topic: Quick Communication - Lab Results >> Oct 03, 2017  4:11 PM [name], [place] wrote: Called patient to inform them of all lab results from 10/02/17 . When patient returns call, triage nurse may disclose results.

## 2017-10-04 ENCOUNTER — Telehealth: Payer: Self-pay | Admitting: Adult Health

## 2017-10-04 ENCOUNTER — Other Ambulatory Visit: Payer: Self-pay | Admitting: Family Medicine

## 2017-10-04 DIAGNOSIS — E039 Hypothyroidism, unspecified: Secondary | ICD-10-CM

## 2017-10-04 MED ORDER — GLIPIZIDE 5 MG PO TABS: 5.0000 mg | ORAL_TABLET | Freq: Two times a day (BID) | ORAL | 0 refills | Status: DC

## 2017-10-04 MED ORDER — LEVOTHYROXINE SODIUM 88 MCG PO TABS: 88.0000 ug | ORAL_TABLET | Freq: Every day | ORAL | 3 refills | Status: DC

## 2017-10-04 NOTE — Telephone Encounter (Signed)
Sent to the pharmacy by e-scribe. 

## 2017-10-04 NOTE — Addendum Note (Signed)
Addended by: [name] on: 10/04/2017 10:17 AM   Modules accepted: Orders

## 2017-10-04 NOTE — Telephone Encounter (Deleted)
Request refill for levothyroxine 88 mcg tab, prescription expired on 09/28/17. LR 09/28/16 for #90 tabs and 3 refills  Dorothyann Peng, NP LOV  10/02/17 NOV  01/02/18  [id number], [address]  She has 2 tabs left.

## 2017-10-04 NOTE — Telephone Encounter (Signed)
Request refill for levothyroxine 88 mcg tab, prescription expired on 09/28/17. LR 09/28/16 for #90 tabs and 3 refills  Dorothyann Peng, NP LOV  10/02/17 NOV  01/02/18  [id number], [address]  She has 2 tabs left.

## 2017-10-09 ENCOUNTER — Telehealth: Payer: Self-pay | Admitting: Adult Health

## 2017-10-09 NOTE — Telephone Encounter (Signed)
Copied from [place] [id number]. Topic: Quick Communication - Rx Refill/Question >> Oct 09, 2017  2:47 PM Judyann Munson wrote: Medication: glipiZIDE (GLUCOTROL) 5 MG tablet  Pt has a few concerns and questions in reagrds to her medication

## 2017-10-10 NOTE — Telephone Encounter (Signed)
Pt called back to get medication clarification.  Discussed instructions of Glipizide and that it is being added as an addition to her Metformin.  Reviewed last OV notes as well to ensure there were no discrepancies in instructions.  Pt expressed understanding and did not have any further questions.   Nothing further needed.

## 2017-10-10 NOTE — Telephone Encounter (Signed)
Left a message for a return call.

## 2017-11-06 ENCOUNTER — Telehealth: Payer: Self-pay

## 2017-11-06 ENCOUNTER — Telehealth: Payer: Self-pay | Admitting: Adult Health

## 2017-11-06 DIAGNOSIS — Z760 Encounter for issue of repeat prescription: Secondary | ICD-10-CM

## 2017-11-06 DIAGNOSIS — I10 Essential (primary) hypertension: Secondary | ICD-10-CM

## 2017-11-06 DIAGNOSIS — E139 Other specified diabetes mellitus without complications: Secondary | ICD-10-CM

## 2017-11-06 MED ORDER — LISINOPRIL 5 MG PO TABS
5.0000 mg | ORAL_TABLET | Freq: Every day | ORAL | 3 refills | Status: DC
Start: 2017-11-06 — End: 2018-10-17

## 2017-11-06 MED ORDER — LISINOPRIL 10 MG PO TABS: 10.0000 mg | ORAL_TABLET | Freq: Every day | ORAL | 3 refills | Status: DC

## 2017-11-06 MED ORDER — METFORMIN HCL 1000 MG PO TABS: 1000.0000 mg | ORAL_TABLET | Freq: Two times a day (BID) | ORAL | 0 refills | Status: DC

## 2017-11-06 NOTE — Telephone Encounter (Signed)
Copied from [place] [phone]. Topic: Inquiry >> Nov 06, 2017 11:31 AM Ahmed Prima L wrote: Reason for CRM:  Patient said when she was in the office on June 11th she left a form from her insurance with [employer] to be filled out by [employer]. She said that she was suppose to receive a copy of it via mail and has not received it. She would like that to be faxed to her [phone]

## 2017-11-06 NOTE — Telephone Encounter (Signed)
Sent to the pharmacy by e-scribe. 

## 2017-11-06 NOTE — Telephone Encounter (Signed)
Sent by mail

## 2017-11-06 NOTE — Telephone Encounter (Signed)
Please advise 

## 2017-11-06 NOTE — Telephone Encounter (Signed)
Copied from [place] [phone]. Topic: Quick Communication - See Telephone Encounter >> Nov 06, 2017 11:33 AM Ahmed Prima L wrote: CRM for notification. See Telephone encounter for: 11/06/17.  When patient was in the office on 6/11, Tommi Rumps was going to send her prescriptions in, she states the pharmacy did not receive those.   lisinopril (PRINIVIL,ZESTRIL) 10 MG tablet  lisinopril (PRINIVIL,ZESTRIL) 5 MG tablet metFORMIN (GLUCOPHAGE) 1000 MG tablet  [facility] [place] [place] [facility] Portal to Registered [employer] Sites

## 2017-11-12 ENCOUNTER — Telehealth: Payer: Self-pay | Admitting: Adult Health

## 2017-11-12 NOTE — Telephone Encounter (Unsigned)
Copied from [place] [id number]. Topic: Quick Communication - Rx Refill/Question >> Nov 12, 2017  5:13 PM Percell Belt A wrote: Medication: glipiZIDE (GLUCOTROL) 5 MG tablet [[id number]]  Has the patient contacted their pharmacy? No  (Agent: If no, request that the patient contact the pharmacy for the refill.) (Agent: If yes, when and what did the pharmacy advise?)  Preferred Pharmacy (with phone number or street name): [facility] on [address], normally its a 90 day supply ??    Agent: Please be advised that RX refills may take up to 3 business days. We ask that you follow-up with your pharmacy.

## 2017-11-14 ENCOUNTER — Other Ambulatory Visit: Payer: Self-pay | Admitting: *Deleted

## 2017-11-14 MED ORDER — GLIPIZIDE 5 MG PO TABS: 5.0000 mg | ORAL_TABLET | Freq: Two times a day (BID) | ORAL | 0 refills | Status: DC

## 2017-11-14 NOTE — Telephone Encounter (Signed)
Rx for glipizide #120 sent to pharmacy- patient has appointment 01/02/18 to follow up on medication increase. Patient is taking 2 pills daily so #90 sent in was incorrect #.

## 2018-01-02 ENCOUNTER — Encounter: Payer: Self-pay | Admitting: Adult Health

## 2018-01-02 ENCOUNTER — Ambulatory Visit (INDEPENDENT_AMBULATORY_CARE_PROVIDER_SITE_OTHER): Payer: Managed Care, Other (non HMO) | Admitting: Adult Health

## 2018-01-02 VITALS — BP 130/84 | Temp 98.5°F | Wt 186.0 lb

## 2018-01-02 DIAGNOSIS — E139 Other specified diabetes mellitus without complications: Secondary | ICD-10-CM | POA: Diagnosis not present

## 2018-01-02 LAB — POCT GLYCOSYLATED HEMOGLOBIN (HGB A1C): HbA1c, POC (controlled diabetic range): 7 % (ref 0.0–7.0)

## 2018-01-02 NOTE — Progress Notes (Signed)
Subjective:    Patient ID: Autumn Acevedo, female    DOB: Jan 23, 1961, 57 y.o.   MRN: [id number]  HPI 57 year old female who  has a past medical history of Allergy, Diabetes mellitus without complication ([place]), Goiter, Hypertension, Hypothyroid, and ITP (idiopathic thrombocytopenic purpura).   She presents to the office today for follow-up regarding diabetes.  During her last visit in June 2019 her A1c had gone up to 8.6.  Glipizide 5 mg BID was added to her regimen of metformin 1000 mg twice daily.   She has not been monitoring her blood sugar at home as she reports that her meter broke. She has been working on diet, has stopped eating fast food and baking. She has not been working out. She denies any signs of hypoglycemia   Wt Readings from Last 3 Encounters:  01/02/18 186 lb (84.4 kg)  10/02/17 184 lb (83.5 kg)  07/10/17 181 lb (82.1 kg)     Review of Systems See HPI   Past Medical History:  Diagnosis Date  . Allergy   . Diabetes mellitus without complication ([place])    Type2   . Goiter   . Hypertension   . Hypothyroid   . ITP (idiopathic thrombocytopenic purpura)     Social History   Socioeconomic History  . Marital status: Single    Spouse name: Not on file  . Number of children: Not on file  . Years of education: Not on file  . Highest education level: Not on file  Occupational History  . Not on file  Social Needs  . Financial resource strain: Not on file  . Food insecurity:    Worry: Not on file    Inability: Not on file  . Transportation needs:    Medical: Not on file    Non-medical: Not on file  Tobacco Use  . Smoking status: Never Smoker  . Smokeless tobacco: Never Used  . Tobacco comment: never used tobacco  Substance and Sexual Activity  . Alcohol use: Yes    Alcohol/week: 1.0 standard drinks    Types: 1 Standard drinks or equivalent per week    Comment: 2-3 drinks a week   . Drug use: No  . Sexual activity: Not on file  Lifestyle  . Physical  activity:    Days per week: Not on file    Minutes per session: Not on file  . Stress: Not on file  Relationships  . Social connections:    Talks on phone: Not on file    Gets together: Not on file    Attends religious service: Not on file    Active member of club or organization: Not on file    Attends meetings of clubs or organizations: Not on file    Relationship status: Not on file  . Intimate partner violence:    Fear of current or ex partner: Not on file    Emotionally abused: Not on file    Physically abused: Not on file    Forced sexual activity: Not on file  Other Topics Concern  . 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 Has two grand babies   She enjoys reading and going to the gym. Hanging out with friends.        Past Surgical History:  Procedure Laterality Date  . no surgeries at  this time      Family History  Problem Relation Age of Onset  . Hypertension Mother        fhx  . COPD Father   . Lung cancer Father   . Pancreatic cancer Brother   . Colon cancer Sister     No Known Allergies  Current Outpatient Medications on File Prior to Visit  Medication Sig Dispense Refill  . albuterol (PROVENTIL HFA;VENTOLIN HFA) 108 (90 Base) MCG/ACT inhaler Inhale 2 puffs into the lungs as needed for wheezing. 2 Inhaler 3  . glipiZIDE (GLUCOTROL) 5 MG tablet Take 1 tablet (5 mg total) by mouth 2 (two) times daily before a meal. 120 tablet 0  . glucose blood test strip Test blood sugars 3 times daily. Dx E13.9 100 each 12  . levothyroxine (SYNTHROID, LEVOTHROID) 88 MCG tablet Take 1 tablet (88 mcg total) by mouth daily. 90 tablet 3  . lisinopril (PRINIVIL,ZESTRIL) 10 MG tablet Take 1 tablet (10 mg total) by mouth daily. 90 tablet 3  . lisinopril (PRINIVIL,ZESTRIL) 5 MG tablet Take 1 tablet (5 mg total) by mouth daily. 90 tablet 3  . metFORMIN (GLUCOPHAGE) 1000 MG tablet  Take 1 tablet (1,000 mg total) by mouth 2 (two) times daily. 180 tablet 0  . Multiple Vitamin (MULTIVITAMIN WITH MINERALS) TABS Take 1 tablet by mouth daily.    . ONE TOUCH LANCETS MISC Test blood sugars 3 times daily. Dx E13.9 90 each 3   No current facility-administered medications on file prior to visit.     BP 130/84   Temp 98.5 F (36.9 C) (Oral)   Wt 186 lb (84.4 kg)   LMP 05/17/2016   BMI 30.48 kg/m       Objective:   Physical Exam  Constitutional: She is oriented to person, place, and time. She appears well-developed and well-nourished. No distress.  Eyes: Pupils are equal, round, and reactive to light. Conjunctivae and EOM are normal.  Cardiovascular: Normal rate and regular rhythm.  Pulmonary/Chest: Effort normal and breath sounds normal.  Musculoskeletal: Normal range of motion. She exhibits no edema, tenderness or deformity.  Neurological: She is alert and oriented to person, place, and time.  Skin: Skin is warm and dry. She is not diaphoretic.  Psychiatric: She has a normal mood and affect. Her behavior is normal. Judgment and thought content normal.  Nursing note and vitals reviewed.     Assessment & Plan:  1. Diabetes 1.5, managed as type 2 ([place])  - POCT A1C - 7.0 - A1c has improved - Encouraged to exercise at least 150 min a week - Follow up in 3 months  - she will send a mychart message and let me know what meter she has so we can send in a new one.   Dorothyann Peng, NP

## 2018-01-03 ENCOUNTER — Encounter: Payer: Self-pay | Admitting: Adult Health

## 2018-01-03 DIAGNOSIS — E139 Other specified diabetes mellitus without complications: Secondary | ICD-10-CM

## 2018-01-03 MED ORDER — ONETOUCH DELICA LANCETS FINE MISC: 3 refills | Status: DC

## 2018-01-03 MED ORDER — GLUCOSE BLOOD VI STRP: ORAL_STRIP | 3 refills | Status: DC

## 2018-01-03 MED ORDER — ONETOUCH VERIO W/DEVICE KIT: PACK | 0 refills | Status: DC

## 2018-01-17 ENCOUNTER — Other Ambulatory Visit: Payer: Self-pay | Admitting: Adult Health

## 2018-01-17 DIAGNOSIS — Z1231 Encounter for screening mammogram for malignant neoplasm of breast: Secondary | ICD-10-CM

## 2018-01-31 ENCOUNTER — Encounter: Payer: Self-pay | Admitting: Adult Health

## 2018-01-31 ENCOUNTER — Ambulatory Visit (INDEPENDENT_AMBULATORY_CARE_PROVIDER_SITE_OTHER): Payer: Managed Care, Other (non HMO) | Admitting: Adult Health

## 2018-01-31 VITALS — BP 112/78 | HR 92 | Temp 98.2°F | Wt 187.0 lb

## 2018-01-31 DIAGNOSIS — Z760 Encounter for issue of repeat prescription: Secondary | ICD-10-CM

## 2018-01-31 DIAGNOSIS — J40 Bronchitis, not specified as acute or chronic: Secondary | ICD-10-CM

## 2018-01-31 MED ORDER — HYDROCODONE-HOMATROPINE 5-1.5 MG/5ML PO SYRP: 5.0000 mL | ORAL_SOLUTION | Freq: Three times a day (TID) | ORAL | 0 refills | Status: DC | PRN

## 2018-01-31 MED ORDER — PREDNISONE 10 MG PO TABS: ORAL_TABLET | ORAL | 0 refills | Status: DC

## 2018-01-31 MED ORDER — ALBUTEROL SULFATE HFA 108 (90 BASE) MCG/ACT IN AERS: 2.0000 | INHALATION_SPRAY | RESPIRATORY_TRACT | 3 refills | Status: DC | PRN

## 2018-01-31 MED ORDER — BENZONATATE 200 MG PO CAPS: 200.0000 mg | ORAL_CAPSULE | Freq: Two times a day (BID) | ORAL | 0 refills | Status: DC | PRN

## 2018-01-31 NOTE — Progress Notes (Signed)
Subjective:    Patient ID: Autumn Acevedo, female    DOB: June 11, 1960, 57 y.o.   MRN: [id number]  Cough  This is a new problem. The current episode started 1 to 4 weeks ago (1.5 ). The problem occurs constantly. The cough is non-productive. Associated symptoms include a fever (resolved after 2 days ), postnasal drip and wheezing. Pertinent negatives include no chills, nasal congestion, rhinorrhea, sore throat or shortness of breath. The symptoms are aggravated by lying down. Treatments tried: claritin and Mucinex. The treatment provided no relief. Her past medical history is significant for environmental allergies. There is no history of asthma or bronchitis.     Review of Systems  Constitutional: Positive for activity change and fever (resolved after 2 days ). Negative for appetite change, chills and fatigue.  HENT: Positive for postnasal drip. Negative for rhinorrhea, sinus pressure, sinus pain, sore throat and tinnitus.   Respiratory: Positive for cough, chest tightness and wheezing. Negative for shortness of breath.   Allergic/Immunologic: Positive for environmental allergies.   Past Medical History:  Diagnosis Date  . Allergy   . Diabetes mellitus without complication ([place])    Type2   . Goiter   . Hypertension   . Hypothyroid   . ITP (idiopathic thrombocytopenic purpura)     Social History   Socioeconomic History  . Marital status: Single    Spouse name: Not on file  . Number of children: Not on file  . Years of education: Not on file  . Highest education level: Not on file  Occupational History  . Not on file  Social Needs  . Financial resource strain: Not on file  . Food insecurity:    Worry: Not on file    Inability: Not on file  . Transportation needs:    Medical: Not on file    Non-medical: Not on file  Tobacco Use  . Smoking status: Never Smoker  . Smokeless tobacco: Never Used  . Tobacco comment: never used tobacco  Substance and Sexual Activity  .  Alcohol use: Yes    Alcohol/week: 1.0 standard drinks    Types: 1 Standard drinks or equivalent per week    Comment: 2-3 drinks a week   . Drug use: No  . Sexual activity: Not on file  Lifestyle  . Physical activity:    Days per week: Not on file    Minutes per session: Not on file  . Stress: Not on file  Relationships  . Social connections:    Talks on phone: Not on file    Gets together: Not on file    Attends religious service: Not on file    Active member of club or organization: Not on file    Attends meetings of clubs or organizations: Not on file    Relationship status: Not on file  . Intimate partner violence:    Fear of current or ex partner: Not on file    Emotionally abused: Not on file    Physically abused: Not on file    Forced sexual activity: Not on file  Other Topics Concern  . 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 Has two grand babies   She enjoys reading and going to the gym. Hanging out with friends.        Past Surgical History:  Procedure Laterality Date  .  no surgeries at this time      Family History  Problem Relation Age of Onset  . Hypertension Mother        fhx  . COPD Father   . Lung cancer Father   . Pancreatic cancer Brother   . Colon cancer Sister     No Known Allergies  Current Outpatient Medications on File Prior to Visit  Medication Sig Dispense Refill  . Blood Glucose Monitoring Suppl (ONETOUCH VERIO) w/Device KIT USE TO TEST BLOOD GLUCOSE TWICE DAILY 1 kit 0  . glipiZIDE (GLUCOTROL) 5 MG tablet Take 1 tablet (5 mg total) by mouth 2 (two) times daily before a meal. 120 tablet 0  . glucose blood test strip Test blood sugars 3 times daily. Dx E13.9 100 each 12  . levothyroxine (SYNTHROID, LEVOTHROID) 88 MCG tablet Take 1 tablet (88 mcg total) by mouth daily. 90 tablet 3  . lisinopril (PRINIVIL,ZESTRIL) 10 MG tablet Take 1 tablet  (10 mg total) by mouth daily. 90 tablet 3  . lisinopril (PRINIVIL,ZESTRIL) 5 MG tablet Take 1 tablet (5 mg total) by mouth daily. 90 tablet 3  . metFORMIN (GLUCOPHAGE) 1000 MG tablet Take 1 tablet (1,000 mg total) by mouth 2 (two) times daily. 180 tablet 0  . Multiple Vitamin (MULTIVITAMIN WITH MINERALS) TABS Take 1 tablet by mouth daily.    Glory Rosebush DELICA LANCETS FINE MISC USE TO TEST BLOOD GLUCOSE TWICE DAILY 200 each 3   No current facility-administered medications on file prior to visit.     BP 112/78 (BP Location: Left Arm, Patient Position: Sitting, Cuff Size: Normal)   Pulse 92   Temp 98.2 F (36.8 C) (Oral)   Wt 187 lb (84.8 kg)   LMP 05/17/2016   SpO2 100%   BMI 30.65 kg/m       Objective:   Physical Exam  Constitutional: She is oriented to person, place, and time. She appears well-developed and well-nourished. No distress.  HENT:  Head: Normocephalic and atraumatic.  Right Ear: External ear normal.  Left Ear: External ear normal.  Nose: Nose normal.  Mouth/Throat: Oropharynx is clear and moist. No oropharyngeal exudate.  + clear PND    Cardiovascular: Normal rate, regular rhythm, normal heart sounds and intact distal pulses.  Pulmonary/Chest: Effort normal. No respiratory distress. She has no decreased breath sounds. She has wheezes (expiratory ). She has no rhonchi. She has no rales.  Neurological: She is alert and oriented to person, place, and time.  Skin: She is not diaphoretic.  Psychiatric: She has a normal mood and affect. Her behavior is normal. Judgment and thought content normal.  Nursing note and vitals reviewed.     Assessment & Plan:  1. Bronchitis - Exam consistent with bronchitis. Will prescribe prednisone for symptoms. Was advised to increase water intake to keep her blood sugars low - Follow up if no improvement in the next 2-3 days or sooner if fever presents  - predniSONE (DELTASONE) 10 MG tablet; 40 mg x 3 days, 20 mg x 3 days, 10 mg x 3  days  Dispense: 21 tablet; Refill: 0 - HYDROcodone-homatropine (HYCODAN) 5-1.5 MG/5ML syrup; Take 5 mLs by mouth every 8 (eight) hours as needed for cough.  Dispense: 120 mL; Refill: 0 - benzonatate (TESSALON) 200 MG capsule; Take 1 capsule (200 mg total) by mouth 2 (two) times daily as needed for cough.  Dispense: 20 capsule; Refill: 0 - albuterol (PROVENTIL HFA;VENTOLIN HFA) 108 (90 Base) MCG/ACT inhaler; Inhale 2 puffs into  the lungs as needed for wheezing.  Dispense: 2 Inhaler; Refill: 3  2. Medication refill  - albuterol (PROVENTIL HFA;VENTOLIN HFA) 108 (90 Base) MCG/ACT inhaler; Inhale 2 puffs into the lungs as needed for wheezing.  Dispense: 2 Inhaler; Refill: 3   Dorothyann Peng, NP

## 2018-02-05 ENCOUNTER — Encounter: Payer: Self-pay | Admitting: Adult Health

## 2018-02-05 ENCOUNTER — Other Ambulatory Visit: Payer: Self-pay | Admitting: Adult Health

## 2018-02-05 MED ORDER — PREDNISONE 20 MG PO TABS: 20.0000 mg | ORAL_TABLET | Freq: Every day | ORAL | 0 refills | Status: DC

## 2018-02-05 MED ORDER — DOXYCYCLINE HYCLATE 100 MG PO CAPS: 100.0000 mg | ORAL_CAPSULE | Freq: Two times a day (BID) | ORAL | 0 refills | Status: DC

## 2018-02-15 ENCOUNTER — Ambulatory Visit
Admission: RE | Admit: 2018-02-15 | Discharge: 2018-02-15 | Disposition: A | Discharge status: Home / Self Care | Payer: Managed Care, Other (non HMO) | Source: Ambulatory Visit | Attending: Adult Health | Admitting: Adult Health

## 2018-02-15 DIAGNOSIS — Z1231 Encounter for screening mammogram for malignant neoplasm of breast: Secondary | ICD-10-CM

## 2018-04-01 ENCOUNTER — Other Ambulatory Visit: Payer: Self-pay | Admitting: Adult Health

## 2018-04-01 DIAGNOSIS — E139 Other specified diabetes mellitus without complications: Secondary | ICD-10-CM

## 2018-04-01 DIAGNOSIS — Z760 Encounter for issue of repeat prescription: Secondary | ICD-10-CM

## 2018-04-02 NOTE — Telephone Encounter (Signed)
Appt on 04/03/18

## 2018-04-03 ENCOUNTER — Encounter: Payer: Self-pay | Admitting: Adult Health

## 2018-04-03 ENCOUNTER — Ambulatory Visit (INDEPENDENT_AMBULATORY_CARE_PROVIDER_SITE_OTHER): Payer: Managed Care, Other (non HMO) | Admitting: Adult Health

## 2018-04-03 VITALS — BP 130/84 | Temp 98.3°F | Wt 187.0 lb

## 2018-04-03 DIAGNOSIS — E139 Other specified diabetes mellitus without complications: Secondary | ICD-10-CM

## 2018-04-03 LAB — POCT GLYCOSYLATED HEMOGLOBIN (HGB A1C): HBA1C, POC (CONTROLLED DIABETIC RANGE): 9.4 % — AB (ref 0.0–7.0)

## 2018-04-03 MED ORDER — GLIPIZIDE 5 MG PO TABS: 5.0000 mg | ORAL_TABLET | Freq: Two times a day (BID) | ORAL | 0 refills | Status: DC

## 2018-04-03 NOTE — Telephone Encounter (Signed)
Sent to the pharmacy by e-scribe for 3 months. 

## 2018-04-03 NOTE — Progress Notes (Signed)
Subjective:    Patient ID: Autumn Acevedo, female    DOB: [birth year], 57 y.o.   MRN: [id number]  HPI  57 year old female who  has a past medical history of Allergy, Diabetes mellitus without complication ([place]), Goiter, Hypertension, Hypothyroid, and ITP (idiopathic thrombocytopenic purpura). She presents to the office today for follow-up regarding diabetes.  She is currently maintained on metformin 1000 mg twice a day and glipizide 5 mg twice daily.  During her last visit her A1c was 7.0, this was down from 8.6-3 months prior.  She denies any signs of hypoglycemia.  Today in the office she He continues to be working on diet and is back in the gym working out 3 x a week. She is doing a mile on the treadmill and then does light weight training.   She had stopped glipizide as I guess there was some miscommunication   Wt Readings from Last 10 Encounters:  04/03/18 187 lb (84.8 kg)  01/31/18 187 lb (84.8 kg)  01/02/18 186 lb (84.4 kg)  10/02/17 184 lb (83.5 kg)  07/10/17 181 lb (82.1 kg)  04/11/17 187 lb (84.8 kg)  12/29/16 182 lb (82.6 kg)  09/28/16 178 lb (80.7 kg)  06/28/16 176 lb 12.8 oz (80.2 kg)  12/31/15 190 lb 9.6 oz (86.5 kg)  ]  Review of Systems See HPI   Past Medical History:  Diagnosis Date  . Allergy   . Diabetes mellitus without complication ([place])    Type2   . Goiter   . Hypertension   . Hypothyroid   . ITP (idiopathic thrombocytopenic purpura)     Social History   Socioeconomic History  . Marital status: Single    Spouse name: Not on file  . Number of children: Not on file  . Years of education: Not on file  . Highest education level: Not on file  Occupational History  . Not on file  Social Needs  . Financial resource strain: Not on file  . Food insecurity:    Worry: Not on file    Inability: Not on file  . Transportation needs:    Medical: Not on file    Non-medical: Not on file  Tobacco Use  . Smoking status: Never Smoker  . Smokeless  tobacco: Never Used  . Tobacco comment: never used tobacco  Substance and Sexual Activity  . Alcohol use: Yes    Alcohol/week: 1.0 standard drinks    Types: 1 Standard drinks or equivalent per week    Comment: 2-3 drinks a week   . Drug use: No  . Sexual activity: Not on file  Lifestyle  . Physical activity:    Days per week: Not on file    Minutes per session: Not on file  . Stress: Not on file  Relationships  . Social connections:    Talks on phone: Not on file    Gets together: Not on file    Attends religious service: Not on file    Active member of club or organization: Not on file    Attends meetings of clubs or organizations: Not on file    Relationship status: Not on file  . Intimate partner violence:    Fear of current or ex partner: Not on file    Emotionally abused: Not on file    Physically abused: Not on file    Forced sexual activity: Not on file  Other Topics Concern  . Not on file  Social History Narrative  Works for an [employer] consultant    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 Has two grand babies   She enjoys reading and going to the gym. Hanging out with friends.        Past Surgical History:  Procedure Laterality Date  . no surgeries at this time      Family History  Problem Relation Age of Onset  . Hypertension Mother        fhx  . COPD Father   . Lung cancer Father   . Pancreatic cancer Brother   . Colon cancer Sister     No Known Allergies  Current Outpatient Medications on File Prior to Visit  Medication Sig Dispense Refill  . albuterol (PROVENTIL HFA;VENTOLIN HFA) 108 (90 Base) MCG/ACT inhaler Inhale 2 puffs into the lungs as needed for wheezing. 2 Inhaler 3  . Blood Glucose Monitoring Suppl (ONETOUCH VERIO) w/Device KIT USE TO TEST BLOOD GLUCOSE TWICE DAILY 1 kit 0  . glucose blood test strip Test blood sugars 3 times daily. Dx E13.9 100 each 12  . levothyroxine (SYNTHROID, LEVOTHROID) 88 MCG tablet Take  1 tablet (88 mcg total) by mouth daily. 90 tablet 3  . lisinopril (PRINIVIL,ZESTRIL) 10 MG tablet Take 1 tablet (10 mg total) by mouth daily. 90 tablet 3  . lisinopril (PRINIVIL,ZESTRIL) 5 MG tablet Take 1 tablet (5 mg total) by mouth daily. 90 tablet 3  . metFORMIN (GLUCOPHAGE) 1000 MG tablet Take 1 tablet (1,000 mg total) by mouth 2 (two) times daily. 180 tablet 0  . Multiple Vitamin (MULTIVITAMIN WITH MINERALS) TABS Take 1 tablet by mouth daily.    Glory Rosebush DELICA LANCETS FINE MISC USE TO TEST BLOOD GLUCOSE TWICE DAILY 200 each 3   No current facility-administered medications on file prior to visit.     BP 130/84   Temp 98.3 F (36.8 C)   Wt 187 lb (84.8 kg)   LMP 05/17/2016   BMI 30.65 kg/m       Objective:   Physical Exam  Constitutional: She is oriented to person, place, and time. She appears well-developed and well-nourished. No distress.  Cardiovascular: Normal rate, regular rhythm, normal heart sounds and intact distal pulses.  Pulmonary/Chest: Effort normal and breath sounds normal.  Musculoskeletal: Normal range of motion. She exhibits no edema, tenderness or deformity.  Neurological: She is alert and oriented to person, place, and time.  Skin: Skin is warm and dry. Capillary refill takes less than 2 seconds. She is not diaphoretic.  Psychiatric: She has a normal mood and affect. Her behavior is normal. Judgment and thought content normal.  Nursing note and vitals reviewed.     Assessment & Plan:  1. Diabetes 1.5, managed as type 2 ([place]) - POC HgB A1c- 9.4  - Will add back Glipizide - Continue to make lifestyle modifications  - Follow up in 3 months  - glipiZIDE (GLUCOTROL) 5 MG tablet; Take 1 tablet (5 mg total) by mouth 2 (two) times daily before a meal.  Dispense: 180 tablet; Refill: 0  Dorothyann Peng, NP

## 2018-06-24 ENCOUNTER — Other Ambulatory Visit: Payer: Self-pay | Admitting: Adult Health

## 2018-06-24 DIAGNOSIS — E139 Other specified diabetes mellitus without complications: Secondary | ICD-10-CM

## 2018-06-25 NOTE — Telephone Encounter (Signed)
Pt has appt for 3.11.2020 °

## 2018-07-03 ENCOUNTER — Other Ambulatory Visit: Payer: Self-pay

## 2018-07-03 ENCOUNTER — Encounter: Payer: Self-pay | Admitting: Adult Health

## 2018-07-03 ENCOUNTER — Ambulatory Visit (INDEPENDENT_AMBULATORY_CARE_PROVIDER_SITE_OTHER): Payer: Managed Care, Other (non HMO) | Admitting: Adult Health

## 2018-07-03 VITALS — BP 150/90 | Temp 98.0°F | Wt 186.0 lb

## 2018-07-03 DIAGNOSIS — E139 Other specified diabetes mellitus without complications: Secondary | ICD-10-CM | POA: Diagnosis not present

## 2018-07-03 LAB — POCT GLYCOSYLATED HEMOGLOBIN (HGB A1C): HbA1c, POC (controlled diabetic range): 8 % — AB (ref 0.0–7.0)

## 2018-07-03 MED ORDER — GLIPIZIDE ER 10 MG PO TB24: 10.0000 mg | ORAL_TABLET | Freq: Every day | ORAL | 0 refills | Status: DC

## 2018-07-03 NOTE — Progress Notes (Signed)
Subjective:    Patient ID: Autumn Acevedo, female    DOB: August 15, 1960, 58 y.o.   MRN: [id number]  HPI 58 year old female who  has a past medical history of Allergy, Diabetes mellitus without complication ([place]), Goiter, Hypertension, Hypothyroid, and ITP (idiopathic thrombocytopenic purpura).  She presents to the office today for follow-up regarding diabetes.  Currently she is maintained on metformin 1000 mg twice a day and glipizide 5 mg twice a day.  During her last visit there was some miscommunication and she had stopped glipizide, her A1c increased from 7.0-9.4.  She was subsequently started back on glipizide 5 mg twice a day.  A in the office she reports that she has not had any signs of hypoglycemia.  She is continuing to work on diet and walking a mile 3 x a week.   Lab Results  Component Value Date   HGBA1C 9.4 (A) 04/03/2018   Wt Readings from Last 3 Encounters:  04/03/18 187 lb (84.8 kg)  01/31/18 187 lb (84.8 kg)  01/02/18 186 lb (84.4 kg)   Review of Systems See HPI   Past Medical History:  Diagnosis Date  . Allergy   . Diabetes mellitus without complication ([place])    Type2   . Goiter   . Hypertension   . Hypothyroid   . ITP (idiopathic thrombocytopenic purpura)     Social History   Socioeconomic History  . Marital status: Single    Spouse name: Not on file  . Number of children: Not on file  . Years of education: Not on file  . Highest education level: Not on file  Occupational History  . Not on file  Social Needs  . Financial resource strain: Not on file  . Food insecurity:    Worry: Not on file    Inability: Not on file  . Transportation needs:    Medical: Not on file    Non-medical: Not on file  Tobacco Use  . Smoking status: Never Smoker  . Smokeless tobacco: Never Used  . Tobacco comment: never used tobacco  Substance and Sexual Activity  . Alcohol use: Yes    Alcohol/week: 1.0 standard drinks    Types: 1 Standard drinks or equivalent per  week    Comment: 2-3 drinks a week   . Drug use: No  . Sexual activity: Not on file  Lifestyle  . Physical activity:    Days per week: Not on file    Minutes per session: Not on file  . Stress: Not on file  Relationships  . Social connections:    Talks on phone: Not on file    Gets together: Not on file    Attends religious service: Not on file    Active member of club or organization: Not on file    Attends meetings of clubs or organizations: Not on file    Relationship status: Not on file  . Intimate partner violence:    Fear of current or ex partner: Not on file    Emotionally abused: Not on file    Physically abused: Not on file    Forced sexual activity: Not on file  Other Topics Concern  . 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 Has two grand babies   She enjoys reading and going to the gym. Hanging out with friends.  Past Surgical History:  Procedure Laterality Date  . no surgeries at this time      Family History  Problem Relation Age of Onset  . Hypertension Mother        fhx  . COPD Father   . Lung cancer Father   . Pancreatic cancer Brother   . Colon cancer Sister     No Known Allergies  Current Outpatient Medications on File Prior to Visit  Medication Sig Dispense Refill  . albuterol (PROVENTIL HFA;VENTOLIN HFA) 108 (90 Base) MCG/ACT inhaler Inhale 2 puffs into the lungs as needed for wheezing. 2 Inhaler 3  . Blood Glucose Monitoring Suppl (ONETOUCH VERIO) w/Device KIT USE TO TEST BLOOD GLUCOSE TWICE DAILY 1 kit 0  . glipiZIDE (GLUCOTROL) 5 MG tablet TAKE 1 TABLET (5 MG TOTAL) BY MOUTH 2 (TWO) TIMES DAILY BEFORE A MEAL. 180 tablet 0  . glucose blood test strip Test blood sugars 3 times daily. Dx E13.9 100 each 12  . levothyroxine (SYNTHROID, LEVOTHROID) 88 MCG tablet Take 1 tablet (88 mcg total) by mouth daily. 90 tablet 3  . lisinopril  (PRINIVIL,ZESTRIL) 10 MG tablet Take 1 tablet (10 mg total) by mouth daily. 90 tablet 3  . lisinopril (PRINIVIL,ZESTRIL) 5 MG tablet Take 1 tablet (5 mg total) by mouth daily. 90 tablet 3  . metFORMIN (GLUCOPHAGE) 1000 MG tablet TAKE 1 TABLET TWICE A DAY 180 tablet 0  . Multiple Vitamin (MULTIVITAMIN WITH MINERALS) TABS Take 1 tablet by mouth daily.    Glory Rosebush DELICA LANCETS FINE MISC USE TO TEST BLOOD GLUCOSE TWICE DAILY 200 each 3   No current facility-administered medications on file prior to visit.     LMP 05/17/2016       Objective:   Physical Exam Vitals signs and nursing note reviewed.  Constitutional:      Appearance: She is obese.  Cardiovascular:     Rate and Rhythm: Normal rate and regular rhythm.     Pulses: Normal pulses.     Heart sounds: Normal heart sounds.  Pulmonary:     Effort: Pulmonary effort is normal.     Breath sounds: Normal breath sounds.  Abdominal:     General: Abdomen is flat. Bowel sounds are normal.     Palpations: Abdomen is soft.  Musculoskeletal: Normal range of motion.  Skin:    General: Skin is warm and dry.     Capillary Refill: Capillary refill takes less than 2 seconds.  Neurological:     General: No focal deficit present.     Mental Status: She is alert and oriented to person, place, and time.  Psychiatric:        Mood and Affect: Mood normal.        Behavior: Behavior normal.        Thought Content: Thought content normal.        Judgment: Judgment normal.       Assessment & Plan:  1. Diabetes 1.5, managed as type 2 ([place]) - POCT A1C- 8.0. - has improved  - Will increase Glipizide to 10 mg XR - Follow up in 3 months  - Encouraged life style modifications   Dorothyann Peng, AGNP

## 2018-09-23 ENCOUNTER — Other Ambulatory Visit: Payer: Self-pay | Admitting: Adult Health

## 2018-09-24 NOTE — Telephone Encounter (Signed)
Sent to the pharmacy by e-scribe. 

## 2018-10-03 ENCOUNTER — Other Ambulatory Visit (INDEPENDENT_AMBULATORY_CARE_PROVIDER_SITE_OTHER): Payer: Managed Care, Other (non HMO)

## 2018-10-03 ENCOUNTER — Other Ambulatory Visit: Payer: Self-pay

## 2018-10-03 DIAGNOSIS — E139 Other specified diabetes mellitus without complications: Secondary | ICD-10-CM

## 2018-10-03 LAB — HEMOGLOBIN A1C: Hgb A1c MFr Bld: 9.4 % — ABNORMAL HIGH (ref 4.6–6.5)

## 2018-10-04 ENCOUNTER — Ambulatory Visit (INDEPENDENT_AMBULATORY_CARE_PROVIDER_SITE_OTHER): Payer: Managed Care, Other (non HMO) | Admitting: Adult Health

## 2018-10-04 ENCOUNTER — Encounter: Payer: Managed Care, Other (non HMO) | Admitting: Adult Health

## 2018-10-04 ENCOUNTER — Encounter: Payer: Self-pay | Admitting: Adult Health

## 2018-10-04 DIAGNOSIS — Z760 Encounter for issue of repeat prescription: Secondary | ICD-10-CM | POA: Diagnosis not present

## 2018-10-04 DIAGNOSIS — E139 Other specified diabetes mellitus without complications: Secondary | ICD-10-CM | POA: Diagnosis not present

## 2018-10-04 MED ORDER — METFORMIN HCL 1000 MG PO TABS: 1000.0000 mg | ORAL_TABLET | Freq: Two times a day (BID) | ORAL | 0 refills | Status: DC

## 2018-10-04 MED ORDER — ONETOUCH VERIO VI STRP: 1.0000 | ORAL_STRIP | Freq: Three times a day (TID) | 3 refills | Status: DC

## 2018-10-04 MED ORDER — ONETOUCH ULTRASOFT LANCETS MISC: 1.0000 | Freq: Three times a day (TID) | 3 refills | Status: DC

## 2018-10-04 NOTE — Progress Notes (Signed)
Virtual Visit via Video Note  I connected with Autumn Acevedo on 10/04/18 at  9:00 AM EDT by a video enabled telemedicine application and verified that I am speaking with the correct person using two identifiers.  Location patient: home Location provider:work or home office Persons participating in the virtual visit: patient, provider  I discussed the limitations of evaluation and management by telemedicine and the availability of in person appointments. The patient expressed understanding and agreed to proceed.   HPI:  She is being evaluated today for 27-monthfollow-up regarding diabetes mellitus.  She is currently prescribed glipizide 10 mg extended release and metformin 1000 mg twice daily.  She has been out of testing strips so she has not been monitoring her blood sugars at home.  Her A1c 3 months ago was 8.0.  Since she has been living under COVID restrictions she has not been able to get to the gym but has recently started walking outside.  She does report that she has been cooking at home more frequently watching her carbs, and not eating out.   Unfortunately, she has been drinking more white wine in the evening.  ROS: See pertinent positives and negatives per HPI.  Past Medical History:  Diagnosis Date  . Allergy   . Diabetes mellitus without complication ([place]    Type2   . Goiter   . Hypertension   . Hypothyroid   . ITP (idiopathic thrombocytopenic purpura)     Past Surgical History:  Procedure Laterality Date  . no surgeries at this time      Family History  Problem Relation Age of Onset  . Hypertension Mother        fhx  . COPD Father   . Lung cancer Father   . Pancreatic cancer Brother   . Colon cancer Sister       Current Outpatient Medications:  .  albuterol (PROVENTIL HFA;VENTOLIN HFA) 108 (90 Base) MCG/ACT inhaler, Inhale 2 puffs into the lungs as needed for wheezing., Disp: 2 Inhaler, Rfl: 3 .  Blood Glucose Monitoring Suppl (ONETOUCH VERIO) w/Device  KIT, USE TO TEST BLOOD GLUCOSE TWICE DAILY, Disp: 1 kit, Rfl: 0 .  glipiZIDE (GLUCOTROL XL) 10 MG 24 hr tablet, TAKE 1 TABLET (10 MG TOTAL) BY MOUTH DAILY WITH BREAKFAST., Disp: 90 tablet, Rfl: 0 .  levothyroxine (SYNTHROID, LEVOTHROID) 88 MCG tablet, Take 1 tablet (88 mcg total) by mouth daily., Disp: 90 tablet, Rfl: 3 .  lisinopril (PRINIVIL,ZESTRIL) 10 MG tablet, Take 1 tablet (10 mg total) by mouth daily., Disp: 90 tablet, Rfl: 3 .  lisinopril (PRINIVIL,ZESTRIL) 5 MG tablet, Take 1 tablet (5 mg total) by mouth daily., Disp: 90 tablet, Rfl: 3 .  metFORMIN (GLUCOPHAGE) 1000 MG tablet, TAKE 1 TABLET TWICE A DAY, Disp: 180 tablet, Rfl: 0 .  Multiple Vitamin (MULTIVITAMIN WITH MINERALS) TABS, Take 1 tablet by mouth daily., Disp: , Rfl:   EXAM:  VITALS per patient if applicable:  GENERAL: alert, oriented, appears well and in no acute distress  HEENT: atraumatic, conjunttiva clear, no obvious abnormalities on inspection of external nose and ears  NECK: normal movements of the head and neck  LUNGS: on inspection no signs of respiratory distress, breathing rate appears normal, no obvious gross SOB, gasping or wheezing  CV: no obvious cyanosis  MS: moves all visible extremities without noticeable abnormality  PSYCH/NEURO: pleasant and cooperative, no obvious depression or anxiety, speech and thought processing grossly intact  ASSESSMENT AND PLAN:  Discussed the following assessment and plan:  1. Diabetes 1.5, managed as type 2 ([place]) -A1c has increased to 9.4. -Autumn Acevedo's visit I had her check the sugar content of her white line which was 14 g of sugar per serving.  Advised her to cut out white wine and she needs to walk more.  Will send in lancets and testing strips we will have her monitor her blood sugars for 30 days and then do a follow-up appointment.  At this time if her blood sugars have not started to come down we will need to add a medication.   - glucose blood (ONETOUCH VERIO) test  strip; 1 each by Other route 3 (three) times daily. Use as instructed  Dispense: 300 each; Refill: 3 - Lancets (ONETOUCH ULTRASOFT) lancets; 1 each by Other route 3 (three) times daily. Use as instructed  Dispense: 300 each; Refill: 3 - metFORMIN (GLUCOPHAGE) 1000 MG tablet; Take 1 tablet (1,000 mg total) by mouth 2 (two) times daily.  Dispense: 180 tablet; Refill: 0    I discussed the assessment and treatment plan with the patient. The patient was provided an opportunity to ask questions and all were answered. The patient agreed with the plan and demonstrated an understanding of the instructions.   The patient was advised to call back or seek an in-person evaluation if the symptoms worsen or if the condition fails to improve as anticipated.   Dorothyann Peng, NP

## 2018-10-11 ENCOUNTER — Telehealth: Payer: Self-pay | Admitting: *Deleted

## 2018-10-11 ENCOUNTER — Encounter: Payer: Self-pay | Admitting: Adult Health

## 2018-10-11 NOTE — Telephone Encounter (Signed)
Copied from [place] [id number]. Topic: Appointment Scheduling - Scheduling Inquiry for Clinic >> Oct 11, 2018  2:32 PM Rayann Heman wrote: Reason for CRM: pt called and stated that she will need to schedule follow up visit. Please advise

## 2018-10-15 MED ORDER — ACCU-CHEK GUIDE VI STRP: ORAL_STRIP | 3 refills | Status: DC

## 2018-10-15 MED ORDER — ACCU-CHEK GUIDE ME W/DEVICE KIT: 1.0000 | PACK | Freq: Once | 0 refills | Status: AC

## 2018-10-15 MED ORDER — ACCU-CHEK MULTICLIX LANCETS MISC: 3 refills | Status: DC

## 2018-10-15 NOTE — Telephone Encounter (Signed)
Pt now scheduled for follow up on 11/05/2018.  Nothing further needed.

## 2018-10-15 NOTE — Telephone Encounter (Signed)
Sent to the pharmacy by e-scribe.  Pt notified.  Nothing further needed.

## 2018-10-16 ENCOUNTER — Other Ambulatory Visit: Payer: Self-pay | Admitting: Adult Health

## 2018-10-16 ENCOUNTER — Telehealth: Payer: Self-pay

## 2018-10-16 DIAGNOSIS — Z760 Encounter for issue of repeat prescription: Secondary | ICD-10-CM

## 2018-10-16 DIAGNOSIS — E039 Hypothyroidism, unspecified: Secondary | ICD-10-CM

## 2018-10-16 DIAGNOSIS — I10 Essential (primary) hypertension: Secondary | ICD-10-CM

## 2018-10-16 NOTE — Telephone Encounter (Signed)
Received a PA on OneTouch Verio test strips. No PA needed, brand of test strips have already been changed.

## 2018-10-17 MED ORDER — LISINOPRIL 10 MG PO TABS: 10.0000 mg | ORAL_TABLET | Freq: Every day | ORAL | 0 refills | Status: DC

## 2018-10-17 MED ORDER — LISINOPRIL 5 MG PO TABS: 5.0000 mg | ORAL_TABLET | Freq: Every day | ORAL | 0 refills | Status: DC

## 2018-10-17 NOTE — Telephone Encounter (Signed)
Pt scheduled for cpx.  Medication sent to the pharmacy by e-scribe.

## 2018-11-05 ENCOUNTER — Encounter: Payer: Self-pay | Admitting: Adult Health

## 2018-11-05 ENCOUNTER — Other Ambulatory Visit: Payer: Self-pay

## 2018-11-05 ENCOUNTER — Ambulatory Visit (INDEPENDENT_AMBULATORY_CARE_PROVIDER_SITE_OTHER): Payer: Managed Care, Other (non HMO) | Admitting: Adult Health

## 2018-11-05 DIAGNOSIS — E139 Other specified diabetes mellitus without complications: Secondary | ICD-10-CM

## 2018-11-05 NOTE — Progress Notes (Signed)
Virtual Visit via Video Note  I connected with Autumn Acevedo  on 11/05/18 at 10:30 AM EDT by a video enabled telemedicine application and verified that I am speaking with the correct person using two identifiers.  Location patient: home Location provider:work or home office Persons participating in the virtual visit: patient, provider  I discussed the limitations of evaluation and management by telemedicine and the availability of in person appointments. The patient expressed understanding and agreed to proceed.   HPI: 58 year old female who is being evaluated today for 30-day follow-up regarding diabetes.  During her last visit her A1c had jumped to 9.4 from 8.0.  At this time she was not exercising but had been cooking more frequently at home.  She was drinking sodas and white wine in the evening.  She was advised to start exercising, stop drinking sodas and white wine and continue with dietary changes.  Over the last month she has been walking 3.5 miles, 3 to 4 days a week.  He has cut out soda and wine and has been drinking more water.  She continues to eat a heart healthy diet.  She has been monitoring her blood sugars at home  In the morning - BS range from 140-170 In the afternoon - BS range from 120-130 In the evening - BS range from 120-130  She is taking Glipizide and Metformin 1000 mg in the morning with breakfast and Metformin 1000 mg around 6 pm.   ROS: See pertinent positives and negatives per HPI.  Past Medical History:  Diagnosis Date  . Allergy   . Diabetes mellitus without complication ([place])    Type2   . Goiter   . Hypertension   . Hypothyroid   . ITP (idiopathic thrombocytopenic purpura)     Past Surgical History:  Procedure Laterality Date  . no surgeries at this time      Family History  Problem Relation Age of Onset  . Hypertension Mother        fhx  . COPD Father   . Lung cancer Father   . Pancreatic cancer Brother   . Colon cancer Sister         Current Outpatient Medications:  .  albuterol (PROVENTIL HFA;VENTOLIN HFA) 108 (90 Base) MCG/ACT inhaler, Inhale 2 puffs into the lungs as needed for wheezing., Disp: 2 Inhaler, Rfl: 3 .  glipiZIDE (GLUCOTROL XL) 10 MG 24 hr tablet, TAKE 1 TABLET (10 MG TOTAL) BY MOUTH DAILY WITH BREAKFAST., Disp: 90 tablet, Rfl: 0 .  glucose blood (ACCU-CHEK GUIDE) test strip, USE TO TEST BLOOD GLUCOSE TWICE DAILY, Disp: 200 each, Rfl: 3 .  Lancets (ACCU-CHEK MULTICLIX) lancets, USE TO TEST BLOOD GLUCOSE TWICE DAILY, Disp: 200 each, Rfl: 3 .  levothyroxine (SYNTHROID) 88 MCG tablet, TAKE 1 TABLET BY MOUTH EVERY DAY, Disp: 90 tablet, Rfl: 0 .  lisinopril (ZESTRIL) 10 MG tablet, Take 1 tablet (10 mg total) by mouth daily., Disp: 90 tablet, Rfl: 0 .  lisinopril (ZESTRIL) 5 MG tablet, Take 1 tablet (5 mg total) by mouth daily., Disp: 90 tablet, Rfl: 0 .  metFORMIN (GLUCOPHAGE) 1000 MG tablet, Take 1 tablet (1,000 mg total) by mouth 2 (two) times daily., Disp: 180 tablet, Rfl: 0 .  Multiple Vitamin (MULTIVITAMIN WITH MINERALS) TABS, Take 1 tablet by mouth daily., Disp: , Rfl:   EXAM:  VITALS per patient if applicable:  GENERAL: alert, oriented, appears well and in no acute distress  HEENT: atraumatic, conjunttiva clear, no obvious abnormalities on inspection of  external nose and ears  NECK: normal movements of the head and neck  LUNGS: on inspection no signs of respiratory distress, breathing rate appears normal, no obvious gross SOB, gasping or wheezing  CV: no obvious cyanosis  MS: moves all visible extremities without noticeable abnormality  PSYCH/NEURO: pleasant and cooperative, no obvious depression or anxiety, speech and thought processing grossly intact  ASSESSMENT AND PLAN:  Discussed the following assessment and plan:  1. Diabetes 1.5, managed as type 2 ([place]) -Blood sugars have improved.  I will keep her on her current regimen until she follows up in September for her physical exam.   Congratulated on lifestyle modifications and encouraged to continue with what she is doing.  We will have her take metformin 1000 mg before bed instead of at 6 PM.    I discussed the assessment and treatment plan with the patient. The patient was provided an opportunity to ask questions and all were answered. The patient agreed with the plan and demonstrated an understanding of the instructions.   The patient was advised to call back or seek an in-person evaluation if the symptoms worsen or if the condition fails to improve as anticipated.   Dorothyann Peng, NP

## 2018-12-09 IMAGING — MG DIGITAL SCREENING BILATERAL MAMMOGRAM WITH TOMO AND CAD
8 series · 8 of 24 positions shown · non-contrast
Comparison: Previous exam(s).

CLINICAL DATA: Screening.

EXAM:
DIGITAL SCREENING BILATERAL MAMMOGRAM WITH TOMO AND CAD

[R MLO synth-2D]
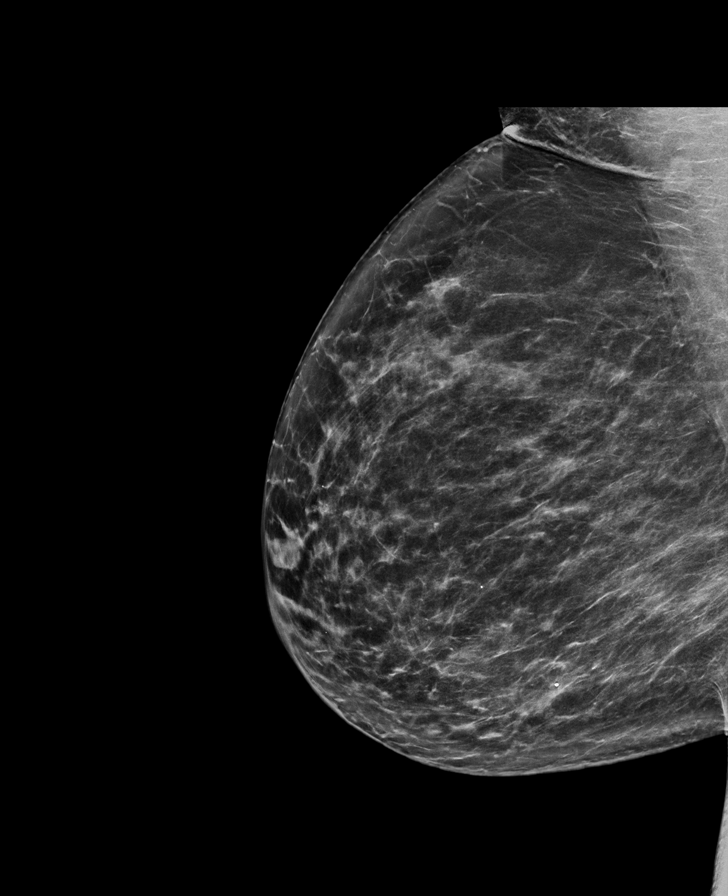

[R CC synth-2D]
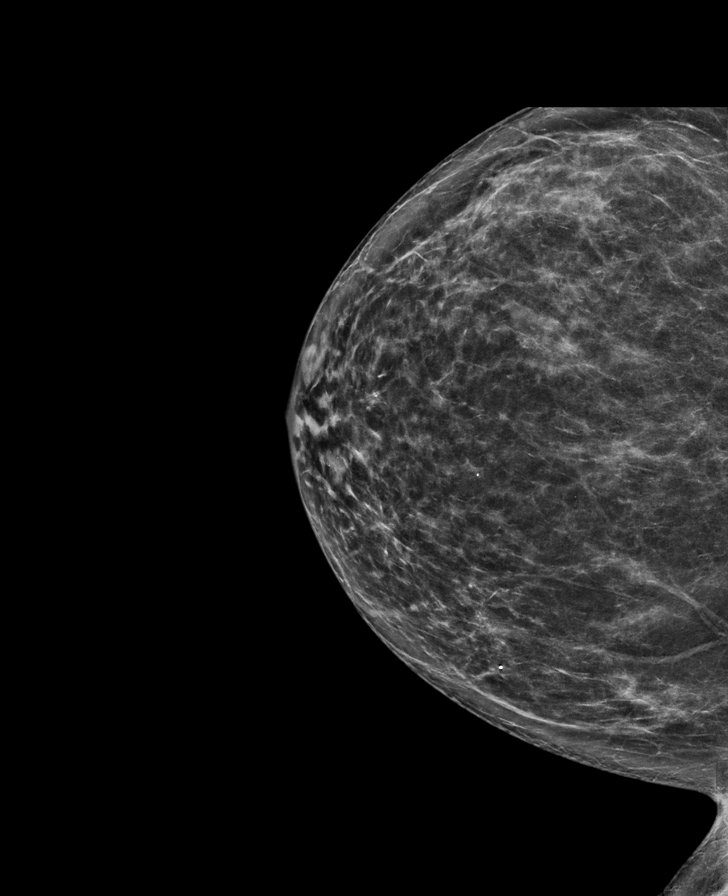

[L CC synth-2D]
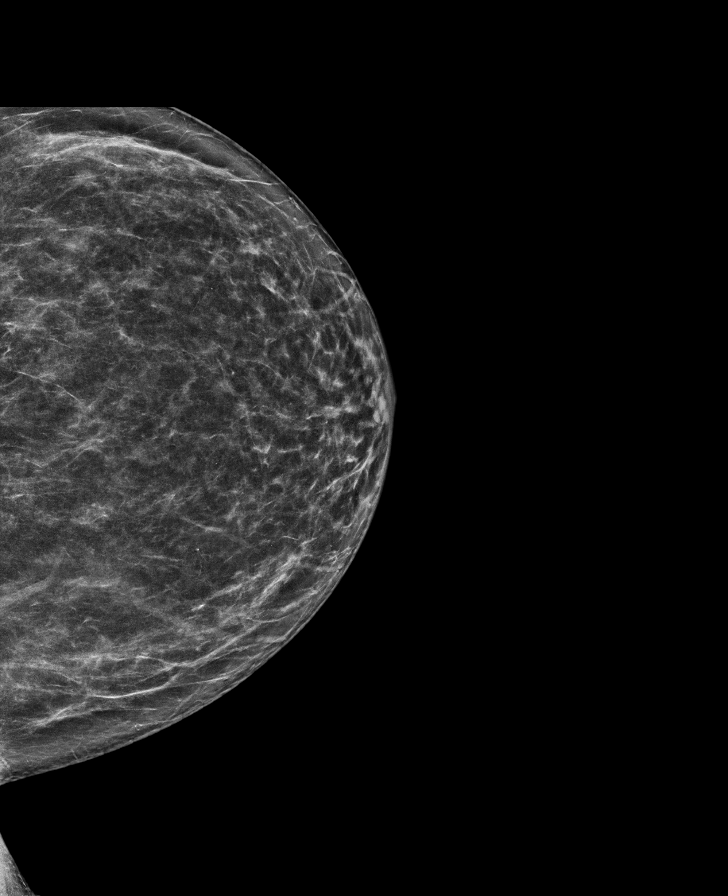

[L MLO synth-2D]
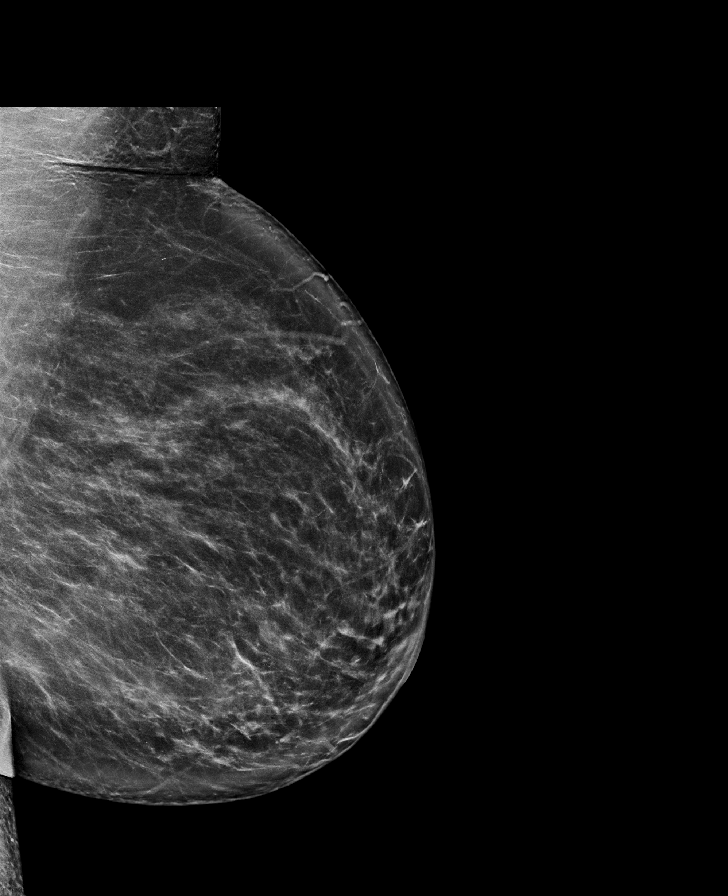

[L CC tomo · tomo slice 38/75.0]
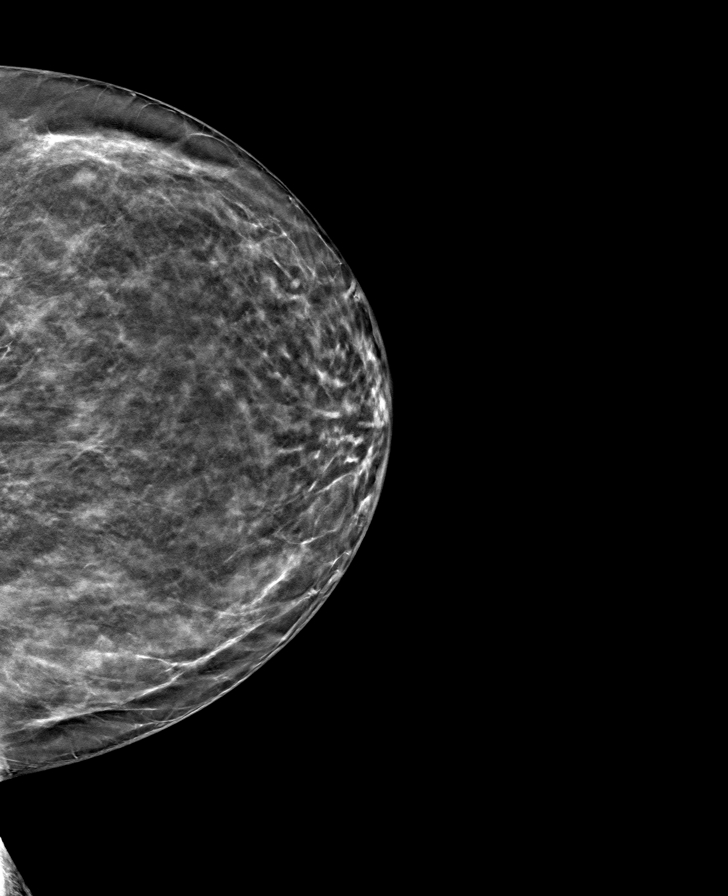

[R CC tomo · tomo slice 39/77.0]
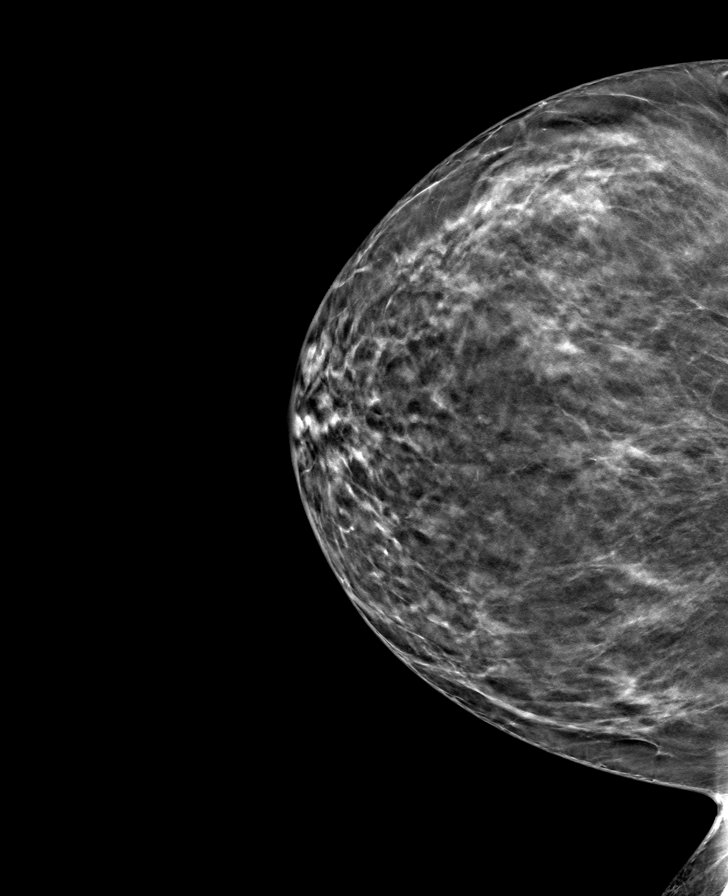

[L MLO tomo · tomo slice 45/90.0]
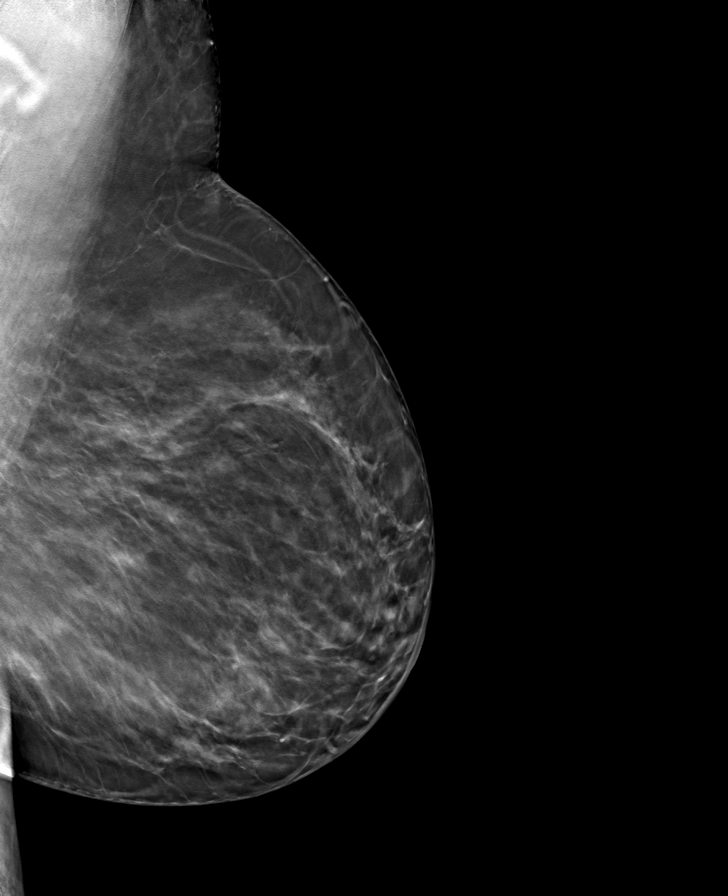

[R MLO tomo · tomo slice 45/89.0]
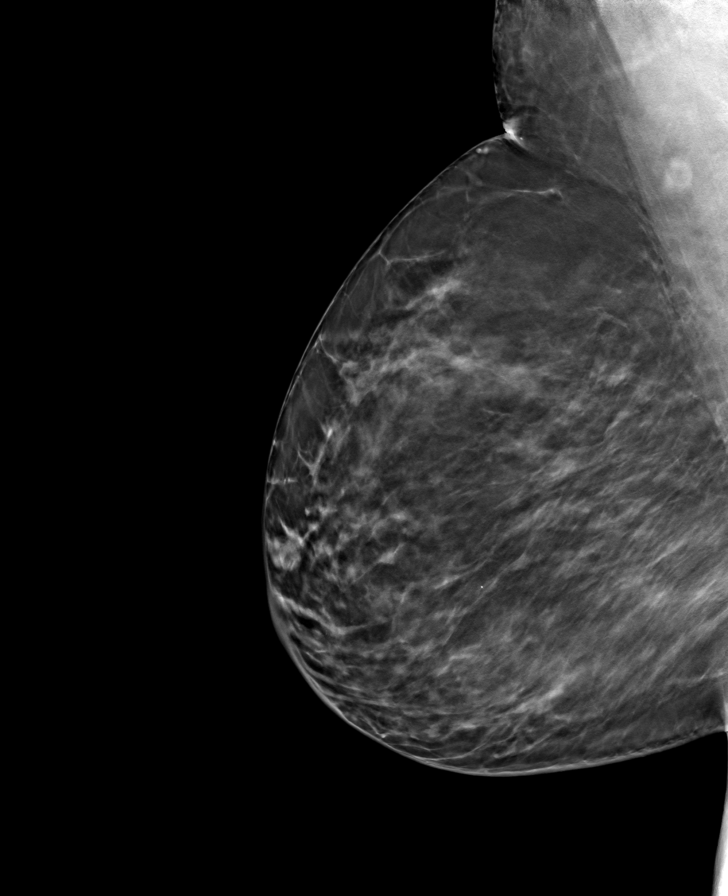

[8 of 24 positions shown; findings below may reference images not displayed]

ACR Breast Density Category c: The breast tissue is heterogeneously
dense, which may obscure small masses.
FINDINGS: There are no findings suspicious for malignancy. Images were
processed with CAD.
IMPRESSION: No mammographic evidence of malignancy. A result letter of this
screening mammogram will be mailed directly to the patient.

RECOMMENDATION:
Screening mammogram in one year. (Code:[id number]-U-LHB)

BI-RADS CATEGORY  1: Negative.

## 2018-12-20 ENCOUNTER — Other Ambulatory Visit: Payer: Self-pay | Admitting: Adult Health

## 2018-12-20 NOTE — Telephone Encounter (Signed)
Sent to the pharmacy by e-scribe for 90 days.  Pt has upcoming cpx on 12/26/2018.

## 2018-12-26 ENCOUNTER — Other Ambulatory Visit: Payer: Self-pay

## 2018-12-26 ENCOUNTER — Encounter: Payer: Self-pay | Admitting: Adult Health

## 2018-12-26 ENCOUNTER — Ambulatory Visit (INDEPENDENT_AMBULATORY_CARE_PROVIDER_SITE_OTHER): Payer: Managed Care, Other (non HMO) | Admitting: Adult Health

## 2018-12-26 VITALS — BP 130/90 | Temp 98.6°F | Ht 65.0 in | Wt 184.0 lb

## 2018-12-26 DIAGNOSIS — I10 Essential (primary) hypertension: Secondary | ICD-10-CM | POA: Diagnosis not present

## 2018-12-26 DIAGNOSIS — Z0000 Encounter for general adult medical examination without abnormal findings: Secondary | ICD-10-CM | POA: Diagnosis not present

## 2018-12-26 DIAGNOSIS — E039 Hypothyroidism, unspecified: Secondary | ICD-10-CM

## 2018-12-26 DIAGNOSIS — E139 Other specified diabetes mellitus without complications: Secondary | ICD-10-CM | POA: Diagnosis not present

## 2018-12-26 LAB — CBC WITH DIFFERENTIAL/PLATELET
Basophils Absolute: 0 10*3/uL (ref 0.0–0.1)
Basophils Relative: 0.7 % (ref 0.0–3.0)
Eosinophils Absolute: 0.1 10*3/uL (ref 0.0–0.7)
Eosinophils Relative: 1.8 % (ref 0.0–5.0)
HCT: 42.1 % (ref 36.0–46.0)
Hemoglobin: 13.4 g/dL (ref 12.0–15.0)
Lymphocytes Relative: 47.4 % — ABNORMAL HIGH (ref 12.0–46.0)
Lymphs Abs: 2.4 10*3/uL (ref 0.7–4.0)
MCHC: 31.9 g/dL (ref 30.0–36.0)
MCV: 72.9 fl — ABNORMAL LOW (ref 78.0–100.0)
Monocytes Absolute: 0.4 10*3/uL (ref 0.1–1.0)
Monocytes Relative: 7.4 % (ref 3.0–12.0)
Neutro Abs: 2.2 10*3/uL (ref 1.4–7.7)
Neutrophils Relative %: 42.7 % — ABNORMAL LOW (ref 43.0–77.0)
Platelets: 231 10*3/uL (ref 150.0–400.0)
RBC: 5.77 Mil/uL — ABNORMAL HIGH (ref 3.87–5.11)
RDW: 14.7 % (ref 11.5–15.5)
WBC: 5.1 10*3/uL (ref 4.0–10.5)

## 2018-12-26 LAB — COMPREHENSIVE METABOLIC PANEL
ALT: 24 U/L (ref 0–35)
AST: 17 U/L (ref 0–37)
Albumin: 4.5 g/dL (ref 3.5–5.2)
Alkaline Phosphatase: 94 U/L (ref 39–117)
BUN: 11 mg/dL (ref 6–23)
CO2: 27 mEq/L (ref 19–32)
Calcium: 9.7 mg/dL (ref 8.4–10.5)
Chloride: 101 mEq/L (ref 96–112)
Creatinine, Ser: 0.94 mg/dL (ref 0.40–1.20)
GFR: 74.08 mL/min (ref >60.00)
Glucose, Bld: 168 mg/dL — ABNORMAL HIGH (ref 70–99)
Potassium: 4.5 mEq/L (ref 3.5–5.1)
Sodium: 139 mEq/L (ref 135–145)
Total Bilirubin: 0.7 mg/dL (ref 0.2–1.2)
Total Protein: 7.2 g/dL (ref 6.0–8.3)

## 2018-12-26 LAB — TSH: TSH: 0.04 u[IU]/mL — ABNORMAL LOW (ref 0.35–4.50)

## 2018-12-26 LAB — LIPID PANEL
Cholesterol: 148 mg/dL (ref 0–200)
HDL: 38.2 mg/dL — ABNORMAL LOW (ref >39.00)
LDL Cholesterol: 82 mg/dL (ref 0–99)
NonHDL: 109.61
Total CHOL/HDL Ratio: 4
Triglycerides: 136 mg/dL (ref 0.0–149.0)
VLDL: 27.2 mg/dL (ref 0.0–40.0)

## 2018-12-26 LAB — HEMOGLOBIN A1C: Hgb A1c MFr Bld: 8.2 % — ABNORMAL HIGH (ref 4.6–6.5)

## 2018-12-26 NOTE — Progress Notes (Signed)
Subjective:    Patient ID: Autumn Acevedo, female    DOB: 02-Jul-1960, 58 y.o.   MRN: [id number]  HPI Patient presents for yearly preventative medicine examination. She is a pleasant 58 year old female who  has a past medical history of Allergy, Diabetes mellitus without complication ([place]), Goiter, Hypertension, Hypothyroid, and ITP (idiopathic thrombocytopenic purpura).  DM -is currently prescribed glipizide 10 mg extended release daily and metformin 1000 mg twice daily.  She is tolerating medications well with no known side effects.  In June 2020 her A1c increased from 8.0-9.4.  At this time she was not exercising but have been cooking more frequently at home, she was advised to start exercising, stop drinking sodas and white wine and continue with dietary changes.  She was also advised to start exercising.  On her 30-day follow-up she happy to report that she was walking about 4 miles, 3 to 4 days a week and had cut out soda and wine.  Her blood sugars at home are ranging from 90-170.  Hypertension - Controlled with lisinopril 15 mg daily.  She denies headaches, blurred vision, dizziness, chest pain or shortness of breath BP Readings from Last 3 Encounters:  12/26/18 130/90  07/03/18 (!) 150/90  04/03/18 130/84   Hypothyroidism - Controlled with Synthroid 88 mcg daily.  She did have an ultrasound of the thyroid in 2018 at which time thyroid mass did meet criteria for biopsy.  She wanted to wait on this  All immunizations and health maintenance protocols were reviewed with the patient and needed orders were placed.  Appropriate screening laboratory values were ordered for the patient including screening of hyperlipidemia, renal function and hepatic function.  Medication reconciliation,  past medical history, social history, problem list and allergies were reviewed in detail with the patient  Goals were established with regard to weight loss, exercise, and  diet in compliance with  medications. She is eating healthy and exercising a regular basis.   Wt Readings from Last 3 Encounters:  12/26/18 184 lb (83.5 kg)  07/03/18 186 lb (84.4 kg)  04/03/18 187 lb (84.8 kg)   End of life planning was discussed.  She is up-to-date on routine screening mammogram and colonoscopy. She is going to follow up with GYN for PAP   Review of Systems  Constitutional: Negative.   HENT: Negative.   Eyes: Negative.   Respiratory: Negative.   Cardiovascular: Negative.   Gastrointestinal: Negative.   Endocrine: Negative.   Genitourinary: Negative.   Musculoskeletal: Negative.   Skin: Negative.   Allergic/Immunologic: Negative.   Neurological: Negative.   Hematological: Negative.   Psychiatric/Behavioral: Negative.    Past Medical History:  Diagnosis Date  . Allergy   . Diabetes mellitus without complication ([place])    Type2   . Goiter   . Hypertension   . Hypothyroid   . ITP (idiopathic thrombocytopenic purpura)     Social History   Socioeconomic History  . Marital status: Single    Spouse name: Not on file  . Number of children: Not on file  . Years of education: Not on file  . Highest education level: Not on file  Occupational History  . Not on file  Social Needs  . Financial resource strain: Not on file  . Food insecurity    Worry: Not on file    Inability: Not on file  . Transportation needs    Medical: Not on file    Non-medical: Not on file  Tobacco Use  .  Smoking status: Never Smoker  . Smokeless tobacco: Never Used  . Tobacco comment: never used tobacco  Substance and Sexual Activity  . Alcohol use: Yes    Alcohol/week: 1.0 standard drinks    Types: 1 Standard drinks or equivalent per week    Comment: 2-3 drinks a week   . Drug use: No  . Sexual activity: Not on file  Lifestyle  . Physical activity    Days per week: Not on file    Minutes per session: Not on file  . Stress: Not on file  Relationships  . Social Herbalist on phone:  Not on file    Gets together: Not on file    Attends religious service: Not on file    Active member of club or organization: Not on file    Attends meetings of clubs or organizations: Not on file    Relationship status: Not on file  . Intimate partner violence    Fear of current or ex partner: Not on file    Emotionally abused: Not on file    Physically abused: Not on file    Forced sexual activity: Not on file  Other Topics Concern  . 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 Has two grand babies   She enjoys reading and going to the gym. Hanging out with friends.        Past Surgical History:  Procedure Laterality Date  . no surgeries at this time      Family History  Problem Relation Age of Onset  . Hypertension Mother        fhx  . COPD Father   . Lung cancer Father   . Pancreatic cancer Brother   . Colon cancer Sister     No Known Allergies  Current Outpatient Medications on File Prior to Visit  Medication Sig Dispense Refill  . albuterol (PROVENTIL HFA;VENTOLIN HFA) 108 (90 Base) MCG/ACT inhaler Inhale 2 puffs into the lungs as needed for wheezing. 2 Inhaler 3  . BLACK COHOSH PO Take by mouth.    Marland Kitchen glipiZIDE (GLUCOTROL XL) 10 MG 24 hr tablet TAKE 1 TABLET (10 MG TOTAL) BY MOUTH DAILY WITH BREAKFAST. 90 tablet 0  . glucose blood (ACCU-CHEK GUIDE) test strip USE TO TEST BLOOD GLUCOSE TWICE DAILY 200 each 3  . Lancets (ACCU-CHEK MULTICLIX) lancets USE TO TEST BLOOD GLUCOSE TWICE DAILY 200 each 3  . levothyroxine (SYNTHROID) 88 MCG tablet TAKE 1 TABLET BY MOUTH EVERY DAY 90 tablet 0  . lisinopril (ZESTRIL) 10 MG tablet Take 1 tablet (10 mg total) by mouth daily. 90 tablet 0  . lisinopril (ZESTRIL) 5 MG tablet Take 1 tablet (5 mg total) by mouth daily. 90 tablet 0  . metFORMIN (GLUCOPHAGE) 1000 MG tablet Take 1 tablet (1,000 mg total) by mouth 2 (two) times daily. 180  tablet 0  . Multiple Vitamin (MULTIVITAMIN WITH MINERALS) TABS Take 1 tablet by mouth daily.    . Turmeric (QC TUMERIC COMPLEX PO) Take by mouth.     No current facility-administered medications on file prior to visit.     BP 130/90   Temp 98.6 F (37 C) (Oral)   Ht 5\' 5"  (1.651 m) Comment: WITHOUT SHOES  Wt 184 lb (83.5 kg)   LMP 05/17/2016   BMI 30.62 kg/m  Objective:   Physical Exam Vitals signs and nursing note reviewed.  Constitutional:      General: She is not in acute distress.    Appearance: Normal appearance. She is not diaphoretic.  HENT:     Head: Normocephalic and atraumatic.     Right Ear: Tympanic membrane, ear canal and external ear normal. There is no impacted cerumen.     Left Ear: Tympanic membrane, ear canal and external ear normal. There is no impacted cerumen.     Nose: Nose normal. No congestion or rhinorrhea.     Mouth/Throat:     Mouth: Mucous membranes are moist.     Pharynx: Oropharynx is clear. No oropharyngeal exudate or posterior oropharyngeal erythema.  Eyes:     General: No scleral icterus.       Right eye: No discharge.        Left eye: No discharge.     Extraocular Movements: Extraocular movements intact.     Conjunctiva/sclera: Conjunctivae normal.     Pupils: Pupils are equal, round, and reactive to light.  Neck:     Musculoskeletal: Normal range of motion and neck supple. No neck rigidity or muscular tenderness.     Thyroid: Thyroid mass present. No thyromegaly.     Vascular: No carotid bruit or JVD.     Trachea: No tracheal deviation.     Comments: + goiter   Cardiovascular:     Rate and Rhythm: Normal rate and regular rhythm.     Pulses: Normal pulses.     Heart sounds: Normal heart sounds. No murmur. No friction rub. No gallop.   Pulmonary:     Effort: Pulmonary effort is normal. No respiratory distress.     Breath sounds: Normal breath sounds. No stridor. No wheezing, rhonchi or rales.  Chest:     Chest wall: No  tenderness.  Abdominal:     General: Bowel sounds are normal. There is no distension.     Palpations: Abdomen is soft. There is no mass.     Tenderness: There is no abdominal tenderness. There is no right CVA tenderness, left CVA tenderness, guarding or rebound.     Hernia: No hernia is present.  Musculoskeletal: Normal range of motion.        General: No swelling, tenderness, deformity or signs of injury.     Right lower leg: No edema.     Left lower leg: No edema.  Lymphadenopathy:     Cervical: No cervical adenopathy.  Skin:    General: Skin is warm and dry.     Capillary Refill: Capillary refill takes less than 2 seconds.     Coloration: Skin is not jaundiced or pale.     Findings: No bruising, erythema, lesion or rash.  Neurological:     General: No focal deficit present.     Mental Status: She is alert and oriented to person, place, and time.     Cranial Nerves: No cranial nerve deficit.     Sensory: No sensory deficit.     Motor: No weakness or abnormal muscle tone.     Coordination: Coordination normal.     Gait: Gait normal.     Deep Tendon Reflexes: Reflexes normal.  Psychiatric:        Mood and Affect: Mood normal.        Behavior: Behavior normal.        Thought Content: Thought content normal.        Judgment: Judgment normal.  Assessment & Plan:  1. Routine general medical examination at a health care facility  - CBC with Differential/Platelet - Comprehensive metabolic panel - Lipid panel - TSH - Hemoglobin A1c  2. Diabetes 1.5, managed as type 2 ([place]) - Consider adding agent  - Follow up in one year - Encouraged to continue to exercise and eat healthy  - CBC with Differential/Platelet - Comprehensive metabolic panel - Lipid panel - TSH - Hemoglobin A1c  3. Essential hypertension - No change in medications  - CBC with Differential/Platelet - Comprehensive metabolic panel - Lipid panel - TSH - Hemoglobin A1c  4. Acquired hypothyroidism   - CBC with Differential/Platelet - Comprehensive metabolic panel - Lipid panel - TSH - Hemoglobin A1c - US Soft Tissue Head/Neck; Future - Thyroid Peroxidase Antibodies (TPO) ([facility])  Dorothyann Peng, NP

## 2018-12-27 LAB — THYROID PEROXIDASE ANTIBODIES (TPO) (REFL): Thyroperoxidase Ab SerPl-aCnc: 1 IU/mL (ref <9)

## 2018-12-28 ENCOUNTER — Other Ambulatory Visit: Payer: Self-pay | Admitting: Adult Health

## 2018-12-28 DIAGNOSIS — E139 Other specified diabetes mellitus without complications: Secondary | ICD-10-CM

## 2018-12-31 NOTE — Telephone Encounter (Signed)
Sent to the pharmacy by e-scribe for 90 days. 

## 2019-01-01 ENCOUNTER — Ambulatory Visit
Admission: RE | Admit: 2019-01-01 | Discharge: 2019-01-01 | Disposition: A | Discharge status: Home / Self Care | Payer: Managed Care, Other (non HMO) | Source: Ambulatory Visit | Attending: Adult Health | Admitting: Adult Health

## 2019-01-01 DIAGNOSIS — E039 Hypothyroidism, unspecified: Secondary | ICD-10-CM

## 2019-01-02 ENCOUNTER — Telehealth: Payer: Self-pay | Admitting: Adult Health

## 2019-01-02 DIAGNOSIS — E041 Nontoxic single thyroid nodule: Secondary | ICD-10-CM

## 2019-01-02 NOTE — Telephone Encounter (Signed)
Spoke to patient and informed her of her ultrasound of her thyroid.  It is recommended biopsy and she is okay with this.  Will place order and follow-up with her once biopsy has resulted

## 2019-01-07 ENCOUNTER — Other Ambulatory Visit: Payer: Self-pay | Admitting: Adult Health

## 2019-01-07 DIAGNOSIS — I10 Essential (primary) hypertension: Secondary | ICD-10-CM

## 2019-01-07 DIAGNOSIS — Z760 Encounter for issue of repeat prescription: Secondary | ICD-10-CM

## 2019-01-08 ENCOUNTER — Other Ambulatory Visit: Payer: Self-pay | Admitting: Adult Health

## 2019-01-08 DIAGNOSIS — Z1231 Encounter for screening mammogram for malignant neoplasm of breast: Secondary | ICD-10-CM

## 2019-01-11 ENCOUNTER — Other Ambulatory Visit: Payer: Self-pay | Admitting: Adult Health

## 2019-01-11 DIAGNOSIS — E039 Hypothyroidism, unspecified: Secondary | ICD-10-CM

## 2019-01-23 ENCOUNTER — Other Ambulatory Visit (HOSPITAL_COMMUNITY)
Admission: RE | Admit: 2019-01-23 | Discharge: 2019-01-23 | Disposition: A | Discharge status: Home / Self Care | Payer: Managed Care, Other (non HMO) | Source: Ambulatory Visit | Attending: Radiology | Admitting: Radiology

## 2019-01-23 ENCOUNTER — Ambulatory Visit
Admission: RE | Admit: 2019-01-23 | Discharge: 2019-01-23 | Disposition: A | Discharge status: Home / Self Care | Payer: Managed Care, Other (non HMO) | Source: Ambulatory Visit | Attending: Adult Health | Admitting: Adult Health

## 2019-01-23 DIAGNOSIS — E041 Nontoxic single thyroid nodule: Secondary | ICD-10-CM | POA: Diagnosis present

## 2019-01-23 DIAGNOSIS — D34 Benign neoplasm of thyroid gland: Secondary | ICD-10-CM | POA: Diagnosis not present

## 2019-01-24 LAB — CYTOLOGY - NON PAP

## 2019-02-26 ENCOUNTER — Other Ambulatory Visit: Payer: Self-pay

## 2019-02-26 ENCOUNTER — Ambulatory Visit
Admission: RE | Admit: 2019-02-26 | Discharge: 2019-02-26 | Disposition: A | Discharge status: Home / Self Care | Payer: Managed Care, Other (non HMO) | Source: Ambulatory Visit | Attending: Adult Health | Admitting: Adult Health

## 2019-02-26 DIAGNOSIS — Z1231 Encounter for screening mammogram for malignant neoplasm of breast: Secondary | ICD-10-CM

## 2019-03-26 ENCOUNTER — Other Ambulatory Visit: Payer: Self-pay

## 2019-03-27 ENCOUNTER — Ambulatory Visit (INDEPENDENT_AMBULATORY_CARE_PROVIDER_SITE_OTHER): Payer: Managed Care, Other (non HMO) | Admitting: Adult Health

## 2019-03-27 ENCOUNTER — Encounter: Payer: Self-pay | Admitting: Adult Health

## 2019-03-27 VITALS — BP 144/92 | Temp 97.3°F | Wt 187.0 lb

## 2019-03-27 DIAGNOSIS — I10 Essential (primary) hypertension: Secondary | ICD-10-CM | POA: Diagnosis not present

## 2019-03-27 DIAGNOSIS — Z760 Encounter for issue of repeat prescription: Secondary | ICD-10-CM

## 2019-03-27 DIAGNOSIS — J0140 Acute pansinusitis, unspecified: Secondary | ICD-10-CM | POA: Diagnosis not present

## 2019-03-27 DIAGNOSIS — E139 Other specified diabetes mellitus without complications: Secondary | ICD-10-CM

## 2019-03-27 LAB — POCT GLYCOSYLATED HEMOGLOBIN (HGB A1C): HbA1c, POC (controlled diabetic range): 8 % — AB (ref 0.0–7.0)

## 2019-03-27 MED ORDER — GLIPIZIDE ER 10 MG PO TB24: 10.0000 mg | ORAL_TABLET | Freq: Every day | ORAL | 0 refills | Status: DC

## 2019-03-27 MED ORDER — LISINOPRIL 5 MG PO TABS: 5.0000 mg | ORAL_TABLET | Freq: Every day | ORAL | 3 refills | Status: DC

## 2019-03-27 MED ORDER — LISINOPRIL 10 MG PO TABS: 10.0000 mg | ORAL_TABLET | Freq: Every day | ORAL | 3 refills | Status: DC

## 2019-03-27 MED ORDER — LEVOTHYROXINE SODIUM 88 MCG PO TABS: 88.0000 ug | ORAL_TABLET | Freq: Every day | ORAL | 3 refills | Status: DC

## 2019-03-27 MED ORDER — DOXYCYCLINE HYCLATE 100 MG PO CAPS: 100.0000 mg | ORAL_CAPSULE | Freq: Two times a day (BID) | ORAL | 0 refills | Status: DC

## 2019-03-27 MED ORDER — METFORMIN HCL 1000 MG PO TABS: 1000.0000 mg | ORAL_TABLET | Freq: Two times a day (BID) | ORAL | 0 refills | Status: DC

## 2019-03-27 NOTE — Progress Notes (Signed)
Subjective:    Patient ID: Autumn Acevedo, female    DOB: 06/08/1960, 58 y.o.   MRN: [id number]  HPI 58 year old female who is being evaluated today for 30-month follow-up regarding diabetes mellitus.  She is currently prescribed glipizide 10 mg extended release and metformin 1000 mg twice daily. She has not been exercising as she once did. She has cut out cards for the most part. She is cooking at home and is eating earlier in the evening. The highest her blood sugars have been have been in the 160's.  Her blood sugars are usually more elevated in the morning when she wakes up and then slowly decrease as the day goes on.  Lab Results  Component Value Date   HGBA1C 8.2 (H) 12/26/2018   Hypertension -she is taking lisinopril 15 mg daily.  She does not check her blood pressure at home.  Denies dizziness, lightheadedness, chest pain, or shortness of breath.  Her blood pressure is running elevated today in the office but she has not taken her medication this morning BP Readings from Last 3 Encounters:  03/27/19 (!) 144/92  12/26/18 130/90  07/03/18 (!) 150/90   Additionally, she complains of an acute issue that has been present on and off for the last month.  Symptoms include sinus pain and pressure, semiproductive cough with discolored sputum, and rhinorrhea.  She denies feeling feverish or having chills.  Denies nausea, vomiting, or diarrhea.  She has been taking Mucinex with little to no relief.   Review of Systems See HPI   Past Medical History:  Diagnosis Date  . Allergy   . Diabetes mellitus without complication ([place])    Type2   . Goiter   . Hypertension   . Hypothyroid   . ITP (idiopathic thrombocytopenic purpura)     Social History   Socioeconomic History  . Marital status: Single    Spouse name: Not on file  . Number of children: Not on file  . Years of education: Not on file  . Highest education level: Not on file  Occupational History  . Not on file  Social  Needs  . Financial resource strain: Not on file  . Food insecurity    Worry: Not on file    Inability: Not on file  . Transportation needs    Medical: Not on file    Non-medical: Not on file  Tobacco Use  . Smoking status: Never Smoker  . Smokeless tobacco: Never Used  . Tobacco comment: never used tobacco  Substance and Sexual Activity  . Alcohol use: Yes    Alcohol/week: 1.0 standard drinks    Types: 1 Standard drinks or equivalent per week    Comment: 2-3 drinks a week   . Drug use: No  . Sexual activity: Not on file  Lifestyle  . Physical activity    Days per week: Not on file    Minutes per session: Not on file  . Stress: Not on file  Relationships  . Social Herbalist on phone: Not on file    Gets together: Not on file    Attends religious service: Not on file    Active member of club or organization: Not on file    Attends meetings of clubs or organizations: Not on file    Relationship status: Not on file  . Intimate partner violence    Fear of current or ex partner: Not on file    Emotionally abused: Not on file  Physically abused: Not on file    Forced sexual activity: Not on file  Other Topics Concern  . 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 Has two grand babies   She enjoys reading and going to the gym. Hanging out with friends.        Past Surgical History:  Procedure Laterality Date  . no surgeries at this time      Family History  Problem Relation Age of Onset  . Hypertension Mother        fhx  . COPD Father   . Lung cancer Father   . Pancreatic cancer Brother   . Colon cancer Sister     No Known Allergies  Current Outpatient Medications on File Prior to Visit  Medication Sig Dispense Refill  . albuterol (PROVENTIL HFA;VENTOLIN HFA) 108 (90 Base) MCG/ACT inhaler Inhale 2 puffs into the lungs as needed for wheezing. 2 Inhaler  3  . BLACK COHOSH PO Take by mouth.    Marland Kitchen glipiZIDE (GLUCOTROL XL) 10 MG 24 hr tablet TAKE 1 TABLET (10 MG TOTAL) BY MOUTH DAILY WITH BREAKFAST. 90 tablet 0  . glucose blood (ACCU-CHEK GUIDE) test strip USE TO TEST BLOOD GLUCOSE TWICE DAILY 200 each 3  . Lancets (ACCU-CHEK MULTICLIX) lancets USE TO TEST BLOOD GLUCOSE TWICE DAILY 200 each 3  . levothyroxine (SYNTHROID) 88 MCG tablet TAKE 1 TABLET BY MOUTH EVERY DAY 90 tablet 0  . lisinopril (ZESTRIL) 10 MG tablet TAKE 1 TABLET BY MOUTH EVERY DAY 90 tablet 0  . lisinopril (ZESTRIL) 5 MG tablet TAKE 1 TABLET BY MOUTH EVERY DAY 90 tablet 0  . metFORMIN (GLUCOPHAGE) 1000 MG tablet TAKE 1 TABLET BY MOUTH TWICE A DAY 180 tablet 0  . Multiple Vitamin (MULTIVITAMIN WITH MINERALS) TABS Take 1 tablet by mouth daily.    . Turmeric (QC TUMERIC COMPLEX PO) Take by mouth.     No current facility-administered medications on file prior to visit.     LMP 05/17/2016       Objective:   Physical Exam Vitals signs and nursing note reviewed.  Constitutional:      Appearance: Normal appearance.  HENT:     Head: Normocephalic.     Right Ear: Tympanic membrane, ear canal and external ear normal. There is no impacted cerumen.     Left Ear: Tympanic membrane, ear canal and external ear normal. There is no impacted cerumen.     Nose:     Right Turbinates: Enlarged and swollen.     Left Turbinates: Enlarged and swollen.     Right Sinus: Maxillary sinus tenderness and frontal sinus tenderness present.     Left Sinus: Maxillary sinus tenderness and frontal sinus tenderness present.     Mouth/Throat:     Mouth: Mucous membranes are moist.     Pharynx: Oropharynx is clear. No oropharyngeal exudate.  Eyes:     Extraocular Movements: Extraocular movements intact.  Cardiovascular:     Rate and Rhythm: Regular rhythm.     Pulses: Normal pulses.     Heart sounds: Normal heart sounds.  Pulmonary:     Effort: Pulmonary effort is normal.     Breath sounds: Normal  breath sounds.  Skin:    General: Skin is warm and dry.  Neurological:     General: No focal deficit present.  Mental Status: She is alert and oriented to person, place, and time.  Psychiatric:        Mood and Affect: Mood normal.        Behavior: Behavior normal.        Thought Content: Thought content normal.        Judgment: Judgment normal.       Assessment & Plan:  1. Diabetes 1.5, managed as type 2 ([place])  - POCT A1C- 8.0 -She has improved slightly.  She has left over 5 mg glipizide at home- will have her start taking this in the evening.  She will follow-up with me in 2 to 3 weeks via MyChart with blood sugar readings. -Please follow-up in 3 months - metFORMIN (GLUCOPHAGE) 1000 MG tablet; Take 1 tablet (1,000 mg total) by mouth 2 (two) times daily.  Dispense: 180 tablet; Refill: 0  2. Essential hypertension -[place] blood pressure is better controlled at home after she takes her medication.  Advised to start monitoring blood pressure periodically at home - lisinopril (ZESTRIL) 10 MG tablet; Take 1 tablet (10 mg total) by mouth daily.  Dispense: 90 tablet; Refill: 3 - lisinopril (ZESTRIL) 5 MG tablet; Take 1 tablet (5 mg total) by mouth daily.  Dispense: 90 tablet; Refill: 3  3. Acute non-recurrent pansinusitis -We will treat due to timeframe and waxing and waning symptoms.  She was advised to follow-up if no improvement in the next 72 hours - doxycycline (VIBRAMYCIN) 100 MG capsule; Take 1 capsule (100 mg total) by mouth 2 (two) times daily.  Dispense: 14 capsule; Refill: 0  4. Medication refill  - glipiZIDE (GLUCOTROL XL) 10 MG 24 hr tablet; Take 1 tablet (10 mg total) by mouth daily with breakfast.  Dispense: 90 tablet; Refill: 0 - lisinopril (ZESTRIL) 10 MG tablet; Take 1 tablet (10 mg total) by mouth daily.  Dispense: 90 tablet; Refill: 3 - lisinopril (ZESTRIL) 5 MG tablet; Take 1 tablet (5 mg total) by mouth daily.  Dispense: 90 tablet; Refill: 3 - levothyroxine  (SYNTHROID) 88 MCG tablet; Take 1 tablet (88 mcg total) by mouth daily.  Dispense: 90 tablet; Refill: 3 - metFORMIN (GLUCOPHAGE) 1000 MG tablet; Take 1 tablet (1,000 mg total) by mouth 2 (two) times daily.  Dispense: 180 tablet; Refill: 0   Dorothyann Peng, NP

## 2019-04-10 ENCOUNTER — Other Ambulatory Visit: Payer: Self-pay | Admitting: Adult Health

## 2019-04-10 DIAGNOSIS — Z760 Encounter for issue of repeat prescription: Secondary | ICD-10-CM

## 2019-04-11 NOTE — Telephone Encounter (Signed)
DENIED.  FILLED ON 03/27/2019 FOR 90 DAYS.  REQUEST IS TOO EARLY.

## 2019-05-05 LAB — HM DIABETES EYE EXAM

## 2019-06-25 ENCOUNTER — Encounter: Payer: Self-pay | Admitting: Adult Health

## 2019-06-25 ENCOUNTER — Ambulatory Visit (INDEPENDENT_AMBULATORY_CARE_PROVIDER_SITE_OTHER): Payer: Managed Care, Other (non HMO) | Admitting: Adult Health

## 2019-06-25 ENCOUNTER — Other Ambulatory Visit: Payer: Self-pay

## 2019-06-25 VITALS — BP 140/100 | Temp 97.7°F | Wt 185.0 lb

## 2019-06-25 DIAGNOSIS — Z760 Encounter for issue of repeat prescription: Secondary | ICD-10-CM | POA: Diagnosis not present

## 2019-06-25 DIAGNOSIS — E139 Other specified diabetes mellitus without complications: Secondary | ICD-10-CM | POA: Diagnosis not present

## 2019-06-25 LAB — POCT GLYCOSYLATED HEMOGLOBIN (HGB A1C): HbA1c, POC (controlled diabetic range): 7.3 % — AB (ref 0.0–7.0)

## 2019-06-25 MED ORDER — GLIPIZIDE ER 10 MG PO TB24: 10.0000 mg | ORAL_TABLET | Freq: Every day | ORAL | 0 refills | Status: DC

## 2019-06-25 MED ORDER — GLIPIZIDE 5 MG PO TABS: 5.0000 mg | ORAL_TABLET | Freq: Every day | ORAL | 0 refills | Status: DC

## 2019-06-25 MED ORDER — METFORMIN HCL 1000 MG PO TABS: 1000.0000 mg | ORAL_TABLET | Freq: Two times a day (BID) | ORAL | 0 refills | Status: DC

## 2019-06-25 NOTE — Patient Instructions (Signed)
It was great seeing you today   Your A1c has improved from 8.0 to 7.3 - great job   Keep up the good work   I will see you in three months

## 2019-06-25 NOTE — Progress Notes (Signed)
Subjective:    Patient ID: Autumn Acevedo, female    DOB: February 20, 1961, 59 y.o.   MRN: [id number]  HPI 59 year old female who  has a past medical history of Allergy, Diabetes mellitus without complication ([place]), Goiter, Hypertension, Hypothyroid, and ITP (idiopathic thrombocytopenic purpura).  She presents to the office today for 10-month follow-up regarding diabetes mellitus.  She is currently prescribed glipizide 10 mg extended release and metformin 1000 mg twice daily.  She was last seen in December 2020 and at this time she was not exercising as she had in the past, she had continued on a low to no carb diet, was cooking at home and eating earlier in the evening.  She did note that her blood sugars were usually more elevated in the morning when she wakes up ( 170's) and slowly decreased as the day went on after she took her blood pressure medications.  She was advised to start taking glipizide 5 mg in the evening and to let me know via MyChart what her blood sugars had been after 2 to 3 weeks, she failed to do this.  Lab Results  Component Value Date   HGBA1C 8.0 (A) 03/27/2019   Today in the office she reports that since starting this regimen her blood sugars have been lower in the morning into the 130's-150's. She has cut out fried foods and is walking 2-3 miles x three days a week.   Wt Readings from Last 3 Encounters:  06/25/19 185 lb (83.9 kg)  03/27/19 187 lb (84.8 kg)  12/26/18 184 lb (83.5 kg)     Review of Systems See HPI   Past Medical History:  Diagnosis Date  . Allergy   . 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 Goiter   . Hypertension   . Hypothyroid   . ITP (idiopathic thrombocytopenic purpura)     Social History   Socioeconomic History  . Marital status: Single    Spouse name: Not on file  . Number of children: Not on file  . Years of education: Not on file  . Highest education level: Not on file  Occupational History  . Not on file   Tobacco Use  . Smoking status: Never Smoker  . Smokeless tobacco: Never Used  . Tobacco comment: never used tobacco  Substance and Sexual Activity  . Alcohol use: Yes    Alcohol/week: 1.0 standard drinks    Types: 1 Standard drinks or equivalent per week    Comment: 2-3 drinks a week   . Drug use: No  . Sexual activity: Not on file  Other Topics Concern  . 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 Has two grand babies   She enjoys reading and going to the gym. Hanging out with friends.       Social Determinants of Health   Financial Resource Strain:   . Difficulty of Paying Living Expenses: Not on file  Food Insecurity:   . Worried About Charity fundraiser in the Last Year: Not on file  . Ran Out of Food in the Last Year: Not on file  Transportation Needs:   . Lack of Transportation (Medical): Not on file  . Lack of Transportation (Non-Medical): Not on file  Physical Activity:   . Days of Exercise per Week: Not  on file  . Minutes of Exercise per Session: Not on file  Stress:   . Feeling of Stress : Not on file  Social Connections:   . Frequency of Communication with Friends and Family: Not on file  . Frequency of Social Gatherings with Friends and Family: Not on file  . Attends Religious Services: Not on file  . Active Member of Clubs or Organizations: Not on file  . Attends Archivist Meetings: Not on file  . Marital Status: Not on file  Intimate Partner Violence:   . Fear of Current or Ex-Partner: Not on file  . Emotionally Abused: Not on file  . Physically Abused: Not on file  . Sexually Abused: Not on file    Past Surgical History:  Procedure Laterality Date  . no surgeries at this time      Family History  Problem Relation Age of Onset  . Hypertension Mother        fhx  . COPD Father   . Lung cancer Father   . Pancreatic cancer Brother   .  Colon cancer Sister     No Known Allergies  Current Outpatient Medications on File Prior to Visit  Medication Sig Dispense Refill  . albuterol (PROVENTIL HFA;VENTOLIN HFA) 108 (90 Base) MCG/ACT inhaler Inhale 2 puffs into the lungs as needed for wheezing. 2 Inhaler 3  . BLACK COHOSH PO Take by mouth.    Marland Kitchen glipiZIDE (GLUCOTROL XL) 10 MG 24 hr tablet Take 1 tablet (10 mg total) by mouth daily with breakfast. 90 tablet 0  . glucose blood (ACCU-CHEK GUIDE) test strip USE TO TEST BLOOD GLUCOSE TWICE DAILY 200 each 3  . Lancets (ACCU-CHEK MULTICLIX) lancets USE TO TEST BLOOD GLUCOSE TWICE DAILY 200 each 3  . levothyroxine (SYNTHROID) 88 MCG tablet Take 1 tablet (88 mcg total) by mouth daily. 90 tablet 3  . lisinopril (ZESTRIL) 10 MG tablet Take 1 tablet (10 mg total) by mouth daily. 90 tablet 3  . lisinopril (ZESTRIL) 5 MG tablet Take 1 tablet (5 mg total) by mouth daily. 90 tablet 3  . metFORMIN (GLUCOPHAGE) 1000 MG tablet Take 1 tablet (1,000 mg total) by mouth 2 (two) times daily. 180 tablet 0  . Multiple Vitamin (MULTIVITAMIN WITH MINERALS) TABS Take 1 tablet by mouth daily.    . Turmeric (QC TUMERIC COMPLEX PO) Take by mouth.     No current facility-administered medications on file prior to visit.    BP (!) 140/100   Temp 97.7 F (36.5 C)   Wt 185 lb (83.9 kg)   LMP 05/17/2016   BMI 30.79 kg/m       Objective:   Physical Exam Vitals and nursing note reviewed.  Cardiovascular:     Rate and Rhythm: Normal rate and regular rhythm.     Pulses: Normal pulses.     Heart sounds: Normal heart sounds.  Pulmonary:     Effort: Pulmonary effort is normal.     Breath sounds: Normal breath sounds.  Musculoskeletal:        General: Normal range of motion.  Skin:    General: Skin is warm and dry.  Neurological:     General: No focal deficit present.     Mental Status: She is oriented to person, place, and time.  Psychiatric:        Mood and Affect: Mood normal.        Behavior:  Behavior normal.        Thought Content:  Thought content normal.        Judgment: Judgment normal.       Assessment & Plan:  1. Diabetes 1.5, managed as type 2 ([place])  - POCT A1C- 7.3 - has improved. Continue with current medication regimen - Continue to eat healthy and exercise - Follow up in 3 months or sooner if needed - glipiZIDE (GLUCOTROL) 5 MG tablet; Take 1 tablet (5 mg total) by mouth at bedtime.  Dispense: 90 tablet; Refill: 0 - glipiZIDE (GLUCOTROL XL) 10 MG 24 hr tablet; Take 1 tablet (10 mg total) by mouth daily with breakfast.  Dispense: 90 tablet; Refill: 0 - metFORMIN (GLUCOPHAGE) 1000 MG tablet; Take 1 tablet (1,000 mg total) by mouth 2 (two) times daily.  Dispense: 180 tablet; Refill: 0  Dorothyann Peng, NP

## 2019-07-31 ENCOUNTER — Other Ambulatory Visit: Payer: Self-pay | Admitting: Adult Health

## 2019-07-31 DIAGNOSIS — E139 Other specified diabetes mellitus without complications: Secondary | ICD-10-CM

## 2019-08-01 NOTE — Telephone Encounter (Signed)
SENT TO THE PHARMACY ON 06/25/2019 FOR 90 DAYS.  REQUEST IS TOO EARLY.

## 2019-08-03 ENCOUNTER — Encounter: Payer: Self-pay | Admitting: Adult Health

## 2019-08-03 DIAGNOSIS — J40 Bronchitis, not specified as acute or chronic: Secondary | ICD-10-CM

## 2019-08-03 DIAGNOSIS — Z760 Encounter for issue of repeat prescription: Secondary | ICD-10-CM

## 2019-08-05 MED ORDER — ALBUTEROL SULFATE HFA 108 (90 BASE) MCG/ACT IN AERS: 2.0000 | INHALATION_SPRAY | Freq: Four times a day (QID) | RESPIRATORY_TRACT | 0 refills | Status: DC | PRN

## 2019-08-28 ENCOUNTER — Other Ambulatory Visit: Payer: Self-pay | Admitting: Adult Health

## 2019-08-28 DIAGNOSIS — J40 Bronchitis, not specified as acute or chronic: Secondary | ICD-10-CM

## 2019-08-28 DIAGNOSIS — Z760 Encounter for issue of repeat prescription: Secondary | ICD-10-CM

## 2019-09-23 ENCOUNTER — Encounter: Payer: Self-pay | Admitting: Adult Health

## 2019-09-23 ENCOUNTER — Ambulatory Visit (INDEPENDENT_AMBULATORY_CARE_PROVIDER_SITE_OTHER): Payer: Managed Care, Other (non HMO) | Admitting: Adult Health

## 2019-09-23 ENCOUNTER — Other Ambulatory Visit: Payer: Self-pay

## 2019-09-23 VITALS — BP 140/84 | Temp 97.4°F | Wt 185.0 lb

## 2019-09-23 DIAGNOSIS — E139 Other specified diabetes mellitus without complications: Secondary | ICD-10-CM | POA: Diagnosis not present

## 2019-09-23 LAB — POCT GLYCOSYLATED HEMOGLOBIN (HGB A1C): HbA1c, POC (controlled diabetic range): 8.2 % — AB (ref 0.0–7.0)

## 2019-09-23 MED ORDER — GLIPIZIDE ER 10 MG PO TB24: 10.0000 mg | ORAL_TABLET | Freq: Two times a day (BID) | ORAL | 0 refills | Status: DC

## 2019-09-23 NOTE — Patient Instructions (Addendum)
Your A1c has increased to 8.2 from 7.3   I have increased your glipizide to 10 mg XR twice a day   Please follow up in 3 months for your physical ( after 12/26/2019)

## 2019-09-23 NOTE — Progress Notes (Signed)
Subjective:    Patient ID: Autumn Acevedo, female    DOB: 20-Jul-1960, 59 y.o.   MRN: [id number]  HPI She presents to the office today for 41-month follow-up regarding diabetes mellitus.  He is currently prescribed glipizide 10 mg extended release in the day/ 5 mg XR in the evening and Metformin 1000 mg twice daily.  She has not been exercising .She has not been checking her blood sugars at home.   Lab Results  Component Value Date   HGBA1C 7.3 (A) 06/25/2019   Wt Readings from Last 3 Encounters:  09/23/19 185 lb (83.9 kg)  06/25/19 185 lb (83.9 kg)  03/27/19 187 lb (84.8 kg)   Review of Systems   See HPI    Past Medical History:  Diagnosis Date  . Allergy   . Diabetes mellitus without complication ([place])    Type2   . Goiter   . Hypertension   . Hypothyroid   . ITP (idiopathic thrombocytopenic purpura)     Social History   Socioeconomic History  . Marital status: Single    Spouse name: Not on file  . Number of children: Not on file  . Years of education: Not on file  . Highest education level: Not on file  Occupational History  . Not on file  Tobacco Use  . Smoking status: Never Smoker  . Smokeless tobacco: Never Used  . Tobacco comment: never used tobacco  Substance and Sexual Activity  . Alcohol use: Yes    Alcohol/week: 1.0 standard drinks    Types: 1 Standard drinks or equivalent per week    Comment: 2-3 drinks a week   . Drug use: No  . Sexual activity: Not on file  Other Topics Concern  . 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 Has two grand babies   She enjoys reading and going to the gym. Hanging out with friends.       Social Determinants of Health   Financial Resource Strain:   . Difficulty of Paying Living Expenses:   Food Insecurity:   . Worried About Charity fundraiser in the Last Year:   . Arboriculturist in the Last Year:    Transportation Needs:   . Film/video editor (Medical):   Marland Kitchen Lack of Transportation (Non-Medical):   Physical Activity:   . Days of Exercise per Week:   . Minutes of Exercise per Session:   Stress:   . Feeling of Stress :   Social Connections:   . Frequency of Communication with Friends and Family:   . Frequency of Social Gatherings with Friends and Family:   . Attends Religious Services:   . Active Member of Clubs or Organizations:   . Attends Archivist Meetings:   Marland Kitchen Marital Status:   Intimate Partner Violence:   . Fear of Current or Ex-Partner:   . Emotionally Abused:   Marland Kitchen Physically Abused:   . Sexually Abused:     Past Surgical History:  Procedure Laterality Date  . no surgeries at this time      Family History  Problem Relation Age of Onset  . Hypertension Mother        fhx  . COPD Father   . Lung cancer Father   . Pancreatic cancer Brother   . Colon cancer Sister  No Known Allergies  Current Outpatient Medications on File Prior to Visit  Medication Sig Dispense Refill  . albuterol (VENTOLIN HFA) 108 (90 Base) MCG/ACT inhaler Inhale 2 puffs into the lungs every 6 (six) hours as needed for wheezing. 18 g 0  . BLACK COHOSH PO Take by mouth.    Marland Kitchen glipiZIDE (GLUCOTROL XL) 10 MG 24 hr tablet Take 1 tablet (10 mg total) by mouth daily with breakfast. 90 tablet 0  . glipiZIDE (GLUCOTROL) 5 MG tablet Take 1 tablet (5 mg total) by mouth at bedtime. 90 tablet 0  . glucose blood (ACCU-CHEK GUIDE) test strip USE TO TEST BLOOD GLUCOSE TWICE DAILY 200 each 3  . Lancets (ACCU-CHEK MULTICLIX) lancets USE TO TEST BLOOD GLUCOSE TWICE DAILY 200 each 3  . levothyroxine (SYNTHROID) 88 MCG tablet Take 1 tablet (88 mcg total) by mouth daily. 90 tablet 3  . lisinopril (ZESTRIL) 10 MG tablet Take 1 tablet (10 mg total) by mouth daily. 90 tablet 3  . lisinopril (ZESTRIL) 5 MG tablet Take 1 tablet (5 mg total) by mouth daily. 90 tablet 3  . metFORMIN (GLUCOPHAGE) 1000 MG  tablet Take 1 tablet (1,000 mg total) by mouth 2 (two) times daily. 180 tablet 0  . Multiple Vitamin (MULTIVITAMIN WITH MINERALS) TABS Take 1 tablet by mouth daily.    Marland Kitchen OVER THE COUNTER MEDICATION Vitamin A, B12, C and D    . Turmeric (QC TUMERIC COMPLEX PO) Take by mouth.     No current facility-administered medications on file prior to visit.    LMP 05/17/2016       Objective:   Physical Exam Vitals and nursing note reviewed.  Constitutional:      Appearance: Normal appearance.  Cardiovascular:     Rate and Rhythm: Normal rate and regular rhythm.     Pulses: Normal pulses.     Heart sounds: Normal heart sounds.  Pulmonary:     Effort: Pulmonary effort is normal.     Breath sounds: Normal breath sounds.  Skin:    General: Skin is warm and dry.  Neurological:     General: No focal deficit present.     Mental Status: She is oriented to person, place, and time.  Psychiatric:        Mood and Affect: Mood normal.        Behavior: Behavior normal.        Thought Content: Thought content normal.        Judgment: Judgment normal.       Assessment & Plan:  1. Diabetes 1.5, managed as type 2 ([place])  - POCT A1C- 8.2 - not at goal and has increased. Will increase Glipizide to 10 mg BID.  - glipiZIDE (GLUCOTROL XL) 10 MG 24 hr tablet; Take 1 tablet (10 mg total) by mouth in the morning and at bedtime.  Dispense: 180 tablet; Refill: 0 - Follow up in 90 days for CPE   Dorothyann Peng, NP

## 2019-12-01 ENCOUNTER — Other Ambulatory Visit: Payer: Self-pay | Admitting: Adult Health

## 2019-12-01 DIAGNOSIS — E139 Other specified diabetes mellitus without complications: Secondary | ICD-10-CM

## 2019-12-02 NOTE — Telephone Encounter (Signed)
Sent to the pharmacy by e-scribe for 90 days.  Pt has upcoming cpx on 12/31/19.

## 2019-12-15 ENCOUNTER — Other Ambulatory Visit: Payer: Self-pay | Admitting: Adult Health

## 2019-12-15 DIAGNOSIS — E139 Other specified diabetes mellitus without complications: Secondary | ICD-10-CM

## 2019-12-28 ENCOUNTER — Other Ambulatory Visit: Payer: Self-pay | Admitting: Adult Health

## 2019-12-28 DIAGNOSIS — I10 Essential (primary) hypertension: Secondary | ICD-10-CM

## 2019-12-28 DIAGNOSIS — Z760 Encounter for issue of repeat prescription: Secondary | ICD-10-CM

## 2019-12-31 ENCOUNTER — Other Ambulatory Visit: Payer: Self-pay

## 2019-12-31 ENCOUNTER — Ambulatory Visit (INDEPENDENT_AMBULATORY_CARE_PROVIDER_SITE_OTHER): Payer: Managed Care, Other (non HMO) | Admitting: Adult Health

## 2019-12-31 ENCOUNTER — Encounter: Payer: Self-pay | Admitting: Adult Health

## 2019-12-31 ENCOUNTER — Other Ambulatory Visit: Payer: Self-pay | Admitting: Adult Health

## 2019-12-31 VITALS — BP 138/90 | HR 78 | Temp 98.1°F | Ht 66.0 in | Wt 181.6 lb

## 2019-12-31 DIAGNOSIS — E139 Other specified diabetes mellitus without complications: Secondary | ICD-10-CM

## 2019-12-31 DIAGNOSIS — Z0000 Encounter for general adult medical examination without abnormal findings: Secondary | ICD-10-CM | POA: Diagnosis not present

## 2019-12-31 DIAGNOSIS — E039 Hypothyroidism, unspecified: Secondary | ICD-10-CM

## 2019-12-31 DIAGNOSIS — I10 Essential (primary) hypertension: Secondary | ICD-10-CM

## 2019-12-31 MED ORDER — LISINOPRIL 20 MG PO TABS: 20.0000 mg | ORAL_TABLET | Freq: Every day | ORAL | 3 refills | Status: DC

## 2019-12-31 NOTE — Progress Notes (Signed)
Subjective:    Patient ID: Autumn Acevedo, female    DOB: 02-13-1961, 59 y.o.   MRN: [id number]  HPI Patient presents for yearly preventative medicine examination. She is a pleasant 59 year old female who  has a past medical history of Allergy, Diabetes mellitus without complication ([place]), Goiter, Hypertension, Hypothyroid, and ITP (idiopathic thrombocytopenic purpura).  DM -she is currently prescribed glipizide 10 mg extended release twice daily and Metformin 1000 mg twice daily.  Her last A1c in June 2021 was 8.2, this was an increase from 7.3-3 months prior.  At this time we increase glipizide to 10 mg extended release and she was courage to work on lifestyle modifications. She reports that she is eating healthier and is exercising more and is walking  Hypothyroidism -currently prescribed Synthroid 88 mcg daily. She had a [place] of thyroid with subsequent biopsy in 01/2019   Hypertension-takes lisinopril 15 mg daily. She denies dizziness, lightheadedness, chest pain, or shortness of breath. BP Readings from Last 3 Encounters:  12/31/19 138/90  09/23/19 140/84  06/25/19 (!) 140/100   All immunizations and health maintenance protocols were reviewed with the patient and needed orders were placed.  Appropriate screening laboratory values were ordered for the patient including screening of hyperlipidemia, renal function and hepatic function.  Medication reconciliation,  past medical history, social history, problem list and allergies were reviewed in detail with the patient  Goals were established with regard to weight loss, exercise, and  diet in compliance with medications Wt Readings from Last 3 Encounters:  12/31/19 181 lb 9.6 oz (82.4 kg)  09/23/19 185 lb (83.9 kg)  06/25/19 185 lb (83.9 kg)   She is up-to-date on routine screening mammogram and colon cancer screening.  She is going to be seen by her GYN for her Pap smear.  She has no acute issues    Review of Systems   Constitutional: Negative.   HENT: Negative.   Eyes: Negative.   Respiratory: Negative.   Cardiovascular: Negative.   Gastrointestinal: Negative.   Endocrine: Negative.   Genitourinary: Negative.   Musculoskeletal: Negative.   Skin: Negative.   Allergic/Immunologic: Negative.   Neurological: Negative.   Hematological: Negative.   Psychiatric/Behavioral: Negative.    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    Goiter    Hypertension    Hypothyroid    ITP (idiopathic thrombocytopenic purpura)     Social History   Socioeconomic History   Marital status: Single    Spouse name: Not on file   Number of children: Not on file   Years of education: Not on file   Highest education level: Not on file  Occupational History   Not on file  Tobacco Use   Smoking status: Never Smoker   Smokeless tobacco: Never Used   Tobacco comment: never used tobacco  Vaping Use   Vaping Use: Never used  Substance and Sexual Activity   Alcohol use: Yes    Alcohol/week: 1.0 standard drink    Types: 1 Standard drinks or equivalent per week    Comment: 2-3 drinks a week    Drug use: No   Sexual activity: Not on file  Other Topics Concern   Not on file  Social History Narrative   Works for an IT consultant    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 Has two grand babies  She enjoys reading and going to the gym. Hanging out with friends.       Social Determinants of Health   Financial Resource Strain:    Difficulty of Paying Living Expenses: Not on file  Food Insecurity:    Worried About Charity fundraiser in the Last Year: Not on file   [employer] of Food in the Last Year: Not on file  Transportation Needs:    Lack of Transportation (Medical): Not on file   Lack of Transportation (Non-Medical): Not on file  Physical Activity:    Days of Exercise per Week: Not on file    Minutes of Exercise per Session: Not on file  Stress:    Feeling of Stress : Not on file  Social Connections:    Frequency of Communication with Friends and Family: Not on file   Frequency of Social Gatherings with Friends and Family: Not on file   Attends Religious Services: Not on file   Active Member of Clubs or Organizations: Not on file   Attends Archivist Meetings: Not on file   Marital Status: Not on file  Intimate Partner Violence:    Fear of Current or Ex-Partner: Not on file   Emotionally Abused: Not on file   Physically Abused: Not on file   Sexually Abused: Not on file    Past Surgical History:  Procedure Laterality Date   no surgeries at this time      Family History  Problem Relation Age of Onset   Hypertension Mother        fhx   COPD Father    Lung cancer Father    Pancreatic cancer Brother    Colon cancer Sister     No Known Allergies  Current Outpatient Medications on File Prior to Visit  Medication Sig Dispense Refill   albuterol (VENTOLIN HFA) 108 (90 Base) MCG/ACT inhaler Inhale 2 puffs into the lungs every 6 (six) hours as needed for wheezing. 18 g 0   BLACK COHOSH PO Take by mouth.     glipiZIDE (GLUCOTROL XL) 10 MG 24 hr tablet TAKE 1 TABLET (10 MG TOTAL) BY MOUTH IN THE MORNING AND AT BEDTIME. 180 tablet 1   glucose blood (ACCU-CHEK GUIDE) test strip USE TO TEST BLOOD GLUCOSE TWICE DAILY 200 each 3   Lancets (ACCU-CHEK MULTICLIX) lancets USE TO TEST BLOOD GLUCOSE TWICE DAILY 200 each 3   levothyroxine (SYNTHROID) 88 MCG tablet Take 1 tablet (88 mcg total) by mouth daily. 90 tablet 3   metFORMIN (GLUCOPHAGE) 1000 MG tablet TAKE 1 TABLET BY MOUTH TWICE A DAY 180 tablet 0   Multiple Vitamin (MULTIVITAMIN WITH MINERALS) TABS Take 1 tablet by mouth daily.     OVER THE COUNTER MEDICATION Vitamin A, B12, C and D     Turmeric (QC TUMERIC COMPLEX PO) Take by mouth.     No current facility-administered medications  on file prior to visit.    BP 138/90 (BP Location: Left Arm, Patient Position: Sitting, Cuff Size: Normal)    Pulse 78    Temp 98.1 F (36.7 C) (Oral)    Ht $R'5\' 6"'Ar$  (1.676 m)    Wt 181 lb 9.6 oz (82.4 kg)    LMP 05/17/2016    SpO2 100%    BMI 29.31 kg/m       Objective:   Physical Exam Vitals and nursing note reviewed.  Constitutional:      General: She is not in acute distress.  Appearance: Normal appearance. She is well-developed. She is not ill-appearing.  HENT:     Head: Normocephalic and atraumatic.     Right Ear: Tympanic membrane, ear canal and external ear normal. There is no impacted cerumen.     Left Ear: Tympanic membrane, ear canal and external ear normal. There is no impacted cerumen.     Nose: Nose normal. No congestion or rhinorrhea.     Mouth/Throat:     Mouth: Mucous membranes are moist.     Pharynx: Oropharynx is clear. No oropharyngeal exudate or posterior oropharyngeal erythema.  Eyes:     General:        Right eye: No discharge.        Left eye: No discharge.     Extraocular Movements: Extraocular movements intact.     Conjunctiva/sclera: Conjunctivae normal.     Pupils: Pupils are equal, round, and reactive to light.  Neck:     Thyroid: No thyromegaly.     Vascular: No carotid bruit.     Trachea: No tracheal deviation.     Comments: + goiter  Cardiovascular:     Rate and Rhythm: Normal rate and regular rhythm.     Pulses: Normal pulses.     Heart sounds: Normal heart sounds. No murmur heard.  No friction rub. No gallop.   Pulmonary:     Effort: Pulmonary effort is normal. No respiratory distress.     Breath sounds: Normal breath sounds. No stridor. No wheezing, rhonchi or rales.  Chest:     Chest wall: No tenderness.  Abdominal:     General: Abdomen is flat. Bowel sounds are normal. There is no distension.     Palpations: Abdomen is soft. There is no mass.     Tenderness: There is no abdominal tenderness. There is no right CVA tenderness, left  CVA tenderness, guarding or rebound.     Hernia: No hernia is present.  Musculoskeletal:        General: No swelling, tenderness, deformity or signs of injury. Normal range of motion.     Cervical back: Normal range of motion and neck supple.     Right lower leg: No edema.     Left lower leg: No edema.  Lymphadenopathy:     Cervical: No cervical adenopathy.  Skin:    General: Skin is warm and dry.     Coloration: Skin is not jaundiced or pale.     Findings: No bruising, erythema, lesion or rash.  Neurological:     General: No focal deficit present.     Mental Status: She is alert and oriented to person, place, and time.     Cranial Nerves: No cranial nerve deficit.     Sensory: No sensory deficit.     Motor: No weakness.     Coordination: Coordination normal.     Gait: Gait normal.     Deep Tendon Reflexes: Reflexes normal.  Psychiatric:        Mood and Affect: Mood normal.        Behavior: Behavior normal.        Thought Content: Thought content normal.        Judgment: Judgment normal.        Assessment & Plan:  1. Routine general medical examination at a health care facility - Continue to eat healthy and exercise - Follow up in one year or sooner if needed  - CBC with Differential/Platelet; Future - Hemoglobin A1c; Future - Lipid panel; Future -  TSH; Future - CMP with eGFR([facility]); Future  2. Essential hypertension - Will increase lisinopril to 20 mg for tighter BP control  - CBC with Differential/Platelet; Future - Hemoglobin A1c; Future - Lipid panel; Future - TSH; Future - CMP with eGFR([facility]); Future - lisinopril (ZESTRIL) 20 MG tablet; Take 1 tablet (20 mg total) by mouth daily.  Dispense: 90 tablet; Refill: 3  3. Diabetes 1.5, managed as type 2 ([place]) - Consider adding Trulicity  - Follow up in 3 months  - CBC with Differential/Platelet; Future - Hemoglobin A1c; Future - Lipid panel; Future - TSH; Future - CMP with eGFR([facility]); Future  4. Acquired  hypothyroidism - Consider synthroid replacement  - CBC with Differential/Platelet; Future - Hemoglobin A1c; Future - Lipid panel; Future - TSH; Future - CMP with eGFR([facility]); Future   Dorothyann Peng, NP

## 2020-01-01 LAB — CBC WITH DIFFERENTIAL/PLATELET
Absolute Monocytes: 428 cells/uL (ref 200–950)
Basophils Absolute: 31 cells/uL (ref 0–200)
Basophils Relative: 0.6 %
Eosinophils Absolute: 71 cells/uL (ref 15–500)
Eosinophils Relative: 1.4 %
HCT: 43.2 % (ref 35.0–45.0)
Hemoglobin: 13.3 g/dL (ref 11.7–15.5)
Lymphs Abs: 2295 cells/uL (ref 850–3900)
MCH: 23.4 pg — ABNORMAL LOW (ref 27.0–33.0)
MCHC: 30.8 g/dL — ABNORMAL LOW (ref 32.0–36.0)
MCV: 75.9 fL — ABNORMAL LOW (ref 80.0–100.0)
MPV: 11 fL (ref 7.5–12.5)
Monocytes Relative: 8.4 %
Neutro Abs: 2275 cells/uL (ref 1500–7800)
Neutrophils Relative %: 44.6 %
Platelets: 269 10*3/uL (ref 140–400)
RBC: 5.69 10*6/uL — ABNORMAL HIGH (ref 3.80–5.10)
RDW: 14.5 % (ref 11.0–15.0)
Total Lymphocyte: 45 %
WBC: 5.1 10*3/uL (ref 3.8–10.8)

## 2020-01-01 LAB — LIPID PANEL
Cholesterol: 156 mg/dL (ref <200)
HDL: 43 mg/dL — ABNORMAL LOW (ref >=50)
LDL Cholesterol (Calc): 88 mg/dL (calc)
Non-HDL Cholesterol (Calc): 113 mg/dL (calc) (ref <130)
Total CHOL/HDL Ratio: 3.6 (calc) (ref <5.0)
Triglycerides: 146 mg/dL (ref <150)

## 2020-01-01 LAB — COMPLETE METABOLIC PANEL WITH GFR
AG Ratio: 1.8 (calc) (ref 1.0–2.5)
ALT: 24 U/L (ref 6–29)
AST: 17 U/L (ref 10–35)
Albumin: 4.6 g/dL (ref 3.6–5.1)
Alkaline phosphatase (APISO): 75 U/L (ref 37–153)
BUN: 16 mg/dL (ref 7–25)
CO2: 28 mmol/L (ref 20–32)
Calcium: 9.9 mg/dL (ref 8.6–10.4)
Chloride: 104 mmol/L (ref 98–110)
Creat: 0.95 mg/dL (ref 0.50–1.05)
GFR, Est African American: 77 mL/min/{1.73_m2} (ref >=60)
GFR, Est Non African American: 66 mL/min/{1.73_m2} (ref >=60)
Globulin: 2.5 g/dL (calc) (ref 1.9–3.7)
Glucose, Bld: 148 mg/dL — ABNORMAL HIGH (ref 65–99)
Potassium: 5.6 mmol/L — ABNORMAL HIGH (ref 3.5–5.3)
Sodium: 141 mmol/L (ref 135–146)
Total Bilirubin: 0.5 mg/dL (ref 0.2–1.2)
Total Protein: 7.1 g/dL (ref 6.1–8.1)

## 2020-01-01 LAB — TSH: TSH: 0.03 mIU/L — ABNORMAL LOW (ref 0.40–4.50)

## 2020-01-01 LAB — HEMOGLOBIN A1C
Hgb A1c MFr Bld: 7.6 % of total Hgb — ABNORMAL HIGH (ref <5.7)
Mean Plasma Glucose: 171 (calc)
eAG (mmol/L): 9.5 (calc)

## 2020-01-06 ENCOUNTER — Other Ambulatory Visit: Payer: Self-pay | Admitting: Adult Health

## 2020-01-06 DIAGNOSIS — Z760 Encounter for issue of repeat prescription: Secondary | ICD-10-CM

## 2020-01-06 MED ORDER — LEVOTHYROXINE SODIUM 75 MCG PO TABS: 75.0000 ug | ORAL_TABLET | Freq: Every day | ORAL | 0 refills | Status: DC

## 2020-01-21 ENCOUNTER — Other Ambulatory Visit: Payer: Self-pay | Admitting: Adult Health

## 2020-01-21 DIAGNOSIS — E139 Other specified diabetes mellitus without complications: Secondary | ICD-10-CM

## 2020-02-02 ENCOUNTER — Other Ambulatory Visit: Payer: Self-pay | Admitting: Adult Health

## 2020-02-02 DIAGNOSIS — Z1231 Encounter for screening mammogram for malignant neoplasm of breast: Secondary | ICD-10-CM

## 2020-03-01 ENCOUNTER — Ambulatory Visit
Admission: RE | Admit: 2020-03-01 | Discharge: 2020-03-01 | Disposition: A | Discharge status: Home / Self Care | Payer: Managed Care, Other (non HMO) | Source: Ambulatory Visit | Attending: Adult Health | Admitting: Adult Health

## 2020-03-01 ENCOUNTER — Other Ambulatory Visit: Payer: Self-pay

## 2020-03-01 DIAGNOSIS — Z1231 Encounter for screening mammogram for malignant neoplasm of breast: Secondary | ICD-10-CM

## 2020-03-29 ENCOUNTER — Other Ambulatory Visit: Payer: Self-pay | Admitting: Adult Health

## 2020-03-29 DIAGNOSIS — Z760 Encounter for issue of repeat prescription: Secondary | ICD-10-CM

## 2020-03-30 ENCOUNTER — Encounter: Payer: Self-pay | Admitting: Adult Health

## 2020-03-30 ENCOUNTER — Ambulatory Visit (INDEPENDENT_AMBULATORY_CARE_PROVIDER_SITE_OTHER): Payer: Managed Care, Other (non HMO) | Admitting: Adult Health

## 2020-03-30 ENCOUNTER — Other Ambulatory Visit: Payer: Self-pay

## 2020-03-30 VITALS — BP 130/88 | HR 89 | Temp 98.6°F | Ht 66.0 in | Wt 187.2 lb

## 2020-03-30 DIAGNOSIS — E139 Other specified diabetes mellitus without complications: Secondary | ICD-10-CM | POA: Diagnosis not present

## 2020-03-30 LAB — POCT GLYCOSYLATED HEMOGLOBIN (HGB A1C): Hemoglobin A1C: 7.8 % — AB (ref 4.0–5.6)

## 2020-03-30 MED ORDER — TRULICITY 0.75 MG/0.5ML ~~LOC~~ SOAJ: 0.7500 mg | SUBCUTANEOUS | 1 refills | Status: DC

## 2020-03-30 MED ORDER — FREESTYLE LIBRE 2 SENSOR MISC: 3 refills | Status: DC

## 2020-03-30 NOTE — Progress Notes (Signed)
Subjective:    Patient ID: Autumn Acevedo, female    DOB: [birth year], 59 y.o.   MRN: [id number]  HPI 59 year old female who  has a past medical history of Allergy, Diabetes mellitus without complication ([place]), Goiter, Hypertension, Hypothyroid, and ITP (idiopathic thrombocytopenic purpura).  She presents to the office today for follow-up regarding diabetes mellitus.  She is currently prescribed glipizide 10 mg extended release twice daily and Metformin 1000 mg twice daily.  She does try and eat healthy and exercise but this continues to be a lacking issue in her life.  She does monitor her blood sugars periodically with readings in the upwards of 1 50-2 100s.  In the past she has been reluctant to add any medication to her regimen and would have rather work on lifestyle modifications.  Her last A1c 3 months ago was 7.6   Review of Systems See HPI   Past Medical History:  Diagnosis Date  . Allergy   . Diabetes mellitus without complication ([place])    Type2   . Goiter   . Hypertension   . Hypothyroid   . ITP (idiopathic thrombocytopenic purpura)     Social History   Socioeconomic History  . Marital status: Single    Spouse name: Not on file  . Number of children: Not on file  . Years of education: Not on file  . Highest education level: Not on file  Occupational History  . Not on file  Tobacco Use  . Smoking status: Never Smoker  . Smokeless tobacco: Never Used  . Tobacco comment: never used tobacco  Vaping Use  . Vaping Use: Never used  Substance and Sexual Activity  . Alcohol use: Yes    Alcohol/week: 1.0 standard drink    Types: 1 Standard drinks or equivalent per week    Comment: 2-3 drinks a week   . Drug use: No  . Sexual activity: Not on file  Other Topics Concern  . 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 Has two grand babies   She enjoys reading and  going to the gym. Hanging out with friends.       Social Determinants of Health   Financial Resource Strain:   . Difficulty of Paying Living Expenses: Not on file  Food Insecurity:   . Worried About Charity fundraiser in the Last Year: Not on file  . Ran Out of Food in the Last Year: Not on file  Transportation Needs:   . Lack of Transportation (Medical): Not on file  . Lack of Transportation (Non-Medical): Not on file  Physical Activity:   . Days of Exercise per Week: Not on file  . Minutes of Exercise per Session: Not on file  Stress:   . Feeling of Stress : Not on file  Social Connections:   . Frequency of Communication with Friends and Family: Not on file  . Frequency of Social Gatherings with Friends and Family: Not on file  . Attends Religious Services: Not on file  . Active Member of Clubs or Organizations: Not on file  . Attends Archivist Meetings: Not on file  . Marital Status: Not on file  Intimate Partner Violence:   . Fear of Current or Ex-Partner: Not on file  . Emotionally Abused: Not on file  . Physically Abused: Not on  file  . Sexually Abused: Not on file    Past Surgical History:  Procedure Laterality Date  . no surgeries at this time      Family History  Problem Relation Age of Onset  . Hypertension Mother        fhx  . COPD Father   . Lung cancer Father   . Pancreatic cancer Brother   . Colon cancer Sister     No Known Allergies  Current Outpatient Medications on File Prior to Visit  Medication Sig Dispense Refill  . albuterol (VENTOLIN HFA) 108 (90 Base) MCG/ACT inhaler Inhale 2 puffs into the lungs every 6 (six) hours as needed for wheezing. 18 g 0  . BLACK COHOSH PO Take by mouth.    Marland Kitchen glucose blood (ACCU-CHEK GUIDE) test strip USE TO TEST BLOOD GLUCOSE TWICE DAILY 200 each 3  . Lancets (ACCU-CHEK MULTICLIX) lancets USE TO TEST BLOOD GLUCOSE TWICE DAILY 200 each 3  . lisinopril (ZESTRIL) 20 MG tablet Take 1 tablet (20 mg total)  by mouth daily. 90 tablet 3  . metFORMIN (GLUCOPHAGE) 1000 MG tablet TAKE 1 TABLET BY MOUTH TWICE A DAY 180 tablet 0  . Multiple Vitamin (MULTIVITAMIN WITH MINERALS) TABS Take 1 tablet by mouth daily.    Marland Kitchen OVER THE COUNTER MEDICATION Vitamin A, B12, C and D    . Turmeric (QC TUMERIC COMPLEX PO) Take by mouth.    Marland Kitchen glipiZIDE (GLUCOTROL XL) 10 MG 24 hr tablet TAKE 1 TABLET (10 MG TOTAL) BY MOUTH IN THE MORNING AND AT BEDTIME. 180 tablet 1  . levothyroxine (SYNTHROID) 75 MCG tablet TAKE 1 TABLET BY MOUTH EVERY DAY 90 tablet 0   No current facility-administered medications on file prior to visit.    BP 130/88 (BP Location: Left Arm, Patient Position: Sitting, Cuff Size: Large)   Pulse 89   Temp 98.6 F (37 C) (Oral)   Ht 5\' 6"  (1.676 m)   Wt 187 lb 3.2 oz (84.9 kg)   LMP 05/17/2016   BMI 30.21 kg/m       Objective:   Physical Exam Vitals and nursing note reviewed.  Constitutional:      Appearance: Normal appearance.  Musculoskeletal:        General: Normal range of motion.  Skin:    General: Skin is warm and dry.     Capillary Refill: Capillary refill takes less than 2 seconds.  Neurological:     General: No focal deficit present.     Mental Status: She is alert and oriented to person, place, and time.  Psychiatric:        Mood and Affect: Mood normal.        Behavior: Behavior normal.        Thought Content: Thought content normal.        Judgment: Judgment normal.       Assessment & Plan:  1. Diabetes 1.5, managed as type 2 ([place])  - POC HgB A1c- 7.8 - not at goal.  -Her A1c has not improved despite continuous efforts of lifestyle modification.  I am going to start her on Trulicity 2.33 mg weekly, we reviewed the instructions on how to use the Trulicity pen and she was able to give her first dose to herself in the office.  I also believe that she will do better with a continuous glucose monitor as she does not check her blood sugars as often as we would like.  She was  given a sample  14-day libre to sensor to trial.  She was also able to apply this on her own. - Dulaglutide (TRULICITY) 4.37 DH/7.8XB SOPN; Inject 0.75 mg into the skin once a week.  Dispense: 6 mL; Refill: 1 - Continuous Blood Gluc Sensor (FREESTYLE LIBRE 2 SENSOR) MISC; Use with [employer] app  Dispense: 6 each; Refill: 3 - Follow up in three months   Dorothyann Peng, NP  Time spent on chart review, time with patient; question of diabetes, instructions on how to properly apply the [place] system as well as use Trulicity pen, diet, exercise, follow up plan, and documentation 35 minutes

## 2020-03-31 ENCOUNTER — Encounter: Payer: Self-pay | Admitting: Adult Health

## 2020-04-02 ENCOUNTER — Other Ambulatory Visit: Payer: Self-pay | Admitting: Adult Health

## 2020-04-02 DIAGNOSIS — Z760 Encounter for issue of repeat prescription: Secondary | ICD-10-CM

## 2020-04-02 DIAGNOSIS — E139 Other specified diabetes mellitus without complications: Secondary | ICD-10-CM

## 2020-04-02 MED ORDER — EMPAGLIFLOZIN 10 MG PO TABS: 10.0000 mg | ORAL_TABLET | Freq: Every day | ORAL | 0 refills | Status: DC

## 2020-05-03 ENCOUNTER — Other Ambulatory Visit: Payer: Self-pay | Admitting: Adult Health

## 2020-05-03 DIAGNOSIS — Z760 Encounter for issue of repeat prescription: Secondary | ICD-10-CM

## 2020-05-03 DIAGNOSIS — E139 Other specified diabetes mellitus without complications: Secondary | ICD-10-CM

## 2020-05-05 NOTE — Telephone Encounter (Signed)
Sent to the pharmacy by e-scribe.  Pt has an upcoming appt on 07/06/20.

## 2020-05-07 ENCOUNTER — Other Ambulatory Visit: Payer: Self-pay | Admitting: Adult Health

## 2020-05-07 DIAGNOSIS — J40 Bronchitis, not specified as acute or chronic: Secondary | ICD-10-CM

## 2020-05-07 DIAGNOSIS — Z760 Encounter for issue of repeat prescription: Secondary | ICD-10-CM

## 2020-05-07 NOTE — Telephone Encounter (Signed)
Sent to the pharmacy by e-scribe. 

## 2020-07-05 ENCOUNTER — Other Ambulatory Visit: Payer: Self-pay

## 2020-07-06 ENCOUNTER — Encounter: Payer: Self-pay | Admitting: Adult Health

## 2020-07-06 ENCOUNTER — Ambulatory Visit (INDEPENDENT_AMBULATORY_CARE_PROVIDER_SITE_OTHER): Payer: Managed Care, Other (non HMO) | Admitting: Adult Health

## 2020-07-06 VITALS — BP 140/102 | HR 97 | Temp 98.3°F | Wt 178.6 lb

## 2020-07-06 DIAGNOSIS — E139 Other specified diabetes mellitus without complications: Secondary | ICD-10-CM

## 2020-07-06 LAB — POCT GLYCOSYLATED HEMOGLOBIN (HGB A1C): Hemoglobin A1C: 7 % — AB (ref 4.0–5.6)

## 2020-07-06 MED ORDER — DAPAGLIFLOZIN PROPANEDIOL 10 MG PO TABS: 10.0000 mg | ORAL_TABLET | Freq: Every day | ORAL | 0 refills | Status: DC

## 2020-07-06 NOTE — Progress Notes (Signed)
Subjective:    Patient ID: Autumn Acevedo, female    DOB: [birth year], 60 y.o.   MRN: [id number]  HPI 60 year old female who  has a past medical history of Allergy, Diabetes mellitus without complication ([place]), Goiter, Hypertension, Hypothyroid, and ITP (idiopathic thrombocytopenic purpura).  She presents to the office today for three month follow up regarding DM.  She is currently prescribed glipizide 10 mg ER twice daily, Jardiance 10 mg Metformin 1000 mg twice daily.  She does monitor her blood sugars at home and since adding Jardiance her BS have been " less than 120".  In the past she has been reluctant to add any medication to her regimen and would rather of work on lifestyle modifications.  During her last visit in [place] her A1c had worsened from 7.6-7.8.  Despite continuous efforts and lifestyle modification her A1c continued to increase slightly.  She was started on Trulicity 5.00 mg weekly, when she got to the pharmacy the cost of the medication was $1000.  She was then placed on Jardiance 10 mg mood was able to get a co-pay card for this. Unfortunately, when she went to refill her Vania Rea is was going to cost her $800.   She has been eating out less and trying to exercise more.   Lab Results  Component Value Date   HGBA1C 7.8 (A) 03/30/2020    Wt Readings from Last 3 Encounters:  07/06/20 178 lb 9.6 oz (81 kg)  03/30/20 187 lb 3.2 oz (84.9 kg)  12/31/19 181 lb 9.6 oz (82.4 kg)    Review of Systems See HPI   Past Medical History:  Diagnosis Date  . Allergy   . Diabetes mellitus without complication ([place])    Type2   . Goiter   . Hypertension   . Hypothyroid   . ITP (idiopathic thrombocytopenic purpura)     Social History   Socioeconomic History  . Marital status: Single    Spouse name: Not on file  . Number of children: Not on file  . Years of education: Not on file  . Highest education level: Not on file  Occupational History  . Not on file   Tobacco Use  . Smoking status: Never Smoker  . Smokeless tobacco: Never Used  . Tobacco comment: never used tobacco  Vaping Use  . Vaping Use: Never used  Substance and Sexual Activity  . Alcohol use: Yes    Alcohol/week: 1.0 standard drink    Types: 1 Standard drinks or equivalent per week    Comment: 2-3 drinks a week   . Drug use: No  . Sexual activity: Not on file  Other Topics Concern  . 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 Has two grand babies   She enjoys reading and going to the gym. Hanging out with friends.       Social Determinants of Health   Financial Resource Strain: Not on file  Food Insecurity: Not on file  Transportation Needs: Not on file  Physical Activity: Not on file  Stress: Not on file  Social Connections: Not on file  Intimate Partner Violence: Not on file    Past Surgical History:  Procedure Laterality Date  . no surgeries at this time      Family History  Problem Relation Age of Onset  . Hypertension  Mother        fhx  . COPD Father   . Lung cancer Father   . Pancreatic cancer Brother   . Colon cancer Sister     No Known Allergies  Current Outpatient Medications on File Prior to Visit  Medication Sig Dispense Refill  . albuterol (VENTOLIN HFA) 108 (90 Base) MCG/ACT inhaler INHALE 2 PUFFS INTO THE LUNGS EVERY 6 (SIX) HOURS AS NEEDED FOR WHEEZING. 18 each 2  . BLACK COHOSH PO Take by mouth.    Marland Kitchen glipiZIDE (GLUCOTROL XL) 10 MG 24 hr tablet TAKE 1 TABLET (10 MG TOTAL) BY MOUTH IN THE MORNING AND AT BEDTIME. 180 tablet 0  . glucose blood (ACCU-CHEK GUIDE) test strip USE TO TEST BLOOD GLUCOSE TWICE DAILY 200 each 3  . JARDIANCE 10 MG TABS tablet TAKE 1 TABLET BY MOUTH DAILY BEFORE BREAKFAST. 90 tablet 0  . Lancets (ACCU-CHEK MULTICLIX) lancets USE TO TEST BLOOD GLUCOSE TWICE DAILY 200 each 3  . levothyroxine (SYNTHROID) 75 MCG tablet  TAKE 1 TABLET BY MOUTH EVERY DAY 90 tablet 2  . lisinopril (ZESTRIL) 20 MG tablet Take 1 tablet (20 mg total) by mouth daily. 90 tablet 3  . metFORMIN (GLUCOPHAGE) 1000 MG tablet TAKE 1 TABLET BY MOUTH TWICE A DAY 180 tablet 0  . Multiple Vitamin (MULTIVITAMIN WITH MINERALS) TABS Take 1 tablet by mouth daily.    Marland Kitchen OVER THE COUNTER MEDICATION Vitamin A, B12, C and D    . Turmeric (QC TUMERIC COMPLEX PO) Take by mouth.     No current facility-administered medications on file prior to visit.    LMP 05/17/2016       Objective:   Physical Exam Vitals and nursing note reviewed.  Constitutional:      Appearance: Normal appearance.  Cardiovascular:     Rate and Rhythm: Normal rate and regular rhythm.     Pulses: Normal pulses.     Heart sounds: Normal heart sounds.  Pulmonary:     Effort: Pulmonary effort is normal.     Breath sounds: Normal breath sounds.  Musculoskeletal:        General: Normal range of motion.  Skin:    General: Skin is warm and dry.  Neurological:     General: No focal deficit present.     Mental Status: She is alert and oriented to person, place, and time.  Psychiatric:        Mood and Affect: Mood normal.        Behavior: Behavior normal.        Thought Content: Thought content normal.        Judgment: Judgment normal.       Assessment & Plan:  1. Diabetes 1.5, managed as type 2 ([place])  - POCT glycosylated hemoglobin (Hb A1C)- 7.0 - has improved and at goal. Will switch her to [place] with lifestyle modifications  - Follow up in three months  - dapagliflozin propanediol (FARXIGA) 10 MG TABS tablet; Take 1 tablet (10 mg total) by mouth daily before breakfast.  Dispense: 90 tablet; Refill: 0   Dorothyann Peng, NP

## 2020-07-29 ENCOUNTER — Other Ambulatory Visit: Payer: Self-pay | Admitting: Adult Health

## 2020-07-29 DIAGNOSIS — E139 Other specified diabetes mellitus without complications: Secondary | ICD-10-CM

## 2020-08-23 ENCOUNTER — Other Ambulatory Visit: Payer: Self-pay | Admitting: Adult Health

## 2020-08-23 DIAGNOSIS — E139 Other specified diabetes mellitus without complications: Secondary | ICD-10-CM

## 2020-09-29 ENCOUNTER — Ambulatory Visit (INDEPENDENT_AMBULATORY_CARE_PROVIDER_SITE_OTHER): Payer: Managed Care, Other (non HMO) | Admitting: Family Medicine

## 2020-09-29 ENCOUNTER — Encounter: Payer: Self-pay | Admitting: Family Medicine

## 2020-09-29 ENCOUNTER — Other Ambulatory Visit: Payer: Self-pay | Admitting: Family Medicine

## 2020-09-29 ENCOUNTER — Other Ambulatory Visit: Payer: Self-pay

## 2020-09-29 VITALS — BP 142/102 | HR 60 | Temp 98.1°F | Wt 183.0 lb

## 2020-09-29 DIAGNOSIS — J40 Bronchitis, not specified as acute or chronic: Secondary | ICD-10-CM

## 2020-09-29 DIAGNOSIS — Z1152 Encounter for screening for COVID-19: Secondary | ICD-10-CM | POA: Diagnosis not present

## 2020-09-29 MED ORDER — AZITHROMYCIN 250 MG PO TABS
ORAL_TABLET | ORAL | 0 refills | Status: DC
Start: 2020-09-29 — End: 2020-10-20

## 2020-09-29 NOTE — Progress Notes (Signed)
   Subjective:    Patient ID: Autumn Acevedo, female    DOB: [birth year], 60 y.o.   MRN: [id number]  HPI Here for 2 weeks of a dry cough. She wheezes at times, but she has not felt SOB. No fever or chest pain. No body aches or NVD. She is drinking fluids and using her inhaler off and on. She has tried Delsym and Robitussin.    Review of Systems  Constitutional: Negative.   HENT: Negative.   Eyes: Negative.   Respiratory: Positive for cough and wheezing. Negative for chest tightness and shortness of breath.   Cardiovascular: Negative.   Gastrointestinal: Negative.        Objective:   Physical Exam Constitutional:      Appearance: Normal appearance. She is not ill-appearing.  HENT:     Right Ear: Tympanic membrane, ear canal and external ear normal.     Left Ear: Tympanic membrane, ear canal and external ear normal.     Nose: Nose normal.     Mouth/Throat:     Pharynx: Oropharynx is clear.  Eyes:     Conjunctiva/sclera: Conjunctivae normal.  Pulmonary:     Effort: Pulmonary effort is normal. No respiratory distress.     Breath sounds: Normal breath sounds. No stridor. No wheezing, rhonchi or rales.  Lymphadenopathy:     Cervical: No cervical adenopathy.  Neurological:     Mental Status: She is alert.           Assessment & Plan:  Bronchitis, treat with a Zpack. Add Mucniex BID. We also did obtained a nasal swab for a Covid-19 PCR test. Alysia Penna, MD

## 2020-10-01 LAB — COVID-19, FLU A+B AND RSV
Influenza A, NAA: NOT DETECTED
Influenza B, NAA: NOT DETECTED
RSV, NAA: NOT DETECTED
SARS-CoV-2, NAA: NOT DETECTED

## 2020-10-19 ENCOUNTER — Other Ambulatory Visit: Payer: Self-pay

## 2020-10-20 ENCOUNTER — Encounter: Payer: Self-pay | Admitting: Adult Health

## 2020-10-20 ENCOUNTER — Ambulatory Visit (INDEPENDENT_AMBULATORY_CARE_PROVIDER_SITE_OTHER): Payer: Managed Care, Other (non HMO) | Admitting: Adult Health

## 2020-10-20 VITALS — BP 142/86 | HR 71 | Temp 98.0°F | Ht 66.0 in | Wt 181.0 lb

## 2020-10-20 DIAGNOSIS — I10 Essential (primary) hypertension: Secondary | ICD-10-CM | POA: Diagnosis not present

## 2020-10-20 DIAGNOSIS — E139 Other specified diabetes mellitus without complications: Secondary | ICD-10-CM

## 2020-10-20 LAB — POCT GLYCOSYLATED HEMOGLOBIN (HGB A1C): Hemoglobin A1C: 8.2 % — AB (ref 4.0–5.6)

## 2020-10-20 LAB — HM DIABETES EYE EXAM

## 2020-10-20 MED ORDER — PIOGLITAZONE HCL 15 MG PO TABS
15.0000 mg | ORAL_TABLET | Freq: Every day | ORAL | 0 refills | Status: DC
Start: 2020-10-20 — End: 2021-01-07

## 2020-10-20 NOTE — Progress Notes (Signed)
Subjective:    Patient ID: Autumn Acevedo, female    DOB: 25-May-1960, 60 y.o.   MRN: [id number]  HPI  She presents to the office today for 34-month follow-up regarding diabetes mellitus and hypertension  DM -prescribed glipizide 10 mg ER daily, metformin 1000 mg twice daily.  She does monitor her blood sugars at home with readings reported in the 120- 170s. She is walking and trying to eat healthy.   Her last A1c in march 2022 was 7.0   We have tried to get her Lovie Macadamia, and Trulicity but all of these medications were >$800 a month.   Lab Results  Component Value Date   HGBA1C 8.2 (A) 10/20/2020   Wt Readings from Last 3 Encounters:  10/20/20 181 lb (82.1 kg)  09/29/20 183 lb (83 kg)  07/06/20 178 lb 9.6 oz (81 kg)   Hypertension - takes lisinopril 20 mg daily. Denies dizziness, lightheadedness, chest pain, or shortness of breath. She does not monitor her BP at home. Took her medications just prior to arrival.  BP Readings from Last 3 Encounters:  10/20/20 (!) 142/86  09/29/20 (!) 142/102  07/06/20 (!) 140/102   Review of Systems See HPI   Past Medical History:  Diagnosis Date   Allergy    Diabetes mellitus without complication ([place])    Type2    Goiter    Hypertension    Hypothyroid    ITP (idiopathic thrombocytopenic purpura)     Social History   Socioeconomic History   Marital status: Single    Spouse name: Not on file   Number of children: Not on file   Years of education: Not on file   Highest education level: Not on file  Occupational History   Not on file  Tobacco Use   Smoking status: Never   Smokeless tobacco: Never   Tobacco comments:    never used tobacco  Vaping Use   Vaping Use: Never used  Substance and Sexual Activity   Alcohol use: Yes    Alcohol/week: 1.0 standard drink    Types: 1 Standard drinks or equivalent per week    Comment: 2-3 drinks a week    Drug use: No   Sexual activity: Not on file  Other Topics Concern    Not on file  Social History Narrative   Works for an IT consultant    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 Has two grand babies   She enjoys reading and going to the gym. Hanging out with friends.       Social Determinants of Health   Financial Resource Strain: Not on file  Food Insecurity: Not on file  Transportation Needs: Not on file  Physical Activity: Not on file  Stress: Not on file  Social Connections: Not on file  Intimate Partner Violence: Not on file    Past Surgical History:  Procedure Laterality Date   no surgeries at this time      Family History  Problem Relation Age of Onset   Hypertension Mother        fhx   COPD Father    Lung cancer Father    Pancreatic cancer Brother    Colon cancer Sister     No Known Allergies  Current Outpatient Medications on File Prior to Visit  Medication Sig Dispense Refill   albuterol (VENTOLIN HFA) 108 (90 Base) MCG/ACT inhaler INHALE 2 PUFFS INTO THE LUNGS EVERY  6 (SIX) HOURS AS NEEDED FOR WHEEZING. 18 each 2   BLACK COHOSH PO Take by mouth.     glipiZIDE (GLUCOTROL XL) 10 MG 24 hr tablet TAKE 1 TABLET (10 MG TOTAL) BY MOUTH IN THE MORNING AND AT BEDTIME. 180 tablet 1   glucose blood (ACCU-CHEK GUIDE) test strip USE TO TEST BLOOD GLUCOSE TWICE DAILY 200 each 3   Lancets (ACCU-CHEK MULTICLIX) lancets USE TO TEST BLOOD GLUCOSE TWICE DAILY 200 each 3   levothyroxine (SYNTHROID) 75 MCG tablet TAKE 1 TABLET BY MOUTH EVERY DAY 90 tablet 2   lisinopril (ZESTRIL) 20 MG tablet Take 1 tablet (20 mg total) by mouth daily. 90 tablet 3   metFORMIN (GLUCOPHAGE) 1000 MG tablet TAKE 1 TABLET BY MOUTH TWICE A DAY 180 tablet 1   Multiple Vitamin (MULTIVITAMIN WITH MINERALS) TABS Take 1 tablet by mouth daily.     No current facility-administered medications on file prior to visit.    BP (!) 142/86   Pulse 71   Temp 98 F (36.7 C) (Oral)   Ht 5\' 6"  (1.676 m)   Wt 181 lb (82.1 kg)   LMP 05/17/2016    SpO2 100%   BMI 29.21 kg/m       Objective:   Physical Exam Vitals and nursing note reviewed.  Constitutional:      Appearance: Normal appearance. She is obese.  Cardiovascular:     Rate and Rhythm: Normal rate and regular rhythm.     Pulses: Normal pulses.     Heart sounds: Normal heart sounds.  Pulmonary:     Effort: Pulmonary effort is normal.     Breath sounds: Normal breath sounds.  Musculoskeletal:        General: Normal range of motion.  Skin:    General: Skin is warm and dry.  Neurological:     General: No focal deficit present.     Mental Status: She is alert and oriented to person, place, and time.  Psychiatric:        Mood and Affect: Mood normal.        Behavior: Behavior normal.        Thought Content: Thought content normal.        Judgment: Judgment normal.       Assessment & Plan:   1. Diabetes 1.5, managed as type 2 ([place])  - POC HgB A1c- 8.2. will add actos. Continue to with lifestyle modifications - pioglitazone (ACTOS) 15 MG tablet; Take 1 tablet (15 mg total) by mouth daily.  Dispense: 90 tablet; Refill: 0  2. Essential hypertension - BP not at goal.  - will have her monitor her BP at home - Send me results via mychart  - may need to increase Lisinopril   Dorothyann Peng, NP

## 2020-12-24 ENCOUNTER — Other Ambulatory Visit: Payer: Self-pay | Admitting: Adult Health

## 2020-12-24 DIAGNOSIS — I10 Essential (primary) hypertension: Secondary | ICD-10-CM

## 2020-12-31 ENCOUNTER — Encounter: Payer: Managed Care, Other (non HMO) | Admitting: Adult Health

## 2021-01-07 ENCOUNTER — Ambulatory Visit (INDEPENDENT_AMBULATORY_CARE_PROVIDER_SITE_OTHER): Payer: Managed Care, Other (non HMO) | Admitting: Adult Health

## 2021-01-07 ENCOUNTER — Other Ambulatory Visit: Payer: Self-pay

## 2021-01-07 ENCOUNTER — Encounter: Payer: Self-pay | Admitting: Adult Health

## 2021-01-07 VITALS — BP 140/100 | HR 50 | Temp 98.5°F | Ht 66.0 in | Wt 185.0 lb

## 2021-01-07 DIAGNOSIS — E139 Other specified diabetes mellitus without complications: Secondary | ICD-10-CM | POA: Diagnosis not present

## 2021-01-07 DIAGNOSIS — E039 Hypothyroidism, unspecified: Secondary | ICD-10-CM

## 2021-01-07 DIAGNOSIS — Z0000 Encounter for general adult medical examination without abnormal findings: Secondary | ICD-10-CM | POA: Diagnosis not present

## 2021-01-07 DIAGNOSIS — I10 Essential (primary) hypertension: Secondary | ICD-10-CM | POA: Diagnosis not present

## 2021-01-07 DIAGNOSIS — L80 Vitiligo: Secondary | ICD-10-CM

## 2021-01-07 LAB — CBC WITH DIFFERENTIAL/PLATELET
Basophils Absolute: 0 10*3/uL (ref 0.0–0.1)
Basophils Relative: 0.7 % (ref 0.0–3.0)
Eosinophils Absolute: 0.1 10*3/uL (ref 0.0–0.7)
Eosinophils Relative: 1.8 % (ref 0.0–5.0)
HCT: 41.8 % (ref 36.0–46.0)
Hemoglobin: 13.2 g/dL (ref 12.0–15.0)
Lymphocytes Relative: 49.5 % — ABNORMAL HIGH (ref 12.0–46.0)
Lymphs Abs: 2.7 10*3/uL (ref 0.7–4.0)
MCHC: 31.5 g/dL (ref 30.0–36.0)
MCV: 73.2 fl — ABNORMAL LOW (ref 78.0–100.0)
Monocytes Absolute: 0.3 10*3/uL (ref 0.1–1.0)
Monocytes Relative: 6.2 % (ref 3.0–12.0)
Neutro Abs: 2.3 10*3/uL (ref 1.4–7.7)
Neutrophils Relative %: 41.8 % — ABNORMAL LOW (ref 43.0–77.0)
Platelets: 242 10*3/uL (ref 150.0–400.0)
RBC: 5.72 Mil/uL — ABNORMAL HIGH (ref 3.87–5.11)
RDW: 14.4 % (ref 11.5–15.5)
WBC: 5.5 10*3/uL (ref 4.0–10.5)

## 2021-01-07 LAB — COMPREHENSIVE METABOLIC PANEL
ALT: 16 U/L (ref 0–35)
AST: 14 U/L (ref 0–37)
Albumin: 4.4 g/dL (ref 3.5–5.2)
Alkaline Phosphatase: 74 U/L (ref 39–117)
BUN: 13 mg/dL (ref 6–23)
CO2: 28 mEq/L (ref 19–32)
Calcium: 9.6 mg/dL (ref 8.4–10.5)
Chloride: 103 mEq/L (ref 96–112)
Creatinine, Ser: 0.79 mg/dL (ref 0.40–1.20)
GFR: 81.68 mL/min (ref >60.00)
Glucose, Bld: 80 mg/dL (ref 70–99)
Potassium: 4.8 mEq/L (ref 3.5–5.1)
Sodium: 140 mEq/L (ref 135–145)
Total Bilirubin: 0.4 mg/dL (ref 0.2–1.2)
Total Protein: 7.3 g/dL (ref 6.0–8.3)

## 2021-01-07 LAB — HEMOGLOBIN A1C: Hgb A1c MFr Bld: 8 % — ABNORMAL HIGH (ref 4.6–6.5)

## 2021-01-07 LAB — LIPID PANEL
Cholesterol: 154 mg/dL (ref 0–200)
HDL: 56.9 mg/dL (ref >39.00)
LDL Cholesterol: 79 mg/dL (ref 0–99)
NonHDL: 97.37
Total CHOL/HDL Ratio: 3
Triglycerides: 90 mg/dL (ref 0.0–149.0)
VLDL: 18 mg/dL (ref 0.0–40.0)

## 2021-01-07 LAB — TSH: TSH: 0.13 u[IU]/mL — ABNORMAL LOW (ref 0.35–5.50)

## 2021-01-07 MED ORDER — LISINOPRIL 40 MG PO TABS: 40.0000 mg | ORAL_TABLET | Freq: Every day | ORAL | 3 refills | Status: DC

## 2021-01-07 NOTE — Progress Notes (Addendum)
Subjective:    Patient ID: Autumn Acevedo, female    DOB: [birth year], 60 y.o.   MRN: [id number]  HPI Patient presents for yearly preventative medicine examination. She is a pleasant 60 year old female who  has a past medical history of Allergy, Diabetes mellitus without complication ([place]), Goiter, Hypertension, Hypothyroid, and ITP (idiopathic thrombocytopenic purpura).  DM -prescribed glipizide 10 mg extended release twice daily, metformin 1000 mg twice daily.  In the past we have tried to get her on Farxiga, Jardiance, and Trulicity but all these medications are greater than $800 a month.  During her last A1c check her A1c had increased from 7.0-8.2.  At this time Actos 15 mg was added to her regiment. She reports that her blood sugars have been " all over the place"   Lab Results  Component Value Date   HGBA1C 8.2 (A) 10/20/2020   HTN -takes lisinopril 20 mg daily.  Denies dizziness, lightheadedness, chest pain, shortness of breath. She has been checking at home with her norm being 140/90-100 at home.  BP Readings from Last 3 Encounters:  01/07/21 (!) 140/100  10/20/20 (!) 142/86  09/29/20 (!) 142/102   Hypothyroidism -Currently prescribed Synthroid 88 mcg daily. Lab Results  Component Value Date   TSH 0.03 (L) 12/31/2019   Vitiligo -has started to notice patches of hypopigmentation along her hairline on her forehead.  Patches seem to be becoming more apparent and more numerous.  Would like a referral to dermatology  All immunizations and health maintenance protocols were reviewed with the patient and needed orders were placed.  Appropriate screening laboratory values were ordered for the patient including screening of hyperlipidemia, renal function and hepatic function. If indicated by BPH, a PSA was ordered.  Medication reconciliation,  past medical history, social history, problem list and allergies were reviewed in detail with the patient  Goals were established with  regard to weight loss, exercise, and  diet in compliance with medications Wt Readings from Last 3 Encounters:  01/07/21 185 lb (83.9 kg)  10/20/20 181 lb (82.1 kg)  09/29/20 183 lb (83 kg)    Review of Systems  Constitutional: Negative.   HENT: Negative.    Eyes: Negative.   Respiratory: Negative.    Cardiovascular: Negative.   Gastrointestinal: Negative.   Endocrine: Negative.   Genitourinary: Negative.   Musculoskeletal: Negative.   Skin:  Positive for color change.  Allergic/Immunologic: Negative.   Neurological: Negative.   Hematological: Negative.   Psychiatric/Behavioral: Negative.     Past Medical History:  Diagnosis Date   Allergy    Diabetes mellitus without complication (HCC)    Type2    Goiter    Hypertension    Hypothyroid    ITP (idiopathic thrombocytopenic purpura)     Social History   Socioeconomic History   Marital status: Single    Spouse name: Not on file   Number of children: Not on file   Years of education: Not on file   Highest education level: Not on file  Occupational History   Not on file  Tobacco Use   Smoking status: Never   Smokeless tobacco: Never   Tobacco comments:    never used tobacco  Vaping Use   Vaping Use: Never used  Substance and Sexual Activity   Alcohol use: Yes    Alcohol/week: 1.0 standard drink    Types: 1 Standard drinks or equivalent per week    Comment: 2-3 drinks a week    Drug use:  No   Sexual activity: Not on file  Other Topics Concern   Not on file  Social History Narrative   Works for an [employer] consultant    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 Has two grand babies   She enjoys reading and going to the gym. Hanging out with friends.       Social Determinants of Health   Financial Resource Strain: Not on file  Food Insecurity: Not on file  Transportation Needs: Not on file  Physical Activity: Not on file  Stress: Not on file  Social Connections: Not on file   Intimate Partner Violence: Not on file    Past Surgical History:  Procedure Laterality Date   no surgeries at this time      Family History  Problem Relation Age of Onset   Hypertension Mother        fhx   COPD Father    Lung cancer Father    Pancreatic cancer Brother    Colon cancer Sister     No Known Allergies  Current Outpatient Medications on File Prior to Visit  Medication Sig Dispense Refill   albuterol (VENTOLIN HFA) 108 (90 Base) MCG/ACT inhaler INHALE 2 PUFFS INTO THE LUNGS EVERY 6 (SIX) HOURS AS NEEDED FOR WHEEZING. 18 each 2   BLACK COHOSH PO Take by mouth.     glucose blood (ACCU-CHEK GUIDE) test strip USE TO TEST BLOOD GLUCOSE TWICE DAILY 200 each 3   Lancets (ACCU-CHEK MULTICLIX) lancets USE TO TEST BLOOD GLUCOSE TWICE DAILY 200 each 3   levothyroxine (SYNTHROID) 75 MCG tablet TAKE 1 TABLET BY MOUTH EVERY DAY 90 tablet 2   lisinopril (ZESTRIL) 20 MG tablet TAKE 1 TABLET BY MOUTH EVERY DAY 30 tablet 0   metFORMIN (GLUCOPHAGE) 1000 MG tablet TAKE 1 TABLET BY MOUTH TWICE A DAY 180 tablet 1   Multiple Vitamin (MULTIVITAMIN WITH MINERALS) TABS Take 1 tablet by mouth daily.     pioglitazone (ACTOS) 15 MG tablet Take 1 tablet (15 mg total) by mouth daily. 90 tablet 0   glipiZIDE (GLUCOTROL XL) 10 MG 24 hr tablet TAKE 1 TABLET (10 MG TOTAL) BY MOUTH IN THE MORNING AND AT BEDTIME. 180 tablet 1   No current facility-administered medications on file prior to visit.    BP (!) 140/100   Pulse (!) 50   Temp 98.5 F (36.9 C) (Oral)   Ht '5\' 6"'$  (1.676 m)   Wt 185 lb (83.9 kg)   LMP 05/17/2016   SpO2 100%   BMI 29.86 kg/m        Objective:   Physical Exam Vitals and nursing note reviewed.  Constitutional:      Appearance: Normal appearance.  Neck:     Thyroid: Thyroid mass (benign goiter on right side) present.     Trachea: Trachea normal. No tracheal deviation.  Cardiovascular:     Rate and Rhythm: Normal rate and regular rhythm.     Pulses: Normal pulses.      Heart sounds: Normal heart sounds.  Pulmonary:     Effort: Pulmonary effort is normal.     Breath sounds: Normal breath sounds.  Abdominal:     General: Abdomen is flat.     Palpations: Abdomen is soft.  Musculoskeletal:        General: Normal range of motion.  Skin:    General: Skin is warm and dry.     Comments: Small circular  spots of hypopigmentation noted along hairline.  Neurological:     General: No focal deficit present.     Mental Status: She is alert and oriented to person, place, and time.  Psychiatric:        Mood and Affect: Mood normal.        Behavior: Behavior normal.        Thought Content: Thought content normal.        Judgment: Judgment normal.      Assessment & Plan:  1. Routine general medical examination at a health care facility  - CBC with Differential/Platelet; Future - Comprehensive metabolic panel; Future - Hemoglobin A1c; Future - Lipid panel; Future - TSH; Future - TSH - Lipid panel - Hemoglobin A1c - Comprehensive metabolic panel - CBC with Differential/Platelet  2. Essential hypertension - Will increase lisinopril to 40 mg.  - Follow up in one month  - CBC with Differential/Platelet; Future - Comprehensive metabolic panel; Future - Hemoglobin A1c; Future - Lipid panel; Future - TSH; Future - lisinopril (ZESTRIL) 40 MG tablet; Take 1 tablet (40 mg total) by mouth daily.  Dispense: 90 tablet; Refill: 3 - TSH - Lipid panel - Hemoglobin A1c - Comprehensive metabolic panel - CBC with Differential/Platelet  3. Diabetes 1.5, managed as type 2 ([place]) - D/c Actos and will start on Mounjaro. Starting dose givem  - Follow up in one month  - CBC with Differential/Platelet; Future - Comprehensive metabolic panel; Future - Hemoglobin A1c; Future - Lipid panel; Future - TSH; Future - TSH - Lipid panel - Hemoglobin A1c - Comprehensive metabolic panel - CBC with Differential/Platelet  4. Acquired hypothyroidism - Consider increase  in synthroid - CBC with Differential/Platelet; Future - Comprehensive metabolic panel; Future - Hemoglobin A1c; Future - Lipid panel; Future - TSH; Future - TSH - Lipid panel - Hemoglobin A1c - Comprehensive metabolic panel - CBC with Differential/Platelet  5. Vitiligo  - Ambulatory referral to Dermatology  Dorothyann Peng, NP

## 2021-01-07 NOTE — Addendum Note (Signed)
Addended by: Apolinar Junes on: 01/07/2021 04:46 PM   Modules accepted: Orders

## 2021-01-11 ENCOUNTER — Other Ambulatory Visit: Payer: Self-pay | Admitting: Adult Health

## 2021-01-11 MED ORDER — LEVOTHYROXINE SODIUM 50 MCG PO TABS: 50.0000 ug | ORAL_TABLET | Freq: Every day | ORAL | 0 refills | Status: DC

## 2021-01-18 ENCOUNTER — Other Ambulatory Visit: Payer: Self-pay | Admitting: Adult Health

## 2021-01-18 DIAGNOSIS — I10 Essential (primary) hypertension: Secondary | ICD-10-CM

## 2021-01-25 ENCOUNTER — Other Ambulatory Visit: Payer: Self-pay | Admitting: Adult Health

## 2021-01-25 DIAGNOSIS — E139 Other specified diabetes mellitus without complications: Secondary | ICD-10-CM

## 2021-02-03 ENCOUNTER — Other Ambulatory Visit: Payer: Self-pay | Admitting: Adult Health

## 2021-02-09 ENCOUNTER — Other Ambulatory Visit: Payer: Self-pay | Admitting: Adult Health

## 2021-02-09 ENCOUNTER — Ambulatory Visit (INDEPENDENT_AMBULATORY_CARE_PROVIDER_SITE_OTHER): Payer: Managed Care, Other (non HMO) | Admitting: Adult Health

## 2021-02-09 ENCOUNTER — Encounter: Payer: Self-pay | Admitting: Adult Health

## 2021-02-09 ENCOUNTER — Other Ambulatory Visit: Payer: Self-pay

## 2021-02-09 VITALS — BP 150/100 | HR 88 | Temp 98.9°F | Ht 66.0 in | Wt 187.0 lb

## 2021-02-09 DIAGNOSIS — I10 Essential (primary) hypertension: Secondary | ICD-10-CM

## 2021-02-09 DIAGNOSIS — E139 Other specified diabetes mellitus without complications: Secondary | ICD-10-CM | POA: Diagnosis not present

## 2021-02-09 DIAGNOSIS — Z23 Encounter for immunization: Secondary | ICD-10-CM

## 2021-02-09 DIAGNOSIS — Z1231 Encounter for screening mammogram for malignant neoplasm of breast: Secondary | ICD-10-CM

## 2021-02-09 MED ORDER — AMLODIPINE BESYLATE 2.5 MG PO TABS: 2.5000 mg | ORAL_TABLET | Freq: Every day | ORAL | 0 refills | Status: DC

## 2021-02-09 MED ORDER — TIRZEPATIDE 5 MG/0.5ML ~~LOC~~ SOAJ: 5.0000 mg | SUBCUTANEOUS | 0 refills | Status: DC

## 2021-02-09 NOTE — Progress Notes (Signed)
Subjective:    Patient ID: Autumn Acevedo, female    DOB: 27-Nov-1960, 60 y.o.   MRN: [id number]  HPI 60 year old female who  has a past medical history of Allergy, Diabetes mellitus without complication ([place]), Goiter, Hypertension, Hypothyroid, and ITP (idiopathic thrombocytopenic purpura).  She presents to the office today for one month follow up regarding HTN and DM   During her CPE 1 month ago her blood pressure was not at goal, we increased her lisinopril to 40 mg from 20 mg.  Since the increase she has not experienced any dizziness, lightheadedness, chest pain, or shortness of breath.  She has been monitoring her blood pressure at home with readings consistently in the 140-150's /90's.   BP Readings from Last 3 Encounters:  02/09/21 (!) 150/100  01/07/21 (!) 140/100  10/20/20 (!) 142/86    DM -has been hard to control in the past.  During her CPE her A1c was 8.0, this was down from 8.23 months prior.  She was kept on glipizide 10 mg extended release and metformin 1000 mg twice daily.  Actos was discontinued and she was placed on Mounjaro 2.5 mg. Since starting [facility] her blood sugars have been better controlled. Most her blood sugars have been in the 115 range with the highest being 151. She is not eating as much and feels as though her cravings are not as bad. Has not experienced any side effects.   Lab Results  Component Value Date   HGBA1C 8.0 (H) 01/07/2021   Wt Readings from Last 3 Encounters:  02/09/21 187 lb (84.8 kg)  01/07/21 185 lb (83.9 kg)  10/20/20 181 lb (82.1 kg)   Review of Systems See HPI   Past Medical History:  Diagnosis Date   Allergy    Diabetes mellitus without complication ([place])    Type2    Goiter    Hypertension    Hypothyroid    ITP (idiopathic thrombocytopenic purpura)     Social History   Socioeconomic History   Marital status: Single    Spouse name: Not on file   Number of children: Not on file   Years of education: Not on file    Highest education level: Not on file  Occupational History   Not on file  Tobacco Use   Smoking status: Never   Smokeless tobacco: Never   Tobacco comments:    never used tobacco  Vaping Use   Vaping Use: Never used  Substance and Sexual Activity   Alcohol use: Yes    Alcohol/week: 1.0 standard drink    Types: 1 Standard drinks or equivalent per week    Comment: 2-3 drinks a week    Drug use: No   Sexual activity: Not on file  Other Topics Concern   Not on file  Social History Narrative   Works for an IT consultant    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 Has two grand babies   She enjoys reading and going to the gym. Hanging out with friends.       Social Determinants of Health   Financial Resource Strain: Not on file  Food Insecurity: Not on file  Transportation Needs: Not on file  Physical Activity: Not on file  Stress: Not on file  Social Connections: Not on file  Intimate Partner Violence: Not on file    Past Surgical History:  Procedure Laterality Date   no surgeries at this  time      Family History  Problem Relation Age of Onset   Hypertension Mother        fhx   COPD Father    Lung cancer Father    Pancreatic cancer Brother    Colon cancer Sister     No Known Allergies  Current Outpatient Medications on File Prior to Visit  Medication Sig Dispense Refill   albuterol (VENTOLIN HFA) 108 (90 Base) MCG/ACT inhaler INHALE 2 PUFFS INTO THE LUNGS EVERY 6 (SIX) HOURS AS NEEDED FOR WHEEZING. 18 each 2   BLACK COHOSH PO Take by mouth.     glipiZIDE (GLUCOTROL XL) 10 MG 24 hr tablet TAKE 1 TABLET (10 MG TOTAL) BY MOUTH IN THE MORNING AND AT BEDTIME. 180 tablet 0   glucose blood (ACCU-CHEK GUIDE) test strip USE TO TEST BLOOD GLUCOSE TWICE DAILY 200 each 3   Lancets (ACCU-CHEK MULTICLIX) lancets USE TO TEST BLOOD GLUCOSE TWICE DAILY 200 each 3   levothyroxine (SYNTHROID) 50 MCG tablet Take 1 tablet (50 mcg total) by mouth  daily. 30 tablet 0   lisinopril (ZESTRIL) 40 MG tablet Take 1 tablet (40 mg total) by mouth daily. 90 tablet 3   metFORMIN (GLUCOPHAGE) 1000 MG tablet TAKE 1 TABLET BY MOUTH TWICE A DAY 180 tablet 1   Multiple Vitamin (MULTIVITAMIN WITH MINERALS) TABS Take 1 tablet by mouth daily.     No current facility-administered medications on file prior to visit.    BP (!) 150/100   Pulse 88   Temp 98.9 F (37.2 C) (Oral)   Ht 5\' 6"  (1.676 m)   Wt 187 lb (84.8 kg)   LMP 05/17/2016   SpO2 100%   BMI 30.18 kg/m       Objective:   Physical Exam Vitals and nursing note reviewed.  Constitutional:      General: She is not in acute distress.    Appearance: Normal appearance. She is well-developed. She is not ill-appearing.  HENT:     Head: Normocephalic and atraumatic.     Right Ear: Tympanic membrane, ear canal and external ear normal. There is no impacted cerumen.     Left Ear: Tympanic membrane, ear canal and external ear normal. There is no impacted cerumen.     Nose: Nose normal. No congestion or rhinorrhea.     Mouth/Throat:     Mouth: Mucous membranes are moist.     Pharynx: Oropharynx is clear. No oropharyngeal exudate or posterior oropharyngeal erythema.  Eyes:     General:        Right eye: No discharge.        Left eye: No discharge.     Extraocular Movements: Extraocular movements intact.     Conjunctiva/sclera: Conjunctivae normal.     Pupils: Pupils are equal, round, and reactive to light.  Neck:     Thyroid: No thyromegaly.     Vascular: No carotid bruit.     Trachea: No tracheal deviation.  Cardiovascular:     Rate and Rhythm: Normal rate and regular rhythm.     Pulses: Normal pulses.     Heart sounds: Normal heart sounds. No murmur heard.   No friction rub. No gallop.  Pulmonary:     Effort: Pulmonary effort is normal. No respiratory distress.     Breath sounds: Normal breath sounds. No stridor. No wheezing, rhonchi or rales.  Chest:     Chest wall: No  tenderness.  Abdominal:     General: Abdomen is  flat. Bowel sounds are normal. There is no distension.     Palpations: Abdomen is soft. There is no mass.     Tenderness: There is no abdominal tenderness. There is no right CVA tenderness, left CVA tenderness, guarding or rebound.     Hernia: No hernia is present.  Musculoskeletal:        General: No swelling, tenderness, deformity or signs of injury. Normal range of motion.     Cervical back: Normal range of motion and neck supple.     Right lower leg: No edema.     Left lower leg: No edema.  Lymphadenopathy:     Cervical: No cervical adenopathy.  Skin:    General: Skin is warm and dry.     Coloration: Skin is not jaundiced or pale.     Findings: No bruising, erythema, lesion or rash.  Neurological:     General: No focal deficit present.     Mental Status: She is alert and oriented to person, place, and time.     Cranial Nerves: No cranial nerve deficit.     Sensory: No sensory deficit.     Motor: No weakness.     Coordination: Coordination normal.     Gait: Gait normal.     Deep Tendon Reflexes: Reflexes normal.  Psychiatric:        Mood and Affect: Mood normal.        Behavior: Behavior normal.        Thought Content: Thought content normal.        Judgment: Judgment normal.      Assessment & Plan:  1. Essential hypertension - Not at goal.  - Will add Norvasc 2.5 mg to regimen  - amLODipine (NORVASC) 2.5 MG tablet; Take 1 tablet (2.5 mg total) by mouth daily.  Dispense: 90 tablet; Refill: 0  2. Diabetes 1.5, managed as type 2 ([place]) - Will increase Mounjaro to 5 mg - D/c Metformin. Advised if BS above 150 consistently then to take 1/2 dose of metformin. Continue with Glipizide.  - Follow up in one month  - tirzepatide [facility]) 5 MG/0.5ML Pen; Inject 5 mg into the skin once a week.  Dispense: 6 mL; Refill: 0  3. Need for immunization against influenza  - Flu Vaccine QUAD 23mo+IM (Fluarix, Fluzone & Alfiuria Quad  PF)   Dorothyann Peng, NP

## 2021-02-14 ENCOUNTER — Other Ambulatory Visit: Payer: Self-pay | Admitting: Adult Health

## 2021-02-14 DIAGNOSIS — E139 Other specified diabetes mellitus without complications: Secondary | ICD-10-CM

## 2021-02-14 DIAGNOSIS — I10 Essential (primary) hypertension: Secondary | ICD-10-CM

## 2021-02-14 DIAGNOSIS — Z760 Encounter for issue of repeat prescription: Secondary | ICD-10-CM

## 2021-02-15 ENCOUNTER — Encounter: Payer: Self-pay | Admitting: Adult Health

## 2021-02-15 ENCOUNTER — Other Ambulatory Visit: Payer: Self-pay | Admitting: Adult Health

## 2021-03-09 ENCOUNTER — Ambulatory Visit
Admission: RE | Admit: 2021-03-09 | Discharge: 2021-03-09 | Disposition: A | Discharge status: Home / Self Care | Payer: Managed Care, Other (non HMO) | Source: Ambulatory Visit | Attending: Adult Health | Admitting: Adult Health

## 2021-03-09 ENCOUNTER — Other Ambulatory Visit: Payer: Self-pay

## 2021-03-09 DIAGNOSIS — Z1231 Encounter for screening mammogram for malignant neoplasm of breast: Secondary | ICD-10-CM

## 2021-03-13 ENCOUNTER — Other Ambulatory Visit: Payer: Self-pay | Admitting: Adult Health

## 2021-03-16 ENCOUNTER — Ambulatory Visit (INDEPENDENT_AMBULATORY_CARE_PROVIDER_SITE_OTHER): Payer: Managed Care, Other (non HMO) | Admitting: Adult Health

## 2021-03-16 ENCOUNTER — Encounter: Payer: Self-pay | Admitting: Adult Health

## 2021-03-16 VITALS — BP 120/82 | HR 108 | Temp 98.5°F | Ht 66.0 in | Wt 184.0 lb

## 2021-03-16 DIAGNOSIS — I10 Essential (primary) hypertension: Secondary | ICD-10-CM

## 2021-03-16 DIAGNOSIS — E139 Other specified diabetes mellitus without complications: Secondary | ICD-10-CM | POA: Diagnosis not present

## 2021-03-16 MED ORDER — TIRZEPATIDE 7.5 MG/0.5ML ~~LOC~~ SOAJ: 7.5000 mg | SUBCUTANEOUS | 0 refills | Status: AC

## 2021-03-16 MED ORDER — ACCU-CHEK GUIDE VI STRP: ORAL_STRIP | 3 refills | Status: DC

## 2021-03-16 MED ORDER — TIRZEPATIDE 7.5 MG/0.5ML ~~LOC~~ SOAJ: 7.5000 mg | SUBCUTANEOUS | 0 refills | Status: DC

## 2021-03-16 MED ORDER — ACCU-CHEK MULTICLIX LANCETS MISC: 3 refills | Status: DC

## 2021-03-16 NOTE — Patient Instructions (Signed)
You are doing amazing.

## 2021-03-16 NOTE — Progress Notes (Signed)
Subjective:    Patient ID: Autumn Acevedo, female    DOB: 10/17/1960, 60 y.o.   MRN: [id number]  HPI 60 year old female who  has a past medical history of Allergy, Diabetes mellitus without complication ([place]), Goiter, Hypertension, Hypothyroid, and ITP (idiopathic thrombocytopenic purpura).  She presents to the office today for 1 month follow-up regarding hypertension and diabetes  Diabetes mellitus-blood sugar has been hard to control in her past.  She is currently maintained on glipizide 10 mg extended release and Mounjaro 5 mg weekly.  She has not experienced any side effects with Mounjaro. Her portion sizes have decreased and so have cravings. Her blood sugars have been between below 140 every day.   Wt Readings from Last 3 Encounters:  03/16/21 184 lb (83.5 kg)  02/09/21 187 lb (84.8 kg)  01/07/21 185 lb (83.9 kg)    HTN -currently maintained on lisinopril 40 mg and Norvasc 2.5 mg.  Her blood pressure was not at goal during her last visit so Norvasc 2.5 mg was added to her regimen.  Since adding Norvasc she reports readings at home and though 120s to 130s over 80s.  She denies chest pain, shortness of breath, dizziness, or lightheadedness BP Readings from Last 3 Encounters:  03/16/21 120/82  02/09/21 (!) 150/100  01/07/21 (!) 140/100   Review of Systems See HPI   Past Medical History:  Diagnosis Date   Allergy    Diabetes mellitus without complication ([place])    Type2    Goiter    Hypertension    Hypothyroid    ITP (idiopathic thrombocytopenic purpura)     Social History   Socioeconomic History   Marital status: Single    Spouse name: Not on file   Number of children: Not on file   Years of education: Not on file   Highest education level: Not on file  Occupational History   Not on file  Tobacco Use   Smoking status: Never   Smokeless tobacco: Never   Tobacco comments:    never used tobacco  Vaping Use   Vaping Use: Never used  Substance and Sexual  Activity   Alcohol use: Yes    Alcohol/week: 1.0 standard drink    Types: 1 Standard drinks or equivalent per week    Comment: 2-3 drinks a week    Drug use: No   Sexual activity: Not on file  Other Topics Concern   Not on file  Social History Narrative   Works for an IT consultant    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 Has two grand babies   She enjoys reading and going to the gym. Hanging out with friends.       Social Determinants of Health   Financial Resource Strain: Not on file  Food Insecurity: Not on file  Transportation Needs: Not on file  Physical Activity: Not on file  Stress: Not on file  Social Connections: Not on file  Intimate Partner Violence: Not on file    Past Surgical History:  Procedure Laterality Date   no surgeries at this time      Family History  Problem Relation Age of Onset   Hypertension Mother        fhx   COPD Father    Lung cancer Father    Pancreatic cancer Brother    Colon cancer Sister     No Known Allergies  Current Outpatient Medications on  File Prior to Visit  Medication Sig Dispense Refill   albuterol (VENTOLIN HFA) 108 (90 Base) MCG/ACT inhaler INHALE 2 PUFFS INTO THE LUNGS EVERY 6 (SIX) HOURS AS NEEDED FOR WHEEZING. 18 each 2   amLODipine (NORVASC) 2.5 MG tablet Take 1 tablet (2.5 mg total) by mouth daily. 90 tablet 0   BLACK COHOSH PO Take by mouth.     glipiZIDE (GLUCOTROL XL) 10 MG 24 hr tablet TAKE 1 TABLET (10 MG TOTAL) BY MOUTH IN THE MORNING AND AT BEDTIME. 180 tablet 0   glucose blood (ACCU-CHEK GUIDE) test strip USE TO TEST BLOOD GLUCOSE TWICE DAILY 200 each 3   Lancets (ACCU-CHEK MULTICLIX) lancets USE TO TEST BLOOD GLUCOSE TWICE DAILY 200 each 3   levothyroxine (SYNTHROID) 50 MCG tablet TAKE 1 TABLET BY MOUTH EVERY DAY 30 tablet 0   lisinopril (ZESTRIL) 40 MG tablet Take 1 tablet (40 mg total) by mouth daily. 90 tablet 3   metFORMIN (GLUCOPHAGE) 1000 MG tablet TAKE 1 TABLET BY  MOUTH TWICE A DAY 180 tablet 1   Multiple Vitamin (MULTIVITAMIN WITH MINERALS) TABS Take 1 tablet by mouth daily.     tirzepatide [facility]) 5 MG/0.5ML Pen Inject 5 mg into the skin once a week. 6 mL 0   No current facility-administered medications on file prior to visit.    BP 120/82   Pulse (!) 108   Temp 98.5 F (36.9 C) (Oral)   Ht 5\' 6"  (1.676 m)   Wt 184 lb (83.5 kg)   LMP 05/17/2016   SpO2 98%   BMI 29.70 kg/m        Objective:   Physical Exam Vitals and nursing note reviewed.  Constitutional:      Appearance: Normal appearance. She is obese.  Cardiovascular:     Rate and Rhythm: Normal rate and regular rhythm.     Pulses: Normal pulses.     Heart sounds: Normal heart sounds.  Pulmonary:     Effort: Pulmonary effort is normal.     Breath sounds: Normal breath sounds.  Musculoskeletal:        General: Normal range of motion.  Skin:    General: Skin is warm and dry.  Neurological:     General: No focal deficit present.     Mental Status: She is alert and oriented to person, place, and time.  Psychiatric:        Mood and Affect: Mood normal.        Behavior: Behavior normal.        Thought Content: Thought content normal.        Judgment: Judgment normal.      Assessment & Plan:   1. Essential hypertension - Well controlled. No change in medications    2. Diabetes 1.5, managed as type 2 ([place]) - Will increase Mounjaro to 7.5 mg weekly.  - Follow up in one month for A1c check  - Lancets (ACCU-CHEK MULTICLIX) lancets; USE TO TEST BLOOD GLUCOSE TWICE DAILY  Dispense: 200 each; Refill: 3 - glucose blood (ACCU-CHEK GUIDE) test strip; USE TO TEST BLOOD GLUCOSE TWICE DAILY  Dispense: 200 each; Refill: 3 - tirzepatide (MOUNJARO) 7.5 MG/0.5ML Pen; Inject 7.5 mg into the skin once a week.  Dispense: 2 mL; Refill: 0   Dorothyann Peng, NP

## 2021-03-21 ENCOUNTER — Other Ambulatory Visit: Payer: Self-pay | Admitting: Adult Health

## 2021-03-21 DIAGNOSIS — Z760 Encounter for issue of repeat prescription: Secondary | ICD-10-CM

## 2021-03-21 DIAGNOSIS — I10 Essential (primary) hypertension: Secondary | ICD-10-CM

## 2021-03-22 ENCOUNTER — Encounter: Payer: Self-pay | Admitting: Adult Health

## 2021-03-22 NOTE — Telephone Encounter (Signed)
Please advise 

## 2021-03-23 ENCOUNTER — Other Ambulatory Visit: Payer: Self-pay | Admitting: Adult Health

## 2021-03-23 DIAGNOSIS — E139 Other specified diabetes mellitus without complications: Secondary | ICD-10-CM

## 2021-04-20 ENCOUNTER — Ambulatory Visit
Admission: EM | Admit: 2021-04-20 | Discharge: 2021-04-20 | Disposition: A | Discharge status: Home / Self Care | Payer: Managed Care, Other (non HMO) | Attending: Internal Medicine | Admitting: Internal Medicine

## 2021-04-20 ENCOUNTER — Ambulatory Visit (INDEPENDENT_AMBULATORY_CARE_PROVIDER_SITE_OTHER): Payer: Managed Care, Other (non HMO) | Admitting: Adult Health

## 2021-04-20 ENCOUNTER — Encounter: Payer: Self-pay | Admitting: Emergency Medicine

## 2021-04-20 ENCOUNTER — Other Ambulatory Visit: Payer: Self-pay

## 2021-04-20 VITALS — BP 130/86 | HR 89 | Temp 98.8°F | Wt 186.8 lb

## 2021-04-20 DIAGNOSIS — R1013 Epigastric pain: Secondary | ICD-10-CM | POA: Diagnosis not present

## 2021-04-20 DIAGNOSIS — E139 Other specified diabetes mellitus without complications: Secondary | ICD-10-CM | POA: Diagnosis not present

## 2021-04-20 DIAGNOSIS — I10 Essential (primary) hypertension: Secondary | ICD-10-CM | POA: Diagnosis not present

## 2021-04-20 LAB — POCT GLYCOSYLATED HEMOGLOBIN (HGB A1C): Hemoglobin A1C: 7.5 % — AB (ref 4.0–5.6)

## 2021-04-20 MED ORDER — TIRZEPATIDE 7.5 MG/0.5ML ~~LOC~~ SOAJ: 7.5000 mg | SUBCUTANEOUS | 0 refills | Status: DC

## 2021-04-20 NOTE — Progress Notes (Signed)
Subjective:    Patient ID: Autumn Acevedo, female    DOB: July 28, 1960, 60 y.o.   MRN: [id number]  HPI  60 year old female who  has a past medical history of Allergy, Diabetes mellitus without complication ([place]), Goiter, Hypertension, Hypothyroid, and ITP (idiopathic thrombocytopenic purpura).  She presents to the office today for follow-up regarding hypertension and diabetes  Diabetes mellitus-blood sugars have been hard to control in the past.  Currently maintained with glipizide 10 mg ER BID  and Mounjaro 5 mg weekly. She was not able to get the 7.5 mg dose.  She has been tolerating her medications well.  Reports that portion sizes have decreased and so have her cravings.  She does monitor her blood sugars reports below 140 on a daily basis. Lab Results  Component Value Date   HGBA1C 7.5 (A) 04/20/2021   Wt Readings from Last 3 Encounters:  04/20/21 186 lb 12.8 oz (84.7 kg)  03/16/21 184 lb (83.5 kg)  02/09/21 187 lb (84.8 kg)   HTN -maintained with lisinopril 40 mg daily and Norvasc 2.5 mg daily.  She does monitor her blood pressures at home with readings consistently in the 120s to 130s over 80s.  She denies chest pain, shortness of breath, dizziness, or lightheadedness BP Readings from Last 3 Encounters:  04/20/21 130/86  03/16/21 120/82  02/09/21 (!) 150/100   Review of Systems See HPI   Past Medical History:  Diagnosis Date   Allergy    Diabetes mellitus without complication ([place])    Type2    Goiter    Hypertension    Hypothyroid    ITP (idiopathic thrombocytopenic purpura)     Social History   Socioeconomic History   Marital status: Single    Spouse name: Not on file   Number of children: Not on file   Years of education: Not on file   Highest education level: Not on file  Occupational History   Not on file  Tobacco Use   Smoking status: Never   Smokeless tobacco: Never   Tobacco comments:    never used tobacco  Vaping Use   Vaping Use: Never used   Substance and Sexual Activity   Alcohol use: Yes    Alcohol/week: 1.0 standard drink    Types: 1 Standard drinks or equivalent per week    Comment: 2-3 drinks a week    Drug use: No   Sexual activity: Not on file  Other Topics Concern   Not on file  Social History Narrative   Works for an IT consultant    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 Has two grand babies   She enjoys reading and going to the gym. Hanging out with friends.       Social Determinants of Health   Financial Resource Strain: Not on file  Food Insecurity: Not on file  Transportation Needs: Not on file  Physical Activity: Not on file  Stress: Not on file  Social Connections: Not on file  Intimate Partner Violence: Not on file    Past Surgical History:  Procedure Laterality Date   no surgeries at this time      Family History  Problem Relation Age of Onset   Hypertension Mother        fhx   COPD Father    Lung cancer Father    Pancreatic cancer Brother    Colon cancer Sister  No Known Allergies  Current Outpatient Medications on File Prior to Visit  Medication Sig Dispense Refill   albuterol (VENTOLIN HFA) 108 (90 Base) MCG/ACT inhaler INHALE 2 PUFFS INTO THE LUNGS EVERY 6 (SIX) HOURS AS NEEDED FOR WHEEZING. 18 each 2   amLODipine (NORVASC) 2.5 MG tablet Take 1 tablet (2.5 mg total) by mouth daily. 90 tablet 0   BLACK COHOSH PO Take by mouth.     glipiZIDE (GLUCOTROL XL) 10 MG 24 hr tablet TAKE 1 TABLET (10 MG TOTAL) BY MOUTH IN THE MORNING AND AT BEDTIME. 180 tablet 0   glucose blood (ACCU-CHEK GUIDE) test strip USE TO TEST BLOOD GLUCOSE TWICE DAILY 200 each 3   Lancets (ACCU-CHEK MULTICLIX) lancets USE TO TEST BLOOD GLUCOSE TWICE DAILY 200 each 3   levothyroxine (SYNTHROID) 50 MCG tablet TAKE 1 TABLET BY MOUTH EVERY DAY 30 tablet 0   lisinopril (ZESTRIL) 40 MG tablet Take 1 tablet (40 mg total) by mouth daily. 90 tablet 3   Multiple Vitamin (MULTIVITAMIN  WITH MINERALS) TABS Take 1 tablet by mouth daily.     No current facility-administered medications on file prior to visit.    BP 130/86 (BP Location: Left Arm, Patient Position: Sitting, Cuff Size: Normal)    Pulse 89    Temp 98.8 F (37.1 C) (Oral)    Wt 186 lb 12.8 oz (84.7 kg)    LMP 05/17/2016    SpO2 99%    BMI 30.15 kg/m       Objective:   Physical Exam Vitals and nursing note reviewed.  Constitutional:      Appearance: Normal appearance.  Cardiovascular:     Rate and Rhythm: Normal rate and regular rhythm.     Pulses: Normal pulses.     Heart sounds: Normal heart sounds.  Pulmonary:     Breath sounds: Normal breath sounds.  Musculoskeletal:        General: Normal range of motion.  Skin:    General: Skin is warm and dry.  Neurological:     General: No focal deficit present.     Mental Status: She is alert and oriented to person, place, and time.  Psychiatric:        Mood and Affect: Mood normal.        Behavior: Behavior normal.        Thought Content: Thought content normal.        Judgment: Judgment normal.      Assessment & Plan:  1. Diabetes 1.5, managed as type 2 ([place])  - POC HgB A1c- 7.5- has improved  - Follow up in one month or sooner if needed  - tirzepatide (MOUNJARO) 7.5 MG/0.5ML Pen; Inject 7.5 mg into the skin once a week.  Dispense: 2 mL; Refill: 0  2. Essential hypertension - Controlled.  - Continue with current medications   Dorothyann Peng, NP

## 2021-04-20 NOTE — ED Triage Notes (Addendum)
2 hours ago began having epigastric pain and nausea, "felt like labor pains." Never had pain like this before. Hx of diabetes, glucose 257 this morning which she said it's never usually that high. Denies hx of heart problems or radiation of pain. Took ginger ale to try and help without relief. Called paramedics who assessed her and got vitals, did not do EKG. Pain now a 2/10, has eased off since it started, now reports it as feeling sore.

## 2021-04-20 NOTE — ED Provider Notes (Signed)
[facility]    CSN: [id number] Arrival date & time: 04/20/21  1045      History   Chief Complaint Chief Complaint  Patient presents with   Abdominal Pain    HPI Autumn Acevedo is a 60 y.o. female.   Patient here today for evaluation of epigastric pain and nausea that started about 2 hours ago.  She states that pain was spasmodic initially and then became an ache.  She states that pain has gradually decreased and is now a 2 out of 10.  She did call paramedics who assessed her and reported vitals.  She has not had an EKG.  Patient reports that her pain is now 2 on a 0-to-10 scale.  She denies any cardiac history.  She does have history of diabetes.  The history is provided by the patient.  Abdominal Pain Associated symptoms: nausea, vaginal bleeding and vaginal discharge   Associated symptoms: no chills, no diarrhea, no fever, no shortness of breath and no vomiting    Past Medical History:  Diagnosis Date   Allergy    Diabetes mellitus without complication ([address])    Type2    Goiter    Hypertension    Hypothyroid    ITP (idiopathic thrombocytopenic purpura)     Patient Active Problem List   Diagnosis Date Noted   Reactive airway disease that is not asthma 03/24/2015   Routine general medical examination at a health care facility 09/10/2012   ITP (idiopathic thrombocytopenic purpura) 08/27/2012   Lumbar disc disease with radiculopathy 08/20/2012   Diabetes 1.5, managed as type 2 ([place]) 07/29/2012   Hypothyroidism 07/19/2012   Essential hypertension 04/27/2008   Goiter 04/24/2007   Obesity 04/24/2007   IRITIS 04/24/2007   ALLERGIC RHINITIS 04/24/2007   DRY SKIN 04/24/2007    Past Surgical History:  Procedure Laterality Date   no surgeries at this time      OB History   No obstetric history on file.      Home Medications    Prior to Admission medications   Medication Sig Start Date End Date Taking? Authorizing Provider  albuterol (VENTOLIN  HFA) 108 (90 Base) MCG/ACT inhaler INHALE 2 PUFFS INTO THE LUNGS EVERY 6 (SIX) HOURS AS NEEDED FOR WHEEZING. 05/07/20   Nafziger, Tommi Rumps, NP  amLODipine (NORVASC) 2.5 MG tablet Take 1 tablet (2.5 mg total) by mouth daily. 02/09/21   Nafziger, Tommi Rumps, NP  BLACK COHOSH PO Take by mouth.    [provider]  glipiZIDE (GLUCOTROL XL) 10 MG 24 hr tablet TAKE 1 TABLET (10 MG TOTAL) BY MOUTH IN THE MORNING AND AT BEDTIME. 02/15/21 05/16/21  Nafziger, Tommi Rumps, NP  glucose blood (ACCU-CHEK GUIDE) test strip USE TO TEST BLOOD GLUCOSE TWICE DAILY 03/16/21   Nafziger, Tommi Rumps, NP  Lancets (ACCU-CHEK MULTICLIX) lancets USE TO TEST BLOOD GLUCOSE TWICE DAILY 03/16/21   Nafziger, Tommi Rumps, NP  levothyroxine (SYNTHROID) 50 MCG tablet TAKE 1 TABLET BY MOUTH EVERY DAY 02/16/21   Nafziger, Tommi Rumps, NP  lisinopril (ZESTRIL) 40 MG tablet Take 1 tablet (40 mg total) by mouth daily. 01/07/21   Nafziger, Tommi Rumps, NP  Multiple Vitamin (MULTIVITAMIN WITH MINERALS) TABS Take 1 tablet by mouth daily.    [provider]  tirzepatide Darcel Bayley) 7.5 MG/0.5ML Pen Inject 7.5 mg into the skin once a week. 04/20/21   Dorothyann Peng, NP    Family History Family History  Problem Relation Age of Onset   Hypertension Mother        fhx  COPD Father    Lung cancer Father    Pancreatic cancer Brother    Colon cancer Sister     Social History Social History   Tobacco Use   Smoking status: Never   Smokeless tobacco: Never   Tobacco comments:    never used tobacco  Vaping Use   Vaping Use: Never used  Substance Use Topics   Alcohol use: Yes    Alcohol/week: 1.0 standard drink    Types: 1 Standard drinks or equivalent per week    Comment: 2-3 drinks a week    Drug use: No     Allergies   Patient has no known allergies.   Review of Systems Review of Systems  Constitutional:  Negative for chills and fever.  Eyes:  Negative for discharge and redness.  Respiratory:  Negative for shortness of breath.   Gastrointestinal:   Positive for abdominal pain and nausea. Negative for diarrhea and vomiting.  Genitourinary:  Positive for vaginal bleeding and vaginal discharge.    Physical Exam Triage Vital Signs ED Triage Vitals  Enc Vitals Group     BP 04/20/21 1202 (!) 157/95     Pulse Rate 04/20/21 1202 (!) 106     Resp 04/20/21 1202 16     Temp 04/20/21 1202 98 F (36.7 C)     Temp Source 04/20/21 1202 Oral     SpO2 04/20/21 1202 96 %     Weight --      Height --      Head Circumference --      Peak Flow --      Pain Score 04/20/21 1203 2     Pain Loc --      Pain Edu? --      Excl. in [place]? --    No data found.  Updated Vital Signs BP (!) 157/95 (BP Location: Left Arm)    Pulse (!) 106    Temp 98 F (36.7 C) (Oral)    Resp 16    LMP 05/17/2016    SpO2 96%   Physical Exam Vitals and nursing note reviewed.  Constitutional:      General: She is not in acute distress.    Appearance: Normal appearance. She is not ill-appearing.  HENT:     Head: Normocephalic and atraumatic.  Eyes:     Conjunctiva/sclera: Conjunctivae normal.  Cardiovascular:     Rate and Rhythm: Normal rate and regular rhythm.     Heart sounds: Normal heart sounds. No murmur heard. Pulmonary:     Effort: Pulmonary effort is normal. No respiratory distress.     Breath sounds: Normal breath sounds. No wheezing, rhonchi or rales.  Abdominal:     General: Abdomen is flat. Bowel sounds are normal. There is no distension.     Palpations: Abdomen is soft.     Tenderness: There is abdominal tenderness (mild TTP to epigastric area). There is no guarding or rebound.  Neurological:     Mental Status: She is alert.  Psychiatric:        Mood and Affect: Mood normal.        Behavior: Behavior normal.        Thought Content: Thought content normal.     UC Treatments / Results  Labs (all labs ordered are listed, but only abnormal results are displayed) Labs Reviewed  CBC WITH DIFFERENTIAL/PLATELET  COMPREHENSIVE METABOLIC PANEL   AMYLASE  LIPASE    EKG   Radiology No results found.  Procedures Procedures (  including critical care time)  Medications Ordered in UC Medications - No data to display  Initial Impression / Assessment and Plan / UC Course  I have reviewed the triage vital signs and the nursing notes.  Pertinent labs & imaging results that were available during my care of the patient were reviewed by me and considered in my medical decision making (see chart for details).    EKG with NSR. Labs ordered for further evaluation. Recommended she report to ED if symptoms return or worsen.  Patient expresses understanding.    Final Clinical Impressions(s) / UC Diagnoses   Final diagnoses:  Abdominal pain, epigastric   Discharge Instructions   None    ED Prescriptions   None    PDMP not reviewed this encounter.   Francene Finders, PA-C 04/20/21 1320

## 2021-04-21 LAB — COMPREHENSIVE METABOLIC PANEL
ALT: 20 IU/L (ref 0–32)
AST: 14 IU/L (ref 0–40)
Albumin/Globulin Ratio: 1.9 (ref 1.2–2.2)
Albumin: 4.8 g/dL (ref 3.8–4.9)
Alkaline Phosphatase: 146 IU/L — ABNORMAL HIGH (ref 44–121)
BUN/Creatinine Ratio: 13 (ref 12–28)
BUN: 10 mg/dL (ref 8–27)
Bilirubin Total: 0.3 mg/dL (ref 0.0–1.2)
CO2: 24 mmol/L (ref 20–29)
Calcium: 9.5 mg/dL (ref 8.7–10.3)
Chloride: 105 mmol/L (ref 96–106)
Creatinine, Ser: 0.8 mg/dL (ref 0.57–1.00)
Globulin, Total: 2.5 g/dL (ref 1.5–4.5)
Glucose: 197 mg/dL — ABNORMAL HIGH (ref 70–99)
Potassium: 4.5 mmol/L (ref 3.5–5.2)
Sodium: 141 mmol/L (ref 134–144)
Total Protein: 7.3 g/dL (ref 6.0–8.5)
eGFR: 84 mL/min/{1.73_m2} (ref >59)

## 2021-04-21 LAB — CBC WITH DIFFERENTIAL/PLATELET
Basophils Absolute: 0 10*3/uL (ref 0.0–0.2)
Basos: 0 %
EOS (ABSOLUTE): 0.1 10*3/uL (ref 0.0–0.4)
Eos: 1 %
Hematocrit: 43.3 % (ref 34.0–46.6)
Hemoglobin: 13.5 g/dL (ref 11.1–15.9)
Immature Grans (Abs): 0 10*3/uL (ref 0.0–0.1)
Immature Granulocytes: 1 %
Lymphocytes Absolute: 1.8 10*3/uL (ref 0.7–3.1)
Lymphs: 35 %
MCH: 23.2 pg — ABNORMAL LOW (ref 26.6–33.0)
MCHC: 31.2 g/dL — ABNORMAL LOW (ref 31.5–35.7)
MCV: 74 fL — ABNORMAL LOW (ref 79–97)
Monocytes Absolute: 0.3 10*3/uL (ref 0.1–0.9)
Monocytes: 6 %
Neutrophils Absolute: 3 10*3/uL (ref 1.4–7.0)
Neutrophils: 57 %
Platelets: 276 10*3/uL (ref 150–450)
RBC: 5.83 x10E6/uL — ABNORMAL HIGH (ref 3.77–5.28)
RDW: 15.2 % (ref 11.7–15.4)
WBC: 5.2 10*3/uL (ref 3.4–10.8)

## 2021-04-21 LAB — AMYLASE: Amylase: 104 U/L (ref 31–110)

## 2021-04-21 LAB — LIPASE: Lipase: 52 U/L (ref 14–72)

## 2021-04-25 ENCOUNTER — Emergency Department (HOSPITAL_COMMUNITY)
Admission: EM | Admit: 2021-04-25 | Discharge: 2021-04-25 | Disposition: A | Discharge status: Home / Self Care | Payer: Managed Care, Other (non HMO) | Attending: Emergency Medicine | Admitting: Emergency Medicine

## 2021-04-25 ENCOUNTER — Encounter (HOSPITAL_COMMUNITY): Payer: Self-pay | Admitting: Emergency Medicine

## 2021-04-25 ENCOUNTER — Other Ambulatory Visit: Payer: Self-pay

## 2021-04-25 DIAGNOSIS — Z7984 Long term (current) use of oral hypoglycemic drugs: Secondary | ICD-10-CM | POA: Insufficient documentation

## 2021-04-25 DIAGNOSIS — E039 Hypothyroidism, unspecified: Secondary | ICD-10-CM | POA: Diagnosis not present

## 2021-04-25 DIAGNOSIS — E119 Type 2 diabetes mellitus without complications: Secondary | ICD-10-CM | POA: Diagnosis not present

## 2021-04-25 DIAGNOSIS — Z79899 Other long term (current) drug therapy: Secondary | ICD-10-CM | POA: Insufficient documentation

## 2021-04-25 DIAGNOSIS — I10 Essential (primary) hypertension: Secondary | ICD-10-CM | POA: Insufficient documentation

## 2021-04-25 DIAGNOSIS — R1013 Epigastric pain: Secondary | ICD-10-CM

## 2021-04-25 LAB — TROPONIN I (HIGH SENSITIVITY): Troponin I (High Sensitivity): 3 ng/L (ref <18)

## 2021-04-25 LAB — COMPREHENSIVE METABOLIC PANEL
ALT: 21 U/L (ref 0–44)
AST: 19 U/L (ref 15–41)
Albumin: 4.2 g/dL (ref 3.5–5.0)
Alkaline Phosphatase: 81 U/L (ref 38–126)
Anion gap: 11 (ref 5–15)
BUN: 10 mg/dL (ref 6–20)
CO2: 26 mmol/L (ref 22–32)
Calcium: 9.1 mg/dL (ref 8.9–10.3)
Chloride: 102 mmol/L (ref 98–111)
Creatinine, Ser: 1.08 mg/dL — ABNORMAL HIGH (ref 0.44–1.00)
GFR, Estimated: 59 mL/min — ABNORMAL LOW (ref >60)
Glucose, Bld: 191 mg/dL — ABNORMAL HIGH (ref 70–99)
Potassium: 3.9 mmol/L (ref 3.5–5.1)
Sodium: 139 mmol/L (ref 135–145)
Total Bilirubin: 1.4 mg/dL — ABNORMAL HIGH (ref 0.3–1.2)
Total Protein: 7.5 g/dL (ref 6.5–8.1)

## 2021-04-25 LAB — CBC
HCT: 47.6 % — ABNORMAL HIGH (ref 36.0–46.0)
Hemoglobin: 14.9 g/dL (ref 12.0–15.0)
MCH: 23.4 pg — ABNORMAL LOW (ref 26.0–34.0)
MCHC: 31.3 g/dL (ref 30.0–36.0)
MCV: 74.7 fL — ABNORMAL LOW (ref 80.0–100.0)
Platelets: 300 10*3/uL (ref 150–400)
RBC: 6.37 MIL/uL — ABNORMAL HIGH (ref 3.87–5.11)
RDW: 14.6 % (ref 11.5–15.5)
WBC: 7.1 10*3/uL (ref 4.0–10.5)
nRBC: 0 % (ref 0.0–0.2)

## 2021-04-25 LAB — LIPASE, BLOOD: Lipase: 24 U/L (ref 11–51)

## 2021-04-25 MED ORDER — FAMOTIDINE IN NACL 20-0.9 MG/50ML-% IV SOLN
20.0000 mg | Freq: Once | INTRAVENOUS | Status: AC
  Administered 2021-04-25: 20 mg via INTRAVENOUS

## 2021-04-25 MED ORDER — LIDOCAINE VISCOUS HCL 2 % MT SOLN
15.0000 mL | Freq: Once | OROMUCOSAL | Status: AC
  Administered 2021-04-25: 15 mL via ORAL
  Filled 2021-04-25: qty 15

## 2021-04-25 MED ORDER — ALUM & MAG HYDROXIDE-SIMETH 200-200-20 MG/5ML PO SUSP
30.0000 mL | Freq: Once | ORAL | Status: AC
  Administered 2021-04-25: 30 mL via ORAL
  Filled 2021-04-25: qty 30

## 2021-04-25 MED ORDER — SODIUM CHLORIDE 0.9 % IV BOLUS
500.0000 mL | Freq: Once | INTRAVENOUS | Status: AC
  Administered 2021-04-25: 500 mL via INTRAVENOUS

## 2021-04-25 MED ORDER — ONDANSETRON 4 MG PO TBDP
4.0000 mg | ORAL_TABLET | Freq: Once | ORAL | Status: AC
  Administered 2021-04-25: 4 mg via ORAL
  Filled 2021-04-25: qty 1

## 2021-04-25 MED ORDER — OMEPRAZOLE 20 MG PO CPDR
20.0000 mg | DELAYED_RELEASE_CAPSULE | Freq: Every day | ORAL | 0 refills | Status: DC
Start: 2021-04-25 — End: 2021-05-24

## 2021-04-25 NOTE — ED Provider Notes (Signed)
[facility] EMERGENCY DEPARTMENT Provider Note   CSN: [id number] Arrival date & time: 04/25/21  4920     History  Chief Complaint  Patient presents with   Abdominal Pain    Autumn Acevedo is a 61 y.o. female with a PMHx of HTN, DM, hypothyroid who presents to the ED complaining of epigastric, burning sensation, 3/10, non-radiating abdominal pain onset 6 PM yesterday.  At its worst her epigastric abdominal pain is 12/10.  Patient has a history of similar symptoms last week, however, pain resolved on its own.  Denies sick contacts. Pt reports associated nausea and nonbloody emesis (1 episode yesterday). Pt has tried Tums yesterday with no relief of her symptoms. She denies constipation, diarrhea, fever, chills, dysuria, hematuria, vaginal bleeding, vaginal discharge, chest pain, shortness of breath and any other symptoms. Denies allergies to any medications. Denies excessive NSAID use, alcohol use, MI, CAD, liver concerns, pancreatitis.  Denies history of GERD or recent endoscopy. Denies any new suspicious foods or new medications. She does not smoke. Denies recent immobilization/surgery, HRT, OCP, history of malignancy, DVT/PE. Pt works as a Workers Psychologist, sport and exercise.   Per patient chart review: Patient was evaluated in urgent care on 04/20/21 with a negative work-up.  Patient was not given any prescription medications at that time.  However patient notes she was told that she could have a stomach ulcer.   Past Medical History:  Diagnosis Date   Allergy    Diabetes mellitus without complication (H[facility])    Type2    Goiter    Hypertension    Hypothyroid    ITP (idiopathic thrombocytopenic purpura)      The history is provided by the patient. No language interpreter was used.  Abdominal Pain Pain location:  Epigastric Pain quality: burning   Pain radiates to:  Does not radiate Pain severity:  Mild Onset quality:  Gradual Duration:  1 day Timing:   Constant Progression:  Unchanged Chronicity:  Recurrent Context: not alcohol use, not diet changes, not recent illness, not sick contacts and not suspicious food intake   Relieved by:  Nothing Worsened by:  Nothing Ineffective treatments:  None tried Associated symptoms: nausea   Associated symptoms: no chest pain, no chills, no constipation, no diarrhea, no fever, no hematuria, no shortness of breath, no vaginal bleeding, no vaginal discharge and no vomiting   Risk factors: no alcohol abuse, no NSAID use and no recent hospitalization       Home Medications Prior to Admission medications   Medication Sig Start Date End Date Taking? Authorizing Provider  omeprazole (PRILOSEC) 20 MG capsule Take 1 capsule (20 mg total) by mouth daily. 04/25/21  Yes Kirubel Aja A, PA-C  albuterol (VENTOLIN HFA) 108 (90 Base) MCG/ACT inhaler INHALE 2 PUFFS INTO THE LUNGS EVERY 6 (SIX) HOURS AS NEEDED FOR WHEEZING. 05/07/20   Nafziger, Tommi Rumps, NP  amLODipine (NORVASC) 2.5 MG tablet Take 1 tablet (2.5 mg total) by mouth daily. 02/09/21   Nafziger, Tommi Rumps, NP  BLACK COHOSH PO Take by mouth.    [provider]  glipiZIDE (GLUCOTROL XL) 10 MG 24 hr tablet TAKE 1 TABLET (10 MG TOTAL) BY MOUTH IN THE MORNING AND AT BEDTIME. 02/15/21 05/16/21  Nafziger, Tommi Rumps, NP  glucose blood (ACCU-CHEK GUIDE) test strip USE TO TEST BLOOD GLUCOSE TWICE DAILY 03/16/21   Nafziger, Tommi Rumps, NP  Lancets (ACCU-CHEK MULTICLIX) lancets USE TO TEST BLOOD GLUCOSE TWICE DAILY 03/16/21   Nafziger, Tommi Rumps, NP  levothyroxine (SYNTHROID) 50 MCG tablet  TAKE 1 TABLET BY MOUTH EVERY DAY 02/16/21   Nafziger, Tommi Rumps, NP  lisinopril (ZESTRIL) 40 MG tablet Take 1 tablet (40 mg total) by mouth daily. 01/07/21   Nafziger, Tommi Rumps, NP  Multiple Vitamin (MULTIVITAMIN WITH MINERALS) TABS Take 1 tablet by mouth daily.    [provider]  tirzepatide Darcel Bayley) 7.5 MG/0.5ML Pen Inject 7.5 mg into the skin once a week. 04/20/21   Dorothyann Peng, NP       Allergies    Patient has no known allergies.    Review of Systems   Review of Systems  Constitutional:  Negative for chills and fever.  Respiratory:  Negative for shortness of breath.   Cardiovascular:  Negative for chest pain.  Gastrointestinal:  Positive for abdominal pain and nausea. Negative for constipation, diarrhea and vomiting.  Genitourinary:  Negative for hematuria, vaginal bleeding and vaginal discharge.  All other systems reviewed and are negative.  Physical Exam Updated Vital Signs BP 133/85    Pulse 96    Temp 99.8 F (37.7 C) (Oral)    Resp 18    Ht $R'5\' 6"'sy$  (1.676 m)    Wt 84.4 kg    LMP 05/17/2016    SpO2 100%    BMI 30.02 kg/m  Physical Exam Vitals and nursing note reviewed.  Constitutional:      General: She is not in acute distress.    Appearance: She is not diaphoretic.  HENT:     Head: Normocephalic and atraumatic.     Nose: Nose normal.     Mouth/Throat:     Mouth: Mucous membranes are moist.     Pharynx: Oropharynx is clear. No oropharyngeal exudate.  Eyes:     General: No scleral icterus.    Conjunctiva/sclera: Conjunctivae normal.  Cardiovascular:     Rate and Rhythm: Normal rate and regular rhythm.     Pulses: Normal pulses.     Heart sounds: Normal heart sounds.  Pulmonary:     Effort: Pulmonary effort is normal. No respiratory distress.     Breath sounds: Normal breath sounds. No wheezing.  Chest:     Chest wall: No tenderness.     Comments: No chest wall tenderness to palpation.  Abdominal:     General: Bowel sounds are normal.     Palpations: Abdomen is soft. There is no mass.     Tenderness: There is abdominal tenderness in the epigastric area. There is no right CVA tenderness, left CVA tenderness, guarding or rebound.     Comments: Epigastric tenderness to palpation.  No CVA tenderness bilaterally.  Musculoskeletal:        General: Normal range of motion.     Cervical back: Normal range of motion and neck supple.     Comments:  Strength and sensation intact to bilateral upper and lower extremities.  Skin:    General: Skin is warm and dry.  Neurological:     Mental Status: She is alert.  Psychiatric:        Behavior: Behavior normal.    ED Results / Procedures / Treatments   Labs (all labs ordered are listed, but only abnormal results are displayed) Labs Reviewed  COMPREHENSIVE METABOLIC PANEL - Abnormal; Notable for the following components:      Result Value   Glucose, Bld 191 (*)    Creatinine, Ser 1.08 (*)    Total Bilirubin 1.4 (*)    GFR, Estimated 59 (*)    All other components within normal limits  CBC -  Abnormal; Notable for the following components:   RBC 6.37 (*)    HCT 47.6 (*)    MCV 74.7 (*)    MCH 23.4 (*)    All other components within normal limits  LIPASE, BLOOD  URINALYSIS, ROUTINE W REFLEX MICROSCOPIC  TROPONIN I (HIGH SENSITIVITY)  TROPONIN I (HIGH SENSITIVITY)    EKG EKG Interpretation  Date/Time:  Monday April 25 2021 07:17:00 EST Ventricular Rate:  121 PR Interval:  136 QRS Duration: 88 QT Interval:  332 QTC Calculation: 471 R Axis:   69 Text Interpretation: Sinus tachycardia Minimal voltage criteria for LVH, may be normal variant ( Cornell product ) Borderline ECG When compared with ECG of 20-Apr-2021 12:10, PREVIOUS ECG IS PRESENT Confirmed by Lavenia Atlas [phone]) on 04/25/2021 8:05:45 AM  Radiology No results found.  Procedures Procedures    Medications Ordered in ED Medications  alum & mag hydroxide-simeth (MAALOX/MYLANTA) 200-200-20 MG/5ML suspension 30 mL (30 mLs Oral Given 04/25/21 0827)    And  lidocaine (XYLOCAINE) 2 % viscous mouth solution 15 mL (15 mLs Oral Given 04/25/21 0827)  famotidine (PEPCID) IVPB 20 mg premix (0 mg Intravenous Stopped 04/25/21 0856)  ondansetron (ZOFRAN-ODT) disintegrating tablet 4 mg (4 mg Oral Given 04/25/21 0827)  sodium chloride 0.9 % bolus 500 mL (500 mLs Intravenous New Bag/Given 04/25/21 7846)    ED Course/ Medical  Decision Making/ A&P Clinical Course as of 04/25/21 1017  Mon Apr 25, 2021  0902 Pt re-evaluated and resting comfortably on stretcher.  Patient reports improvement of her symptoms to 0/10 following GI cocktail. Will order 500 ml IVF to aid with mild tachycardia [SB]  1015 Patient reevaluated prior to discharge and noted heart rate decreased with IV fluid bolus in the ED.  Patient appears safe for discharge at this time.  Patient agreeable to discharge treatment plan. [SB]    Clinical Course User Index [SB] Brexton Sofia A, PA-C                           Medical Decision Making  This patient presents to the ED for concern of epigastric abdominal pain, this involves an extensive number of treatment options, and is a complaint that carries with it a high risk of complications and morbidity.  The differential diagnosis includes pancreatitis, MI, cholecystitis, GERD.   Co morbidities that complicate the patient evaluation  Hypertension, diabetes  Additional history obtained: Additional history obtained from  External records from outside source obtained and reviewed including urgent care visit on 04/20/2021.  Patient was evaluated at urgent care for epigastric abdominal pain.  At that time patient had labs obtained that were notable for negative lipase and amylase.  CMP with elevated Alk phos at 146 and elevated glucose of 127. CBC unremarkable.   Lab Tests: I ordered, and personally interpreted labs.  The pertinent results include: Troponin within normal limits, lipase unremarkable.  CBC without elevated WBC.  CMP without elevated LFTs.  Mild elevation in creatinine, likely mild AKI. Doubt MI at this time, EKG without ST/T changes and troponins without elevation.    Cardiac Monitoring: The patient was maintained on a cardiac monitor.  I personally viewed and interpreted the cardiac monitored which showed an underlying rhythm of: Sinus tachycardia.    Medicines ordered and prescription drug  management: I ordered medication including Pepcid, Maalox, lidocaine, Zofran, for indigestion, likely GERD in nature.  IV fluids given for elevated heart rate and mild AKI. Reevaluation of the  patient after these medicines showed that the patient improved I have reviewed the patients home medicines and have made adjustments as needed.    Problem List / ED Course: Clinical Course as of 04/25/21 1020  Mon Apr 25, 2021  0902 Pt re-evaluated and resting comfortably on stretcher.  Patient reports improvement of her symptoms to 0/10 following GI cocktail. Will order 500 ml IVF to aid with mild tachycardia [SB]  1015 Patient reevaluated prior to discharge and noted heart rate decreased with IV fluid bolus in the ED.  Patient appears safe for discharge at this time.  Patient agreeable to discharge treatment plan. [SB]    Clinical Course User Index [SB] Margean Korell A, PA-C     Reevaluation: After the interventions noted above, I reevaluated the patient and found that they have :resolved    Social Determinants of Health: Patient is Market researcher. insurance agent.   Dispostion: After consideration of the diagnostic results and the patients response to treatment, I feel that the patent would benefit from discharge home with Prilosec prescription, GI referral, PCP follow-up.  Patient presentation likely due to GERD due to symptoms being completely resolved with GI cocktail in the ED. Patient acknowledges and verbalized understanding.  Patient appears safe for discharge at this time.  Follow-up as indicated in discharge paperwork.  This chart was dictated using voice recognition software, Dragon. Despite the best efforts of this provider to proofread and correct errors, errors may still occur which can change documentation meaning.  Final Clinical Impression(s) / ED Diagnoses Final diagnoses:  Epigastric abdominal pain    Rx / DC Orders ED Discharge Orders          Ordered    omeprazole  (PRILOSEC) 20 MG capsule  Daily        04/25/21 1014              Rilley Stash A, PA-C 04/25/21 1020    [place]orton, [place], DO 04/25/21 1545

## 2021-04-25 NOTE — Discharge Instructions (Addendum)
It was my pleasure taking care of you today!    Your work-up was negative in the ED.  Your symptoms were alleviated with the medications given in the ED.  You will be sent home a prescription for Prilosec, take as prescribed.  Attached is information on recommended foods to aid with your symptoms.  You may follow-up with your primary care provider as needed.  Attached you will find information for the on-call gastroenterologist, call them to set up a follow-up appointment regarding today's ED visit.  Return to the ED if you are experiencing increasing/worsening abdominal pain, vomiting, inability to keep fluids down, or worsening symptoms.

## 2021-04-25 NOTE — ED Triage Notes (Signed)
Patient coming from home, complaint of abdominal pain that began approximately 1800 yesterday. Pt states has same last week but pain went away on its own.

## 2021-04-28 ENCOUNTER — Encounter: Payer: Self-pay | Admitting: Adult Health

## 2021-04-28 NOTE — Telephone Encounter (Signed)
Please advise 

## 2021-05-12 ENCOUNTER — Other Ambulatory Visit: Payer: Self-pay | Admitting: Adult Health

## 2021-05-12 DIAGNOSIS — I10 Essential (primary) hypertension: Secondary | ICD-10-CM

## 2021-05-12 DIAGNOSIS — Z760 Encounter for issue of repeat prescription: Secondary | ICD-10-CM

## 2021-05-24 ENCOUNTER — Ambulatory Visit (INDEPENDENT_AMBULATORY_CARE_PROVIDER_SITE_OTHER): Payer: Managed Care, Other (non HMO) | Admitting: Adult Health

## 2021-05-24 ENCOUNTER — Encounter: Payer: Self-pay | Admitting: Adult Health

## 2021-05-24 VITALS — BP 128/78 | HR 82 | Temp 98.6°F | Ht 66.0 in | Wt 180.0 lb

## 2021-05-24 DIAGNOSIS — E139 Other specified diabetes mellitus without complications: Secondary | ICD-10-CM | POA: Diagnosis not present

## 2021-05-24 MED ORDER — TIRZEPATIDE 10 MG/0.5ML ~~LOC~~ SOAJ: 10.0000 mg | SUBCUTANEOUS | 0 refills | Status: DC

## 2021-05-24 MED ORDER — OMEPRAZOLE 20 MG PO CPDR: 20.0000 mg | DELAYED_RELEASE_CAPSULE | Freq: Every day | ORAL | 1 refills | Status: AC

## 2021-05-24 NOTE — Telephone Encounter (Signed)
Please advise 

## 2021-05-24 NOTE — Patient Instructions (Signed)
It was great seeing you today   I have increased your dose to 10 mg weekly.   Follow up in two months for your A1c check

## 2021-05-24 NOTE — Progress Notes (Signed)
Subjective:    Patient ID: Autumn Acevedo, female    DOB: December 11, 1960, 61 y.o.   MRN: [id number]  HPI 61 year old female who  has a past medical history of Allergy, Diabetes mellitus without complication ([place]), Goiter, Hypertension, Hypothyroid, and ITP (idiopathic thrombocytopenic purpura).  She presents to the office today for follow-up regarding diabetes mellitus.  Diabetes mellitus-her blood sugars have been hard to control in the past.  She is currently maintained with glipizide 10 mg extended release and Mounjaro 7.5 mg weekly.  She has been tolerating medication well, has a little constipation since starting the 7.5 mg dose.   Her portion sizes have decreased and cravings.  She does monitor her blood sugars and reports readings between 78 - 142, but most of her blood sugars have been in the 1teens.  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 ([place])    Type2    Goiter    Hypertension    Hypothyroid    ITP (idiopathic thrombocytopenic purpura)     Social History   Socioeconomic History   Marital status: Single    Spouse name: Not on file   Number of children: Not on file   Years of education: Not on file   Highest education level: Not on file  Occupational History   Not on file  Tobacco Use   Smoking status: Never   Smokeless tobacco: Never   Tobacco comments:    never used tobacco  Vaping Use   Vaping Use: Never used  Substance and Sexual Activity   Alcohol use: Yes    Alcohol/week: 1.0 standard drink    Types: 1 Standard drinks or equivalent per week    Comment: 2-3 drinks a week    Drug use: No   Sexual activity: Not on file  Other Topics Concern   Not on file  Social History Narrative    Works for an IT consultant    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 Has two grand babies   She enjoys reading and going to the gym. Hanging out with friends.       Social Determinants of Health   Financial Resource Strain: Not on file  Food Insecurity: Not on file  Transportation Needs: Not on file  Physical Activity: Not on file  Stress: Not on file  Social Connections: Not on file  Intimate Partner Violence: Not on file    Past Surgical History:  Procedure Laterality Date   no surgeries at this time      Family History  Problem Relation Age of Onset   Hypertension Mother        fhx   COPD Father    Lung cancer Father    Pancreatic cancer Brother    Colon cancer Sister     No Known Allergies  Current Outpatient Medications on File Prior to Visit  Medication Sig Dispense Refill   albuterol (VENTOLIN HFA) 108 (90 Base) MCG/ACT inhaler INHALE 2 PUFFS INTO THE LUNGS EVERY 6 (SIX) HOURS AS NEEDED FOR WHEEZING. 18 each 2   amLODipine (NORVASC) 2.5 MG tablet Take  1 tablet (2.5 mg total) by mouth daily. 90 tablet 1   BLACK COHOSH PO Take by mouth.     glipiZIDE (GLUCOTROL XL) 10 MG 24 hr tablet TAKE 1 TABLET (10 MG TOTAL) BY MOUTH IN THE MORNING AND AT BEDTIME. 180 tablet 0   glucose blood (ACCU-CHEK GUIDE) test strip USE TO TEST BLOOD GLUCOSE TWICE DAILY 200 each 3   Lancets (ACCU-CHEK MULTICLIX) lancets USE TO TEST BLOOD GLUCOSE TWICE DAILY 200 each 3   levothyroxine (SYNTHROID) 50 MCG tablet Take 1 tablet (50 mcg total) by mouth daily. 90 tablet 1   lisinopril (ZESTRIL) 40 MG tablet Take 1 tablet (40 mg total) by mouth daily. 90 tablet 3   Multiple Vitamin (MULTIVITAMIN WITH MINERALS) TABS Take 1 tablet by mouth daily.     omeprazole (PRILOSEC) 20 MG capsule Take 1 capsule (20 mg total) by mouth daily. 30 capsule 0   tirzepatide (MOUNJARO) 7.5 MG/0.5ML Pen Inject 7.5 mg into the skin once a week. 2 mL 0   No current facility-administered  medications on file prior to visit.    BP 128/78    Pulse 82    Temp 98.6 F (37 C) (Oral)    Ht 5\' 6"  (1.676 m)    Wt 180 lb (81.6 kg)    LMP 05/17/2016    SpO2 100%    BMI 29.05 kg/m       Objective:   Physical Exam Vitals and nursing note reviewed.  Constitutional:      General: She is not in acute distress.    Appearance: Normal appearance. She is well-developed. She is not ill-appearing.  HENT:     Head: Normocephalic and atraumatic.     Right Ear: Tympanic membrane, ear canal and external ear normal. There is no impacted cerumen.     Left Ear: Tympanic membrane, ear canal and external ear normal. There is no impacted cerumen.     Nose: Nose normal. No congestion or rhinorrhea.     Mouth/Throat:     Mouth: Mucous membranes are moist.     Pharynx: Oropharynx is clear. No oropharyngeal exudate or posterior oropharyngeal erythema.  Eyes:     General:        Right eye: No discharge.        Left eye: No discharge.     Extraocular Movements: Extraocular movements intact.     Conjunctiva/sclera: Conjunctivae normal.     Pupils: Pupils are equal, round, and reactive to light.  Neck:     Thyroid: No thyromegaly.     Vascular: No carotid bruit.     Trachea: No tracheal deviation.  Cardiovascular:     Rate and Rhythm: Normal rate and regular rhythm.     Pulses: Normal pulses.     Heart sounds: Normal heart sounds. No murmur heard.   No friction rub. No gallop.  Pulmonary:     Effort: Pulmonary effort is normal. No respiratory distress.     Breath sounds: Normal breath sounds. No stridor. No wheezing, rhonchi or rales.  Chest:     Chest wall: No tenderness.  Abdominal:     General: Abdomen is flat. Bowel sounds are normal. There is no distension.     Palpations: Abdomen is soft. There is no mass.     Tenderness: There is no abdominal tenderness. There is no right CVA tenderness, left CVA tenderness, guarding or rebound.     Hernia: No hernia is present.  Musculoskeletal:  General: No swelling, tenderness, deformity or signs of injury. Normal range of motion.     Cervical back: Normal range of motion and neck supple.     Right lower leg: No edema.     Left lower leg: No edema.  Lymphadenopathy:     Cervical: No cervical adenopathy.  Skin:    General: Skin is warm and dry.     Coloration: Skin is not jaundiced or pale.     Findings: No bruising, erythema, lesion or rash.  Neurological:     General: No focal deficit present.     Mental Status: She is alert and oriented to person, place, and time.     Cranial Nerves: No cranial nerve deficit.     Sensory: No sensory deficit.     Motor: No weakness.     Coordination: Coordination normal.     Gait: Gait normal.     Deep Tendon Reflexes: Reflexes normal.  Psychiatric:        Mood and Affect: Mood normal.        Behavior: Behavior normal.        Thought Content: Thought content normal.        Judgment: Judgment normal.      Assessment & Plan:  1. Diabetes 1.5, managed as type 2 ([place]) - Will increase to 10 mg weekly.  - Can add metamucil in the morning to help with constipation  - Follow up in 2 months for A1c check  - tirzepatide (MOUNJARO) 10 MG/0.5ML Pen; Inject 10 mg into the skin once a week.  Dispense: 6 mL; Refill: 0  Dorothyann Peng, NP

## 2021-05-26 ENCOUNTER — Other Ambulatory Visit: Payer: Self-pay | Admitting: Adult Health

## 2021-05-26 DIAGNOSIS — E139 Other specified diabetes mellitus without complications: Secondary | ICD-10-CM

## 2021-05-28 ENCOUNTER — Other Ambulatory Visit: Payer: Self-pay | Admitting: Adult Health

## 2021-05-28 DIAGNOSIS — E139 Other specified diabetes mellitus without complications: Secondary | ICD-10-CM

## 2021-06-02 ENCOUNTER — Other Ambulatory Visit (HOSPITAL_COMMUNITY): Payer: Self-pay

## 2021-06-02 ENCOUNTER — Other Ambulatory Visit: Payer: Self-pay | Admitting: Adult Health

## 2021-06-02 MED ORDER — TIRZEPATIDE 12.5 MG/0.5ML ~~LOC~~ SOAJ
12.5000 mg | SUBCUTANEOUS | 0 refills | Status: DC
  Filled 2021-06-02 – 2021-06-08 (×2): qty 2, 28d supply, fill #0
  Filled 2021-07-04: qty 2, 28d supply, fill #1

## 2021-06-08 ENCOUNTER — Other Ambulatory Visit (HOSPITAL_COMMUNITY): Payer: Self-pay

## 2021-06-22 ENCOUNTER — Other Ambulatory Visit (HOSPITAL_COMMUNITY): Payer: Self-pay

## 2021-07-05 ENCOUNTER — Other Ambulatory Visit: Payer: Self-pay | Admitting: Adult Health

## 2021-07-05 ENCOUNTER — Encounter: Payer: Self-pay | Admitting: Adult Health

## 2021-07-05 ENCOUNTER — Other Ambulatory Visit (HOSPITAL_COMMUNITY): Payer: Self-pay

## 2021-07-05 MED ORDER — TIRZEPATIDE 10 MG/0.5ML ~~LOC~~ SOAJ
10.0000 mg | SUBCUTANEOUS | 0 refills | Status: DC
  Filled 2021-07-05: qty 2, 28d supply, fill #0
  Filled 2021-08-04: qty 2, 28d supply, fill #1
  Filled 2021-08-31 – 2021-09-12 (×2): qty 2, 28d supply, fill #2

## 2021-07-05 NOTE — Telephone Encounter (Signed)
Please advise 

## 2021-07-06 ENCOUNTER — Other Ambulatory Visit (HOSPITAL_COMMUNITY): Payer: Self-pay

## 2021-07-22 ENCOUNTER — Ambulatory Visit (INDEPENDENT_AMBULATORY_CARE_PROVIDER_SITE_OTHER): Payer: Managed Care, Other (non HMO) | Admitting: Adult Health

## 2021-07-22 ENCOUNTER — Encounter: Payer: Self-pay | Admitting: Adult Health

## 2021-07-22 VITALS — BP 122/88 | HR 96 | Temp 98.7°F | Ht 66.0 in | Wt 176.0 lb

## 2021-07-22 DIAGNOSIS — E139 Other specified diabetes mellitus without complications: Secondary | ICD-10-CM | POA: Diagnosis not present

## 2021-07-22 DIAGNOSIS — I10 Essential (primary) hypertension: Secondary | ICD-10-CM | POA: Diagnosis not present

## 2021-07-22 LAB — POCT GLYCOSYLATED HEMOGLOBIN (HGB A1C): Hemoglobin A1C: 6.6 % — AB (ref 4.0–5.6)

## 2021-07-22 NOTE — Progress Notes (Signed)
? ?Subjective:  ? ? Patient ID: Autumn Acevedo, female    DOB: 1960/05/13, 61 y.o.   MRN: [id number] ? ?HPI ?61 year old female who  has a past medical history of Allergy, Diabetes mellitus without complication ([place]), Goiter, Hypertension, Hypothyroid, and ITP (idiopathic thrombocytopenic purpura). ? ?She presents to the office today for follow up regarding DM Type 2 and HTN  She is currently maintained on glipizide 10 mg ER twice daily  and Mounjaro 10 mg weekly.  Her portion sizes have decreased since starting Mounjaro and she tolerates the medication well.  She does monitor her blood sugars at home with readings between 80's and 120's. She is not eating late at night and is working and walking more often.  ?Lab Results  ?Component Value Date  ? HGBA1C 6.6 (A) 07/22/2021  ? ?Wt Readings from Last 3 Encounters:  ?07/22/21 176 lb (79.8 kg)  ?05/24/21 180 lb (81.6 kg)  ?04/25/21 186 lb (84.4 kg)  ? ?HTN -well-controlled with lisinopril 40 mg daily and Norvasc 2.5 mg daily.  She denies dizziness, lightheadedness, chest pain, shortness of breath ?BP Readings from Last 3 Encounters:  ?07/22/21 122/88  ?05/24/21 128/78  ?04/25/21 133/85  ? ?Review of Systems  ?All other systems reviewed and are negative. ?See HPI  ? ?Past Medical History:  ?Diagnosis Date  ? Allergy   ? Diabetes mellitus without complication ([place])   ? Type2   ? Goiter   ? Hypertension   ? Hypothyroid   ? ITP (idiopathic thrombocytopenic purpura)   ? ? ?Social History  ? ?Socioeconomic History  ? Marital status: Single  ?  Spouse name: Not on file  ? Number of children: Not on file  ? Years of education: Not on file  ? Highest education level: Associate degree: occupational, Hotel manager, or vocational program  ?Occupational History  ? Not on file  ?Tobacco Use  ? Smoking status: Never  ? Smokeless tobacco: Never  ? Tobacco comments:  ?  never used tobacco  ?Vaping Use  ? Vaping Use: Never used  ?Substance and Sexual Activity  ? Alcohol use: Yes  ?   Alcohol/week: 1.0 standard drink  ?  Types: 1 Standard drinks or equivalent per week  ?  Comment: 2-3 drinks a week   ? Drug use: No  ? Sexual activity: Not on file  ?Other Topics Concern  ? Not on file  ?Social History Narrative  ? Works for an IT consultant   ? Not married - never been   ? One child ( girl) She lives with her. Has two grand babies  ? She enjoys reading and going to the gym. Hanging out with friends.   ?   ? ?Social Determinants of Health  ? ?Financial Resource Strain: Low Risk   ? Difficulty of Paying Living Expenses: Not hard at all  ?Food Insecurity: No Food Insecurity  ? Worried About Charity fundraiser in the Last Year: Never true  ? Ran Out of Food in the Last Year: Never true  ?Transportation Needs: No Transportation Needs  ? Lack of Transportation (Medical): No  ? Lack of Transportation (Non-Medical): No  ?Physical Activity: Insufficiently Active  ? Days of Exercise per Week: 3 days  ? Minutes of Exercise per Session: 40 min  ?Stress: No Stress Concern Present  ? Feeling of Stress : Not at all  ?Social Connections: Moderately Integrated  ? Frequency of Communication with Friends and Family: More than three times a week  ?  Frequency of Social Gatherings with Friends and Family: More than three times a week  ? Attends Religious Services: More than 4 times per year  ? Active Member of Clubs or Organizations: Yes  ? Attends Archivist Meetings: More than 4 times per year  ? Marital Status: Never married  ?Intimate Partner Violence: Not on file  ? ? ?Past Surgical History:  ?Procedure Laterality Date  ? no surgeries at this time    ? ? ?Family History  ?Problem Relation Age of Onset  ? Hypertension Mother   ?     fhx  ? COPD Father   ? Lung cancer Father   ? Pancreatic cancer Brother   ? Colon cancer Sister   ? ? ?No Known Allergies ? ?Current Outpatient Medications on File Prior to Visit  ?Medication Sig Dispense Refill  ? albuterol (VENTOLIN HFA) 108 (90 Base) MCG/ACT inhaler  INHALE 2 PUFFS INTO THE LUNGS EVERY 6 (SIX) HOURS AS NEEDED FOR WHEEZING. 18 each 2  ? amLODipine (NORVASC) 2.5 MG tablet Take 1 tablet (2.5 mg total) by mouth daily. 90 tablet 1  ? BLACK COHOSH PO Take by mouth.    ? glipiZIDE (GLUCOTROL XL) 10 MG 24 hr tablet TAKE 1 TABLET (10 MG TOTAL) BY MOUTH IN THE MORNING AND AT BEDTIME. 180 tablet 0  ? glucose blood (ACCU-CHEK GUIDE) test strip USE TO TEST BLOOD GLUCOSE TWICE DAILY 200 each 3  ? Lancets (ACCU-CHEK MULTICLIX) lancets USE TO TEST BLOOD GLUCOSE TWICE DAILY 200 each 3  ? levothyroxine (SYNTHROID) 50 MCG tablet Take 1 tablet (50 mcg total) by mouth daily. 90 tablet 1  ? lisinopril (ZESTRIL) 40 MG tablet Take 1 tablet (40 mg total) by mouth daily. 90 tablet 3  ? Multiple Vitamin (MULTIVITAMIN WITH MINERALS) TABS Take 1 tablet by mouth daily.    ? omeprazole (PRILOSEC) 20 MG capsule Take 1 capsule (20 mg total) by mouth daily. 90 capsule 1  ? tirzepatide (MOUNJARO) 10 MG/0.5ML Pen Inject 10 mg into the skin once a week. 6 mL 0  ? ?No current facility-administered medications on file prior to visit.  ? ? ?BP 122/88   Pulse 96   Temp 98.7 ?F (37.1 ?C) (Oral)   Ht '5\' 6"'$  (1.676 m)   Wt 176 lb (79.8 kg)   LMP 05/17/2016   SpO2 99%   BMI 28.41 kg/m?  ? ? ?   ?Objective:  ? Physical Exam ?Vitals and nursing note reviewed.  ?Constitutional:   ?   Appearance: Normal appearance.  ?Cardiovascular:  ?   Rate and Rhythm: Normal rate and regular rhythm.  ?   Pulses: Normal pulses.  ?   Heart sounds: Normal heart sounds.  ?Pulmonary:  ?   Effort: Pulmonary effort is normal.  ?   Breath sounds: Normal breath sounds.  ?Musculoskeletal:     ?   General: Normal range of motion.  ?Skin: ?   General: Skin is warm and dry.  ?Neurological:  ?   General: No focal deficit present.  ?   Mental Status: She is alert and oriented to person, place, and time.  ?Psychiatric:     ?   Mood and Affect: Mood normal.     ?   Behavior: Behavior normal.     ?   Thought Content: Thought content  normal.     ?   Judgment: Judgment normal.  ? ?   ?Assessment & Plan:  ?1. Diabetes 1.5, managed as type  2 ([place]) ? ?- POC HgB A1c- 6.6 - finally under the 7 mark.  ?- Continue with glipizide and Mounjaro 10 mg  ?- Follow up in three months. Hopefully she can start to come off glipizide  ? ?2. Essential hypertension ?- Well controlled.  ?- No change in medications  ? ?Dorothyann Peng, NP ? ? ? ?

## 2021-08-04 ENCOUNTER — Other Ambulatory Visit (HOSPITAL_COMMUNITY): Payer: Self-pay

## 2021-08-31 ENCOUNTER — Other Ambulatory Visit (HOSPITAL_COMMUNITY): Payer: Self-pay

## 2021-09-08 ENCOUNTER — Other Ambulatory Visit (HOSPITAL_COMMUNITY): Payer: Self-pay

## 2021-09-12 ENCOUNTER — Other Ambulatory Visit (HOSPITAL_COMMUNITY): Payer: Self-pay

## 2021-10-05 ENCOUNTER — Other Ambulatory Visit: Payer: Self-pay | Admitting: Adult Health

## 2021-10-06 ENCOUNTER — Other Ambulatory Visit (HOSPITAL_COMMUNITY): Payer: Self-pay

## 2021-10-06 ENCOUNTER — Other Ambulatory Visit: Payer: Self-pay | Admitting: Adult Health

## 2021-10-06 DIAGNOSIS — E139 Other specified diabetes mellitus without complications: Secondary | ICD-10-CM

## 2021-10-06 MED ORDER — MOUNJARO 10 MG/0.5ML ~~LOC~~ SOAJ
10.0000 mg | SUBCUTANEOUS | 0 refills | Status: DC
  Filled 2021-10-06: qty 2, 28d supply, fill #0
  Filled 2021-11-02: qty 2, 28d supply, fill #1

## 2021-10-26 ENCOUNTER — Ambulatory Visit: Payer: Managed Care, Other (non HMO) | Admitting: Adult Health

## 2021-10-26 LAB — HM DIABETES EYE EXAM

## 2021-10-27 ENCOUNTER — Ambulatory Visit (INDEPENDENT_AMBULATORY_CARE_PROVIDER_SITE_OTHER): Payer: Managed Care, Other (non HMO) | Admitting: Adult Health

## 2021-10-27 ENCOUNTER — Encounter: Payer: Self-pay | Admitting: Adult Health

## 2021-10-27 ENCOUNTER — Other Ambulatory Visit (HOSPITAL_COMMUNITY): Payer: Self-pay

## 2021-10-27 VITALS — BP 134/82 | HR 92 | Temp 99.1°F | Ht 66.0 in | Wt 177.2 lb

## 2021-10-27 DIAGNOSIS — E139 Other specified diabetes mellitus without complications: Secondary | ICD-10-CM | POA: Diagnosis not present

## 2021-10-27 DIAGNOSIS — I10 Essential (primary) hypertension: Secondary | ICD-10-CM | POA: Diagnosis not present

## 2021-10-27 LAB — POCT GLYCOSYLATED HEMOGLOBIN (HGB A1C): Hemoglobin A1C: 6.8 % — AB (ref 4.0–5.6)

## 2021-10-27 MED ORDER — TIRZEPATIDE 12.5 MG/0.5ML ~~LOC~~ SOAJ
12.5000 mg | SUBCUTANEOUS | 0 refills | Status: DC
  Filled 2021-10-27 – 2021-11-07 (×4): qty 2, 28d supply, fill #0
  Filled 2021-12-06: qty 2, 28d supply, fill #1
  Filled 2022-01-05: qty 2, 28d supply, fill #2

## 2021-10-27 NOTE — Progress Notes (Signed)
Subjective:    Patient ID: Autumn Acevedo, female    DOB: 1961-01-17, 61 y.o.   MRN: [id number]  HPI 61 year old female who  has a past medical history of Allergy, Diabetes mellitus without complication ([place]), Goiter, Hypertension, Hypothyroid, and ITP (idiopathic thrombocytopenic purpura).  She presents to the office today for follow-up regarding type 2 diabetes mellitus and hypertension.  Diabetes mellitus-currently maintained on glipizide 10 mg ER twice daily and Mounjaro 10 mg weekly.  She does monitor her blood sugars at home with readings in the 80s to 120s.  She is working out and walking more often and not eating late at night.  Since starting [facility] her portion sizes have decreased. Lab Results  Component Value Date   HGBA1C 6.6 (A) 07/22/2021   Wt Readings from Last 3 Encounters:  10/27/21 177 lb 3.2 oz (80.4 kg)  07/22/21 176 lb (79.8 kg)  05/24/21 180 lb (81.6 kg)   HTN -this is well controlled with lisinopril 40 mg daily and Norvasc 2.5 mg daily.  She denies dizziness, lightheadedness, chest pain, shortness of breath BP Readings from Last 3 Encounters:  10/27/21 134/82  07/22/21 122/88  05/24/21 128/78    Review of Systems See HPI   Past Medical History:  Diagnosis Date   Allergy    Diabetes mellitus without complication ([place])    Type2    Goiter    Hypertension    Hypothyroid    ITP (idiopathic thrombocytopenic purpura)     Social History   Socioeconomic History   Marital status: Single    Spouse name: Not on file   Number of children: Not on file   Years of education: Not on file   Highest education level: Associate degree: occupational, Hotel manager, or vocational program  Occupational History   Not on file  Tobacco Use   Smoking status: Never   Smokeless tobacco: Never   Tobacco comments:    never used tobacco  Vaping Use   Vaping Use: Never used  Substance and Sexual Activity   Alcohol use: Yes    Alcohol/week: 1.0 standard drink of  alcohol    Types: 1 Standard drinks or equivalent per week    Comment: 2-3 drinks a week    Drug use: No   Sexual activity: Not on file  Other Topics Concern   Not on file  Social History Narrative   Works for an IT consultant    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 Has two grand babies   She enjoys reading and going to the gym. Hanging out with friends.       Social Determinants of Health   Financial Resource Strain: Low Risk  (07/18/2021)   Overall Financial Resource Strain (CARDIA)    Difficulty of Paying Living Expenses: Not hard at all  Food Insecurity: No Food Insecurity (07/18/2021)   Hunger Vital Sign    Worried About Running Out of Food in the Last Year: Never true    Ran Out of Food in the Last Year: Never true  Transportation Needs: No Transportation Needs (07/18/2021)   PRAPARE - Hydrologist (Medical): No    Lack of Transportation (Non-Medical): No  Physical Activity: Insufficiently Active (07/18/2021)   Exercise Vital Sign    Days of Exercise per Week: 3 days    Minutes of Exercise per Session: 40 min  Stress: No Stress Concern Present (07/18/2021)  [employer] - Occupational Stress Questionnaire    Feeling of Stress : Not at all  Social Connections: Moderately Integrated (07/18/2021)   Social Connection and Isolation Panel [NHANES]    Frequency of Communication with Friends and Family: More than three times a week    Frequency of Social Gatherings with Friends and Family: More than three times a week    Attends Religious Services: More than 4 times per year    Active Member of [employer] or Organizations: Yes    Attends Music therapist: More than 4 times per year    Marital Status: Never married  Human resources officer Violence: Not on file    Past Surgical History:  Procedure Laterality Date   no surgeries at this time      Family History  Problem Relation Age of  Onset   Hypertension Mother        fhx   COPD Father    Lung cancer Father    Pancreatic cancer Brother    Colon cancer Sister     No Known Allergies  Current Outpatient Medications on File Prior to Visit  Medication Sig Dispense Refill   albuterol (VENTOLIN HFA) 108 (90 Base) MCG/ACT inhaler INHALE 2 PUFFS INTO THE LUNGS EVERY 6 (SIX) HOURS AS NEEDED FOR WHEEZING. 18 each 2   amLODipine (NORVASC) 2.5 MG tablet Take 1 tablet (2.5 mg total) by mouth daily. 90 tablet 1   BLACK COHOSH PO Take by mouth.     glipiZIDE (GLUCOTROL XL) 10 MG 24 hr tablet TAKE 1 TABLET (10 MG TOTAL) BY MOUTH IN THE MORNING AND AT BEDTIME 180 tablet 0   glucose blood (ACCU-CHEK GUIDE) test strip USE TO TEST BLOOD GLUCOSE TWICE DAILY 200 each 3   Lancets (ACCU-CHEK MULTICLIX) lancets USE TO TEST BLOOD GLUCOSE TWICE DAILY 200 each 3   levothyroxine (SYNTHROID) 50 MCG tablet Take 1 tablet (50 mcg total) by mouth daily. 90 tablet 1   lisinopril (ZESTRIL) 40 MG tablet Take 1 tablet (40 mg total) by mouth daily. 90 tablet 3   Multiple Vitamin (MULTIVITAMIN WITH MINERALS) TABS Take 1 tablet by mouth daily.     omeprazole (PRILOSEC) 20 MG capsule Take 1 capsule (20 mg total) by mouth daily. 90 capsule 1   tirzepatide (MOUNJARO) 10 MG/0.5ML Pen Inject 10 mg into the skin once a week. 6 mL 0   No current facility-administered medications on file prior to visit.    BP 134/82 (BP Location: Left Arm, Patient Position: Sitting, Cuff Size: Normal)   Pulse 92   Temp 99.1 F (37.3 C) (Oral)   Ht '5\' 6"'$  (1.676 m)   Wt 177 lb 3.2 oz (80.4 kg)   LMP 05/17/2016   SpO2 99%   BMI 28.60 kg/m       Objective:   Physical Exam Vitals and nursing note reviewed.  Constitutional:      Appearance: Normal appearance.  Cardiovascular:     Rate and Rhythm: Normal rate and regular rhythm.     Pulses: Normal pulses.     Heart sounds: Normal heart sounds.  Pulmonary:     Effort: Pulmonary effort is normal.     Breath sounds:  Normal breath sounds.  Abdominal:     General: Abdomen is flat.     Palpations: Abdomen is soft.  Musculoskeletal:        General: Normal range of motion.  Skin:    General: Skin is warm and dry.  Neurological:  General: No focal deficit present.     Mental Status: She is alert and oriented to person, place, and time.  Psychiatric:        Mood and Affect: Mood normal.        Behavior: Behavior normal.        Thought Content: Thought content normal.        Judgment: Judgment normal.        Assessment & Plan:  1. Essential hypertension - well controlled - No change in medications    2. Diabetes 1.5, managed as type 2 ([place])  - POCT glycosylated hemoglobin (Hb A1C)- 6.8  - Will increase mounjaro to 12.5 mg weekly.  - Follow up in 3 months for CPE  - tirzepatide (MOUNJARO) 12.5 MG/0.5ML Pen; Inject 12.5 mg into the skin once a week.  Dispense: 6 mL; Refill: 0  Dorothyann Peng, NP

## 2021-10-31 ENCOUNTER — Encounter: Payer: Self-pay | Admitting: Adult Health

## 2021-11-01 ENCOUNTER — Other Ambulatory Visit: Payer: Self-pay | Admitting: Adult Health

## 2021-11-01 DIAGNOSIS — I10 Essential (primary) hypertension: Secondary | ICD-10-CM

## 2021-11-02 ENCOUNTER — Other Ambulatory Visit (HOSPITAL_COMMUNITY): Payer: Self-pay

## 2021-11-02 ENCOUNTER — Encounter: Payer: Self-pay | Admitting: Adult Health

## 2021-11-02 ENCOUNTER — Telehealth: Payer: Self-pay

## 2021-11-02 NOTE — Telephone Encounter (Signed)
Spoke to pt and advised that a PA was started. Pt stated she took her last dose today and will pick the other one next week. Pt advised to hold off to see what insurance say. Pt does know to pick it up before she is due for the next injection if insurance have not made a decision.

## 2021-11-02 NOTE — Telephone Encounter (Signed)
Autumn Acevedo [facility]ussmaul - Rx #: [id number] Need help? Call us at [phone] Outcome Additional Information Required Your PA has been resolved, no additional PA is required. For further inquiries please contact the number on the back of the member prescription card. (Message [phone]) Drug Mounjaro 12.'5MG'$ /0.5ML pen-injectors Form Charity fundraiser PA Form [phone] NCPDP)

## 2021-11-07 ENCOUNTER — Other Ambulatory Visit (HOSPITAL_COMMUNITY): Payer: Self-pay

## 2021-11-14 ENCOUNTER — Encounter: Payer: Self-pay | Admitting: Adult Health

## 2021-11-14 DIAGNOSIS — J40 Bronchitis, not specified as acute or chronic: Secondary | ICD-10-CM

## 2021-11-14 DIAGNOSIS — Z760 Encounter for issue of repeat prescription: Secondary | ICD-10-CM

## 2021-11-14 MED ORDER — ALBUTEROL SULFATE HFA 108 (90 BASE) MCG/ACT IN AERS: 2.0000 | INHALATION_SPRAY | Freq: Four times a day (QID) | RESPIRATORY_TRACT | 2 refills | Status: AC | PRN

## 2021-11-23 ENCOUNTER — Other Ambulatory Visit: Payer: Self-pay | Admitting: Adult Health

## 2021-12-06 ENCOUNTER — Other Ambulatory Visit (HOSPITAL_COMMUNITY): Payer: Self-pay

## 2022-01-06 ENCOUNTER — Other Ambulatory Visit (HOSPITAL_COMMUNITY): Payer: Self-pay

## 2022-01-10 ENCOUNTER — Encounter: Payer: Self-pay | Admitting: Adult Health

## 2022-01-10 ENCOUNTER — Ambulatory Visit (INDEPENDENT_AMBULATORY_CARE_PROVIDER_SITE_OTHER): Payer: Managed Care, Other (non HMO) | Admitting: Adult Health

## 2022-01-10 VITALS — BP 110/70 | HR 60 | Temp 98.0°F | Ht 65.0 in | Wt 172.0 lb

## 2022-01-10 DIAGNOSIS — I10 Essential (primary) hypertension: Secondary | ICD-10-CM

## 2022-01-10 DIAGNOSIS — Z0000 Encounter for general adult medical examination without abnormal findings: Secondary | ICD-10-CM

## 2022-01-10 DIAGNOSIS — E139 Other specified diabetes mellitus without complications: Secondary | ICD-10-CM | POA: Diagnosis not present

## 2022-01-10 DIAGNOSIS — E039 Hypothyroidism, unspecified: Secondary | ICD-10-CM

## 2022-01-10 DIAGNOSIS — E049 Nontoxic goiter, unspecified: Secondary | ICD-10-CM

## 2022-01-10 LAB — COMPREHENSIVE METABOLIC PANEL
ALT: 25 U/L (ref 0–35)
AST: 20 U/L (ref 0–37)
Albumin: 4.2 g/dL (ref 3.5–5.2)
Alkaline Phosphatase: 88 U/L (ref 39–117)
BUN: 10 mg/dL (ref 6–23)
CO2: 27 mEq/L (ref 19–32)
Calcium: 9.4 mg/dL (ref 8.4–10.5)
Chloride: 102 mEq/L (ref 96–112)
Creatinine, Ser: 0.92 mg/dL (ref 0.40–1.20)
GFR: 67.55 mL/min (ref >60.00)
Glucose, Bld: 135 mg/dL — ABNORMAL HIGH (ref 70–99)
Potassium: 4.2 mEq/L (ref 3.5–5.1)
Sodium: 138 mEq/L (ref 135–145)
Total Bilirubin: 0.8 mg/dL (ref 0.2–1.2)
Total Protein: 7.5 g/dL (ref 6.0–8.3)

## 2022-01-10 LAB — LIPID PANEL
Cholesterol: 144 mg/dL (ref 0–200)
HDL: 31.4 mg/dL — ABNORMAL LOW (ref >39.00)
LDL Cholesterol: 85 mg/dL (ref 0–99)
NonHDL: 112.88
Total CHOL/HDL Ratio: 5
Triglycerides: 139 mg/dL (ref 0.0–149.0)
VLDL: 27.8 mg/dL (ref 0.0–40.0)

## 2022-01-10 LAB — CBC WITH DIFFERENTIAL/PLATELET
Basophils Absolute: 0 10*3/uL (ref 0.0–0.1)
Basophils Relative: 0.9 % (ref 0.0–3.0)
Eosinophils Absolute: 0.1 10*3/uL (ref 0.0–0.7)
Eosinophils Relative: 2.3 % (ref 0.0–5.0)
HCT: 41 % (ref 36.0–46.0)
Hemoglobin: 13.2 g/dL (ref 12.0–15.0)
Lymphocytes Relative: 54.6 % — ABNORMAL HIGH (ref 12.0–46.0)
Lymphs Abs: 2.9 10*3/uL (ref 0.7–4.0)
MCHC: 32.3 g/dL (ref 30.0–36.0)
MCV: 71.7 fl — ABNORMAL LOW (ref 78.0–100.0)
Monocytes Absolute: 0.4 10*3/uL (ref 0.1–1.0)
Monocytes Relative: 6.9 % (ref 3.0–12.0)
Neutro Abs: 1.9 10*3/uL (ref 1.4–7.7)
Neutrophils Relative %: 35.3 % — ABNORMAL LOW (ref 43.0–77.0)
Platelets: 234 10*3/uL (ref 150.0–400.0)
RBC: 5.72 Mil/uL — ABNORMAL HIGH (ref 3.87–5.11)
RDW: 14.5 % (ref 11.5–15.5)
WBC: 5.3 10*3/uL (ref 4.0–10.5)

## 2022-01-10 LAB — TSH: TSH: 0.16 u[IU]/mL — ABNORMAL LOW (ref 0.35–5.50)

## 2022-01-10 LAB — HEMOGLOBIN A1C: Hgb A1c MFr Bld: 7.3 % — ABNORMAL HIGH (ref 4.6–6.5)

## 2022-01-10 NOTE — Progress Notes (Signed)
Subjective:    Patient ID: Autumn Acevedo, female    DOB: 01-May-1960, 61 y.o.   MRN: [id number]  HPI Patient presents for yearly preventative medicine examination. She is a pleasant 61 year old female who  has a past medical history of Allergy, Diabetes mellitus without complication ([place]), Goiter, Hypertension, Hypothyroid, and ITP (idiopathic thrombocytopenic purpura).  DM Type 2 -currently managed with glipizide 10 mg twice daily, and Mounjaro 12.5 mg weekly.  He does monitor her blood sugars at home with readings in the 80s to 120s. She has had some hypoglycemic episodes.  She is working out more and trying to eat healthier, not eating late at night.  Since starting [facility] her portion sizes have decreased Lab Results  Component Value Date   HGBA1C 6.8 (A) 10/27/2021   Hypertension-managed with Norvasc 2.5 mg and lisinopril 40 mg daily.  She denies dizziness, lightheadedness, chest pain, or shortness of breath BP Readings from Last 3 Encounters:  01/10/22 110/70  10/27/21 134/82  07/22/21 122/88   Hypothyroidism-managed with Synthroid 50 mcg daily. She does have a goiter and feels like it is becoming larger. She sometimes has trouble swallowing.    All immunizations and health maintenance protocols were reviewed with the patient and needed orders were placed. She will get flu shot at work. Refuses Tdap.   Appropriate screening laboratory values were ordered for the patient including screening of hyperlipidemia, renal function and hepatic function.  Medication reconciliation,  past medical history, social history, problem list and allergies were reviewed in detail with the patient  Goals were established with regard to weight loss, exercise, and  diet in compliance with medications Wt Readings from Last 10 Encounters:  01/10/22 172 lb (78 kg)  10/27/21 177 lb 3.2 oz (80.4 kg)  07/22/21 176 lb (79.8 kg)  05/24/21 180 lb (81.6 kg)  04/25/21 186 lb (84.4 kg)  04/20/21 186 lb  12.8 oz (84.7 kg)  03/16/21 184 lb (83.5 kg)  02/09/21 187 lb (84.8 kg)  01/07/21 185 lb (83.9 kg)  10/20/20 181 lb (82.1 kg)   She is up-to-date on routine colon cancer screening  Review of Systems  Constitutional: Negative.   HENT: Negative.    Eyes: Negative.   Respiratory: Negative.    Cardiovascular: Negative.   Gastrointestinal: Negative.   Endocrine: Negative.   Genitourinary: Negative.   Musculoskeletal: Negative.   Skin: Negative.   Allergic/Immunologic: Negative.   Neurological: Negative.   Hematological: Negative.   Psychiatric/Behavioral: Negative.     Past Medical History:  Diagnosis Date   Allergy    Diabetes mellitus without complication (HCC)    Type2    Goiter    Hypertension    Hypothyroid    ITP (idiopathic thrombocytopenic purpura)     Social History   Socioeconomic History   Marital status: Single    Spouse name: Not on file   Number of children: Not on file   Years of education: Not on file   Highest education level: Associate degree: occupational, Hotel manager, or vocational program  Occupational History   Not on file  Tobacco Use   Smoking status: Never   Smokeless tobacco: Never   Tobacco comments:    never used tobacco  Vaping Use   Vaping Use: Never used  Substance and Sexual Activity   Alcohol use: Yes    Alcohol/week: 1.0 standard drink of alcohol    Types: 1 Standard drinks or equivalent per week    Comment: 2-3 drinks a week  Drug use: No   Sexual activity: Not on file  Other Topics Concern   Not on file  Social History Narrative   Works for an IT consultant    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 Has two grand babies   She enjoys reading and going to the gym. Hanging out with friends.       Social Determinants of Health   Financial Resource Strain: Low Risk  (07/18/2021)   Overall Financial Resource Strain (CARDIA)    Difficulty of Paying Living Expenses: Not hard at all  Food  Insecurity: No Food Insecurity (07/18/2021)   Hunger Vital Sign    Worried About Running Out of Food in the Last Year: Never true    Ran Out of Food in the Last Year: Never true  Transportation Needs: No Transportation Needs (07/18/2021)   PRAPARE - Hydrologist (Medical): No    Lack of Transportation (Non-Medical): No  Physical Activity: Insufficiently Active (07/18/2021)   Exercise Vital Sign    Days of Exercise per Week: 3 days    Minutes of Exercise per Session: 40 min  Stress: No Stress Concern Present (07/18/2021)   [place]    Feeling of Stress : Not at all  Social Connections: Moderately Integrated (07/18/2021)   Social Connection and Isolation Panel [NHANES]    Frequency of Communication with Friends and Family: More than three times a week    Frequency of Social Gatherings with Friends and Family: More than three times a week    Attends Religious Services: More than 4 times per year    Active Member of [employer] or Organizations: Yes    Attends Music therapist: More than 4 times per year    Marital Status: Never married  Human resources officer Violence: Not on file    Past Surgical History:  Procedure Laterality Date   no surgeries at this time      Family History  Problem Relation Age of Onset   Hypertension Mother        fhx   COPD Father    Lung cancer Father    Pancreatic cancer Brother    Colon cancer Sister     No Known Allergies  Current Outpatient Medications on File Prior to Visit  Medication Sig Dispense Refill   albuterol (VENTOLIN HFA) 108 (90 Base) MCG/ACT inhaler Inhale 2 puffs into the lungs every 6 (six) hours as needed for wheezing. 18 each 2   amLODipine (NORVASC) 2.5 MG tablet TAKE 1 TABLET BY MOUTH EVERY DAY 90 tablet 1   BLACK COHOSH PO Take by mouth.     glipiZIDE (GLUCOTROL XL) 10 MG 24 hr tablet TAKE 1 TABLET (10 MG TOTAL) BY MOUTH IN THE  MORNING AND AT BEDTIME 180 tablet 0   glucose blood (ACCU-CHEK GUIDE) test strip USE TO TEST BLOOD GLUCOSE TWICE DAILY 200 each 3   Lancets (ACCU-CHEK MULTICLIX) lancets USE TO TEST BLOOD GLUCOSE TWICE DAILY 200 each 3   levothyroxine (SYNTHROID) 50 MCG tablet TAKE 1 TABLET BY MOUTH EVERY DAY 90 tablet 1   lisinopril (ZESTRIL) 40 MG tablet Take 1 tablet (40 mg total) by mouth daily. 90 tablet 3   Multiple Vitamin (MULTIVITAMIN WITH MINERALS) TABS Take 1 tablet by mouth daily.     omeprazole (PRILOSEC) 20 MG capsule Take 1 capsule (20 mg total) by mouth daily. [place]  capsule 1   tirzepatide (MOUNJARO) 12.5 MG/0.5ML Pen Inject 12.5 mg into the skin once a week. 6 mL 0   No current facility-administered medications on file prior to visit.    BP 110/70   Pulse 60   Temp 98 F (36.7 C)   Wt 172 lb (78 kg)   LMP 05/17/2016   SpO2 97%   BMI 27.76 kg/m       Objective:   Physical Exam Vitals and nursing note reviewed.  Constitutional:      General: She is not in acute distress.    Appearance: Normal appearance. She is well-developed. She is not ill-appearing.  HENT:     Head: Normocephalic and atraumatic.     Right Ear: Tympanic membrane, ear canal and external ear normal. There is no impacted cerumen.     Left Ear: Tympanic membrane, ear canal and external ear normal. There is no impacted cerumen.     Nose: Nose normal. No congestion or rhinorrhea.     Mouth/Throat:     Mouth: Mucous membranes are moist.     Pharynx: Oropharynx is clear. No oropharyngeal exudate or posterior oropharyngeal erythema.  Eyes:     General:        Right eye: No discharge.        Left eye: No discharge.     Extraocular Movements: Extraocular movements intact.     Conjunctiva/sclera: Conjunctivae normal.     Pupils: Pupils are equal, round, and reactive to light.  Neck:     Thyroid: Thyroid mass present. No thyromegaly.     Vascular: No carotid bruit.     Trachea: No tracheal deviation.  Cardiovascular:      Rate and Rhythm: Normal rate and regular rhythm.     Pulses: Normal pulses.     Heart sounds: Normal heart sounds. No murmur heard.    No friction rub. No gallop.  Pulmonary:     Effort: Pulmonary effort is normal. No respiratory distress.     Breath sounds: Normal breath sounds. No stridor. No wheezing, rhonchi or rales.  Chest:     Chest wall: No tenderness.  Abdominal:     General: Abdomen is flat. Bowel sounds are normal. There is no distension.     Palpations: Abdomen is soft. There is no mass.     Tenderness: There is no abdominal tenderness. There is no right CVA tenderness, left CVA tenderness, guarding or rebound.     Hernia: No hernia is present.  Musculoskeletal:        General: No swelling, tenderness, deformity or signs of injury. Normal range of motion.     Cervical back: Normal range of motion and neck supple.     Right lower leg: No edema.     Left lower leg: No edema.  Lymphadenopathy:     Cervical: No cervical adenopathy.  Skin:    General: Skin is warm and dry.     Coloration: Skin is not jaundiced or pale.     Findings: No bruising, erythema, lesion or rash.  Neurological:     General: No focal deficit present.     Mental Status: She is alert and oriented to person, place, and time.     Cranial Nerves: No cranial nerve deficit.     Sensory: No sensory deficit.     Motor: No weakness.     Coordination: Coordination normal.     Gait: Gait normal.     Deep Tendon Reflexes: Reflexes normal.  Psychiatric:        Mood and Affect: Mood normal.        Behavior: Behavior normal.        Thought Content: Thought content normal.        Judgment: Judgment normal.       Assessment & Plan:  1. Routine general medical examination at a health care facility - Continue with weight loss management  - Follow up in one year or sooner if needed - CBC with Differential/Platelet; Future - Comprehensive metabolic panel; Future - Hemoglobin A1c; Future - Lipid panel;  Future - TSH; Future - Microalbumin/Creatinine Ratio, Urine; Future  2. Essential hypertension - stop norvasc 2.5 mg  - CBC with Differential/Platelet; Future - Comprehensive metabolic panel; Future - Hemoglobin A1c; Future - Lipid panel; Future - TSH; Future - Microalbumin/Creatinine Ratio, Urine; Future  3. Diabetes 1.5, managed as type 2 ([place]) - Consider decreasing glipizide dose  - CBC with Differential/Platelet; Future - Comprehensive metabolic panel; Future - Hemoglobin A1c; Future - Lipid panel; Future - TSH; Future - Microalbumin/Creatinine Ratio, Urine; Future  4. Acquired hypothyroidism - Consider dose change of synthroid  - CBC with Differential/Platelet; Future - Comprehensive metabolic panel; Future - Hemoglobin A1c; Future - Lipid panel; Future - TSH; Future - Microalbumin/Creatinine Ratio, Urine; Future  5. Thyroid goiter  - Ambulatory referral to General Surgery  Dorothyann Peng, NP

## 2022-01-10 NOTE — Patient Instructions (Addendum)
It was great seeing you today   We will follow up with you regarding your lab work   Please let me know if you need anything   Follow up in 3 months

## 2022-01-10 NOTE — Addendum Note (Signed)
Addended by: Rosalyn Gess D on: 01/10/2022 08:05 AM   Modules accepted: Orders

## 2022-01-11 ENCOUNTER — Other Ambulatory Visit: Payer: Self-pay

## 2022-01-11 MED ORDER — BLOOD GLUCOSE MONITOR KIT: PACK | 0 refills | Status: AC

## 2022-01-11 MED ORDER — LEVOTHYROXINE SODIUM 25 MCG PO TABS: 25.0000 ug | ORAL_TABLET | Freq: Every day | ORAL | 3 refills | Status: DC

## 2022-01-13 LAB — MICROALBUMIN / CREATININE URINE RATIO
Creatinine,U: 237.6 mg/dL
Microalb Creat Ratio: 1.1 mg/g (ref 0.0–30.0)
Microalb, Ur: 2.7 mg/dL — ABNORMAL HIGH (ref 0.0–1.9)

## 2022-01-25 ENCOUNTER — Encounter: Payer: Self-pay | Admitting: Adult Health

## 2022-01-26 ENCOUNTER — Other Ambulatory Visit: Payer: Self-pay | Admitting: Adult Health

## 2022-01-26 DIAGNOSIS — I10 Essential (primary) hypertension: Secondary | ICD-10-CM

## 2022-01-26 DIAGNOSIS — E139 Other specified diabetes mellitus without complications: Secondary | ICD-10-CM

## 2022-01-27 ENCOUNTER — Other Ambulatory Visit: Payer: Self-pay | Admitting: Adult Health

## 2022-01-27 DIAGNOSIS — Z1231 Encounter for screening mammogram for malignant neoplasm of breast: Secondary | ICD-10-CM

## 2022-02-01 ENCOUNTER — Other Ambulatory Visit: Payer: Self-pay | Admitting: Adult Health

## 2022-02-01 DIAGNOSIS — E139 Other specified diabetes mellitus without complications: Secondary | ICD-10-CM

## 2022-02-02 ENCOUNTER — Other Ambulatory Visit (HOSPITAL_COMMUNITY): Payer: Self-pay

## 2022-02-02 MED ORDER — MOUNJARO 12.5 MG/0.5ML ~~LOC~~ SOAJ
12.5000 mg | SUBCUTANEOUS | 0 refills | Status: DC
  Filled 2022-02-02: qty 2, 28d supply, fill #0
  Filled 2022-03-03: qty 2, 28d supply, fill #1
  Filled 2022-04-02: qty 2, 28d supply, fill #2

## 2022-02-04 ENCOUNTER — Other Ambulatory Visit: Payer: Self-pay | Admitting: Adult Health

## 2022-02-04 DIAGNOSIS — E139 Other specified diabetes mellitus without complications: Secondary | ICD-10-CM

## 2022-02-13 ENCOUNTER — Encounter: Payer: Self-pay | Admitting: Adult Health

## 2022-03-03 ENCOUNTER — Other Ambulatory Visit (HOSPITAL_COMMUNITY): Payer: Self-pay

## 2022-03-10 ENCOUNTER — Ambulatory Visit
Admission: RE | Admit: 2022-03-10 | Discharge: 2022-03-10 | Disposition: A | Discharge status: Home / Self Care | Payer: Managed Care, Other (non HMO) | Source: Ambulatory Visit | Attending: Adult Health | Admitting: Adult Health

## 2022-03-10 DIAGNOSIS — Z1231 Encounter for screening mammogram for malignant neoplasm of breast: Secondary | ICD-10-CM

## 2022-04-03 ENCOUNTER — Other Ambulatory Visit (HOSPITAL_COMMUNITY): Payer: Self-pay

## 2022-04-11 ENCOUNTER — Encounter: Payer: Self-pay | Admitting: Adult Health

## 2022-04-11 ENCOUNTER — Other Ambulatory Visit: Payer: Self-pay | Admitting: Adult Health

## 2022-04-11 ENCOUNTER — Other Ambulatory Visit (HOSPITAL_COMMUNITY): Payer: Self-pay

## 2022-04-11 ENCOUNTER — Ambulatory Visit (INDEPENDENT_AMBULATORY_CARE_PROVIDER_SITE_OTHER): Payer: Managed Care, Other (non HMO) | Admitting: Adult Health

## 2022-04-11 VITALS — BP 130/70 | Temp 98.1°F | Ht 65.0 in | Wt 170.0 lb

## 2022-04-11 DIAGNOSIS — E039 Hypothyroidism, unspecified: Secondary | ICD-10-CM

## 2022-04-11 DIAGNOSIS — I10 Essential (primary) hypertension: Secondary | ICD-10-CM

## 2022-04-11 DIAGNOSIS — E1169 Type 2 diabetes mellitus with other specified complication: Secondary | ICD-10-CM | POA: Diagnosis not present

## 2022-04-11 LAB — T4, FREE: Free T4: 0.93 ng/dL (ref 0.60–1.60)

## 2022-04-11 LAB — POCT GLYCOSYLATED HEMOGLOBIN (HGB A1C): Hemoglobin A1C: 6.1 % — AB (ref 4.0–5.6)

## 2022-04-11 LAB — T3, FREE: T3, Free: 3.7 pg/mL (ref 2.3–4.2)

## 2022-04-11 LAB — TSH: TSH: 0.19 u[IU]/mL — ABNORMAL LOW (ref 0.35–5.50)

## 2022-04-11 MED ORDER — MOUNJARO 12.5 MG/0.5ML ~~LOC~~ SOAJ
12.5000 mg | SUBCUTANEOUS | 0 refills | Status: DC
  Filled 2022-04-11: qty 2, 28d supply, fill #0
  Filled 2022-05-28 – 2022-06-05 (×3): qty 2, 28d supply, fill #1

## 2022-04-11 MED ORDER — GLIPIZIDE ER 10 MG PO TB24
10.0000 mg | ORAL_TABLET | Freq: Two times a day (BID) | ORAL | 0 refills | Status: DC
  Filled 2022-04-11: qty 60, 30d supply, fill #0
  Filled 2022-08-14 – 2022-09-01 (×2): qty 60, 30d supply, fill #1
  Filled 2022-10-31: qty 60, 30d supply, fill #2

## 2022-04-11 NOTE — Progress Notes (Signed)
Subjective:    Patient ID: Autumn Acevedo, female    DOB: 03-Nov-1960, 61 y.o.   MRN: [id number]  HPI 61 year old female who  has a past medical history of Allergy, Diabetes mellitus without complication ([place]), Goiter, Hypertension, Hypothyroid, and ITP (idiopathic thrombocytopenic purpura).  She presents to the office today for 49-monthfollow-up regarding diabetes and hypertension  Diabetes mellitus type II-currently managed with glipizide 10 mg twice daily and Mounjaro 12.5 mg weekly.  She does monitor her blood sugars at home with readings in the 80s to 120s.  She is working out more and trying to eat healthier.  [facility]  Lab Results  Component Value Date   HGBA1C 7.3 (H) 01/10/2022   Wt Readings from Last 3 Encounters:  04/11/22 170 lb (77.1 kg)  01/10/22 172 lb (78 kg)  10/27/21 177 lb 3.2 oz (80.4 kg)   Hypertension -managed with Norvasc 2.5 mg daily and lisinopril 40 mg daily.  She denies dizziness, lightheadedness, chest pain, or shortness of breath  Hypothyroidism - during her last visit her TSH showed hyperthyroidism. Her synthroid dose was changed from 50 mcg to 25 mcg   Review of Systems See HPI   Past Medical History:  Diagnosis Date   Allergy    Diabetes mellitus without complication ([place]    Type2    Goiter    Hypertension    Hypothyroid    ITP (idiopathic thrombocytopenic purpura)     Social History   Socioeconomic History   Marital status: Single    Spouse name: Not on file   Number of children: Not on file   Years of education: Not on file   Highest education level: Associate degree: occupational, tHotel manager or vocational program  Occupational History   Not on file  Tobacco Use   Smoking status: Never   Smokeless tobacco: Never   Tobacco comments:    never used tobacco  Vaping Use   Vaping Use: Never used  Substance and Sexual Activity   Alcohol use: Yes    Alcohol/week: 1.0 standard drink of  alcohol    Types: 1 Standard drinks or equivalent per week    Comment: 2-3 drinks a week    Drug use: No   Sexual activity: Not on file  Other Topics Concern   Not on file  Social History Narrative   Works for an iIT consultant   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 Has two grand babies   She enjoys reading and going to the gym. Hanging out with friends.       Social Determinants of Health   Financial Resource Strain: Low Risk  (07/18/2021)   Overall Financial Resource Strain (CARDIA)    Difficulty of Paying Living Expenses: Not hard at all  Food Insecurity: No Food Insecurity (07/18/2021)   Hunger Vital Sign    Worried About Running Out of Food in the Last Year: Never true    Ran Out of Food in the Last Year: Never true  Transportation Needs: No Transportation Needs (07/18/2021)   PRAPARE - THydrologist(Medical): No    Lack of Transportation (Non-Medical): No  Physical Activity: Insufficiently Active (07/18/2021)   Exercise Vital Sign    Days of Exercise per Week: 3 days    Minutes of Exercise per Session: 40 min  Stress: No Stress Concern Present (07/18/2021)   [place]  Institute of Occupational Health - Occupational Stress Questionnaire    Feeling of Stress : Not at all  Social Connections: Moderately Integrated (07/18/2021)   Social Connection and Isolation Panel [NHANES]    Frequency of Communication with Friends and Family: More than three times a week    Frequency of Social Gatherings with Friends and Family: More than three times a week    Attends Religious Services: More than 4 times per year    Active Member of [employer] or Organizations: Yes    Attends Music therapist: More than 4 times per year    Marital Status: Never married  Human resources officer Violence: Not on file    Past Surgical History:  Procedure Laterality Date   no surgeries at this time      Family History  Problem Relation Age of  Onset   Hypertension Mother        fhx   COPD Father    Lung cancer Father    Pancreatic cancer Brother    Colon cancer Sister     No Known Allergies  Current Outpatient Medications on File Prior to Visit  Medication Sig Dispense Refill   albuterol (VENTOLIN HFA) 108 (90 Base) MCG/ACT inhaler Inhale 2 puffs into the lungs every 6 (six) hours as needed for wheezing. 18 each 2   BLACK COHOSH PO Take by mouth.     blood glucose meter kit and supplies KIT Dispense based on patient and insurance preference. Use up to four times daily as directed. 1 each 0   glipiZIDE (GLUCOTROL XL) 10 MG 24 hr tablet TAKE 1 TABLET (10 MG TOTAL) BY MOUTH IN THE MORNING AND AT BEDTIME 180 tablet 0   glucose blood (ACCU-CHEK GUIDE) test strip USE UP TO 4 TIMES DAILY 100 strip 0   Lancets (ACCU-CHEK MULTICLIX) lancets USE TO TEST BLOOD GLUCOSE TWICE DAILY 200 each 3   levothyroxine (SYNTHROID) 25 MCG tablet Take 1 tablet (25 mcg total) by mouth daily. 90 tablet 3   lisinopril (ZESTRIL) 40 MG tablet TAKE 1 TABLET BY MOUTH EVERY DAY 90 tablet 3   Multiple Vitamin (MULTIVITAMIN WITH MINERALS) TABS Take 1 tablet by mouth daily.     omeprazole (PRILOSEC) 20 MG capsule Take 1 capsule (20 mg total) by mouth daily. 90 capsule 1   tirzepatide (MOUNJARO) 12.5 MG/0.5ML Pen Inject 12.5 mg into the skin once a week. 6 mL 0   No current facility-administered medications on file prior to visit.    BP 130/70   Temp 98.1 F (36.7 C) (Oral)   Ht _0  (1.651 m)   Wt 170 lb (77.1 kg)   LMP 05/17/2016   BMI 28.29 kg/m       Objective:   Physical Exam Vitals and nursing note reviewed.  Constitutional:      Appearance: Normal appearance.  Cardiovascular:     Rate and Rhythm: Normal rate and regular rhythm.     Pulses: Normal pulses.     Heart sounds: Normal heart sounds.  Pulmonary:     Breath sounds: Normal breath sounds.  Skin:    General: Skin is warm and dry.  Neurological:     General: No focal deficit  present.     Mental Status: She is alert and oriented to person, place, and time.  Psychiatric:        Mood and Affect: Mood normal.        Behavior: Behavior normal.        Thought  Content: Thought content normal.        Judgment: Judgment normal.       Assessment & Plan:  1. Essential hypertension - Well controlled. No change in medication therapy.   2. Type 2 diabetes mellitus with other specified complication, without long-term current use of insulin (HCC) - A1c 6.1 - doing well. Will have her try decreasing dose of glipizide 10 mg ER daily. If BS goes up then can restart BID dosing - POCT glycosylated hemoglobin (Hb A1C) - glipiZIDE (GLUCOTROL XL) 10 MG 24 hr tablet; Take 1 tablet (10 mg total) by mouth in the morning and at bedtime.  Dispense: 180 tablet; Refill: 0 - tirzepatide (MOUNJARO) 12.5 MG/0.5ML Pen; Inject 12.5 mg into the skin once a week.  Dispense: 6 mL; Refill: 0  3. Acquired hypothyroidism  - TSH; Future - T3, Free; Future - T4, Free; Future - T4, Free - T3, Free - TSH  Dorothyann Peng, NP

## 2022-04-11 NOTE — Patient Instructions (Signed)
Health Maintenance Due  Topic Date Due   DTaP/Tdap/Td (3 - Tdap) 04/24/2018   COVID-19 Vaccine (4 - 2023-24 season) 12/23/2021      Row Labels 01/10/2022    7:55 AM 10/27/2021    8:10 AM 03/16/2021    7:50 AM  Depression screen PHQ 2/9   Section Header. No data exists in this row.     Decreased Interest   0 0 0  Down, Depressed, Hopeless   0 0 0  PHQ - 2 Score   0 0 0  Altered sleeping   0 0 0  Tired, decreased energy   0 0 0  Change in appetite   0 0 0  Feeling bad or failure about yourself    0 0 0  Trouble concentrating   0 0 0  Moving slowly or fidgety/restless   0 0 0  Suicidal thoughts   0 0 0  PHQ-9 Score   0 0 0  Difficult doing work/chores   Not difficult at all Not difficult at all Not difficult at all

## 2022-04-20 ENCOUNTER — Encounter: Payer: Self-pay | Admitting: Adult Health

## 2022-04-25 ENCOUNTER — Other Ambulatory Visit (HOSPITAL_COMMUNITY): Payer: Self-pay

## 2022-05-15 ENCOUNTER — Other Ambulatory Visit (INDEPENDENT_AMBULATORY_CARE_PROVIDER_SITE_OTHER): Payer: 59

## 2022-05-15 DIAGNOSIS — E039 Hypothyroidism, unspecified: Secondary | ICD-10-CM | POA: Diagnosis not present

## 2022-05-15 LAB — TSH: TSH: 0.54 u[IU]/mL (ref 0.35–5.50)

## 2022-06-02 ENCOUNTER — Other Ambulatory Visit (HOSPITAL_COMMUNITY): Payer: Self-pay

## 2022-06-05 ENCOUNTER — Other Ambulatory Visit (HOSPITAL_COMMUNITY): Payer: Self-pay

## 2022-06-09 ENCOUNTER — Other Ambulatory Visit: Payer: Self-pay | Admitting: Adult Health

## 2022-06-09 DIAGNOSIS — I10 Essential (primary) hypertension: Secondary | ICD-10-CM

## 2022-06-09 NOTE — Telephone Encounter (Signed)
  The original prescription was discontinued on 01/10/2022 by Dorothyann Peng, NP. Renewing this prescription may not be appropriate.

## 2022-06-15 ENCOUNTER — Other Ambulatory Visit: Payer: Self-pay | Admitting: Adult Health

## 2022-06-19 ENCOUNTER — Other Ambulatory Visit (HOSPITAL_COMMUNITY): Payer: Self-pay

## 2022-06-21 ENCOUNTER — Other Ambulatory Visit (HOSPITAL_COMMUNITY): Payer: Self-pay

## 2022-07-07 ENCOUNTER — Other Ambulatory Visit: Payer: Self-pay | Admitting: Adult Health

## 2022-07-07 DIAGNOSIS — I10 Essential (primary) hypertension: Secondary | ICD-10-CM

## 2022-07-11 ENCOUNTER — Ambulatory Visit (INDEPENDENT_AMBULATORY_CARE_PROVIDER_SITE_OTHER): Payer: 59 | Admitting: Adult Health

## 2022-07-11 ENCOUNTER — Other Ambulatory Visit (HOSPITAL_COMMUNITY): Payer: Self-pay

## 2022-07-11 ENCOUNTER — Encounter: Payer: Self-pay | Admitting: Adult Health

## 2022-07-11 VITALS — BP 122/80 | HR 91 | Temp 98.2°F | Ht 65.0 in | Wt 171.0 lb

## 2022-07-11 DIAGNOSIS — E1169 Type 2 diabetes mellitus with other specified complication: Secondary | ICD-10-CM | POA: Diagnosis not present

## 2022-07-11 DIAGNOSIS — I10 Essential (primary) hypertension: Secondary | ICD-10-CM

## 2022-07-11 LAB — POCT GLYCOSYLATED HEMOGLOBIN (HGB A1C): Hemoglobin A1C: 6.7 % — AB (ref 4.0–5.6)

## 2022-07-11 MED ORDER — MOUNJARO 12.5 MG/0.5ML ~~LOC~~ SOAJ
12.5000 mg | SUBCUTANEOUS | 1 refills | Status: DC
  Filled 2022-07-11: qty 2, 28d supply, fill #0

## 2022-07-11 MED ORDER — AMLODIPINE BESYLATE 2.5 MG PO TABS
2.5000 mg | ORAL_TABLET | Freq: Every day | ORAL | 1 refills | Status: DC
  Filled 2022-07-11: qty 30, 30d supply, fill #0
  Filled 2022-08-14 – 2022-09-01 (×2): qty 30, 30d supply, fill #1
  Filled 2022-09-20 – 2022-09-26 (×2): qty 30, 30d supply, fill #2
  Filled 2022-10-31: qty 30, 30d supply, fill #3
  Filled 2022-12-06: qty 30, 30d supply, fill #4

## 2022-07-11 NOTE — Progress Notes (Signed)
Subjective:    Patient ID: Autumn Acevedo, female    DOB: 21-Feb-1961, 62 y.o.   MRN: [id number]  HPI  62 year old female who  has a past medical history of Allergy, Diabetes mellitus without complication ([place]), Goiter, Hypertension, Hypothyroid, and ITP (idiopathic thrombocytopenic purpura).  She presents to the office today for 5-month follow-up regarding diabetes and hypertension  Diabetes mellitus type II-currently managed with glipizide 10 mg twice daily and Mounjaro 12.5 mg weekly.  She does monitor her blood sugars at home with readings in the 80s to 120s.  She is working out more and trying to eat healthier.  Starting [facility] her portion sizes have decreased. She does report that she was out of mounjaro for about a month due to back order.  Overall she is feeling well.  Lab Results  Component Value Date   HGBA1C 6.1 (A) 04/11/2022   Wt Readings from Last 3 Encounters:  07/11/22 171 lb (77.6 kg)  04/11/22 170 lb (77.1 kg)  01/10/22 172 lb (78 kg)   Hypertension -managed with Norvasc 2.5 mg daily and lisinopril 40 mg daily.  She denies dizziness, lightheadedness, chest pain, or shortness of breath BP Readings from Last 3 Encounters:  07/11/22 122/80  04/11/22 130/70  01/10/22 110/70   Review of Systems See HPI   Past Medical History:  Diagnosis Date   Allergy    Diabetes mellitus without complication ([place])    Type2    Goiter    Hypertension    Hypothyroid    ITP (idiopathic thrombocytopenic purpura)     Social History   Socioeconomic History   Marital status: Single    Spouse name: Not on file   Number of children: Not on file   Years of education: Not on file   Highest education level: Associate degree: occupational, Hotel manager, or vocational program  Occupational History   Not on file  Tobacco Use   Smoking status: Never   Smokeless tobacco: Never   Tobacco comments:    never used tobacco  Vaping Use   Vaping Use: Never used  Substance and Sexual  Activity   Alcohol use: Yes    Alcohol/week: 1.0 standard drink of alcohol    Types: 1 Standard drinks or equivalent per week    Comment: 2-3 drinks a week    Drug use: No   Sexual activity: Not on file  Other Topics Concern   Not on file  Social History Narrative   Works for an IT consultant    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 Has two grand babies   She enjoys reading and going to the gym. Hanging out with friends.       Social Determinants of Health   Financial Resource Strain: Low Risk  (07/18/2021)   Overall Financial Resource Strain (CARDIA)    Difficulty of Paying Living Expenses: Not hard at all  Food Insecurity: No Food Insecurity (07/18/2021)   Hunger Vital Sign    Worried About Running Out of Food in the Last Year: Never true    Ran Out of Food in the Last Year: Never true  Transportation Needs: No Transportation Needs (07/18/2021)   PRAPARE - Hydrologist (Medical): No    Lack of Transportation (Non-Medical): No  Physical Activity: Insufficiently Active (07/18/2021)   Exercise Vital Sign    Days of Exercise per Week: 3 days    Minutes of  Exercise per Session: 40 min  Stress: No Stress Concern Present (07/18/2021)   [place]    Feeling of Stress : Not at all  Social Connections: Moderately Integrated (07/18/2021)   Social Connection and Isolation Panel [NHANES]    Frequency of Communication with Friends and Family: More than three times a week    Frequency of Social Gatherings with Friends and Family: More than three times a week    Attends Religious Services: More than 4 times per year    Active Member of [employer] or Organizations: Yes    Attends Music therapist: More than 4 times per year    Marital Status: Never married  Human resources officer Violence: Not on file    Past Surgical History:  Procedure Laterality Date   no  surgeries at this time      Family History  Problem Relation Age of Onset   Hypertension Mother        fhx   COPD Father    Lung cancer Father    Pancreatic cancer Brother    Colon cancer Sister     No Known Allergies  Current Outpatient Medications on File Prior to Visit  Medication Sig Dispense Refill   albuterol (VENTOLIN HFA) 108 (90 Base) MCG/ACT inhaler Inhale 2 puffs into the lungs every 6 (six) hours as needed for wheezing. 18 each 2   BLACK COHOSH PO Take by mouth.     blood glucose meter kit and supplies KIT Dispense based on patient and insurance preference. Use up to four times daily as directed. 1 each 0   glipiZIDE (GLUCOTROL XL) 10 MG 24 hr tablet Take 1 tablet (10 mg total) by mouth in the morning and at bedtime. 180 tablet 0   glucose blood (ACCU-CHEK GUIDE) test strip USE UP TO 4 TIMES DAILY 100 strip 0   Lancets (ACCU-CHEK MULTICLIX) lancets USE TO TEST BLOOD GLUCOSE TWICE DAILY 200 each 3   levothyroxine (SYNTHROID) 25 MCG tablet Take 1 tablet (25 mcg total) by mouth daily. 90 tablet 3   lisinopril (ZESTRIL) 40 MG tablet TAKE 1 TABLET BY MOUTH EVERY DAY 90 tablet 3   Multiple Vitamin (MULTIVITAMIN WITH MINERALS) TABS Take 1 tablet by mouth daily.     omeprazole (PRILOSEC) 20 MG capsule Take 1 capsule (20 mg total) by mouth daily. 90 capsule 1   tirzepatide (MOUNJARO) 12.5 MG/0.5ML Pen Inject 12.5 mg into the skin once a week. 6 mL 0   No current facility-administered medications on file prior to visit.    BP 122/80   Pulse 91   Temp 98.2 F (36.8 C) (Oral)   Ht 5\' 5"  (1.651 m)   Wt 171 lb (77.6 kg)   LMP 05/17/2016   SpO2 94%   BMI 28.46 kg/m       Objective:   Physical Exam Vitals and nursing note reviewed.  Constitutional:      Appearance: Normal appearance. She is obese.  Cardiovascular:     Rate and Rhythm: Normal rate and regular rhythm.     Pulses: Normal pulses.     Heart sounds: Normal heart sounds.  Pulmonary:     Effort: Pulmonary  effort is normal.     Breath sounds: Normal breath sounds.  Skin:    General: Skin is warm and dry.  Neurological:     General: No focal deficit present.     Mental Status: She is alert and oriented to person,  place, and time.  Psychiatric:        Mood and Affect: Mood normal.        Behavior: Behavior normal.        Thought Content: Thought content normal.        Judgment: Judgment normal.       Assessment & Plan:  1. Essential hypertension - well controlled.  - amLODipine (NORVASC) 2.5 MG tablet; Take 1 tablet (2.5 mg total) by mouth daily.  Dispense: 90 tablet; Refill: 1  2. Type 2 diabetes mellitus with other specified complication, without long-term current use of insulin (HCC)  - tirzepatide (MOUNJARO) 12.5 MG/0.5ML Pen; Inject 12.5 mg into the skin once a week.  Dispense: 6 mL; Refill: 1 - POC HgB A1c- 6.7  - Slightly elevated due to being out of Mounjaro. Will try six month follow up   Dorothyann Peng, NP    Dorothyann Peng, NP

## 2022-07-11 NOTE — Patient Instructions (Addendum)
Your A1c is 6.7   Please follow up in 6 month for your physical exam   If you need anything please let me know

## 2022-07-13 ENCOUNTER — Other Ambulatory Visit (HOSPITAL_COMMUNITY): Payer: Self-pay

## 2022-07-20 ENCOUNTER — Other Ambulatory Visit (HOSPITAL_COMMUNITY): Payer: Self-pay

## 2022-07-27 ENCOUNTER — Encounter: Payer: Self-pay | Admitting: Adult Health

## 2022-07-27 DIAGNOSIS — E1169 Type 2 diabetes mellitus with other specified complication: Secondary | ICD-10-CM

## 2022-07-28 ENCOUNTER — Other Ambulatory Visit (HOSPITAL_COMMUNITY): Payer: Self-pay

## 2022-07-28 MED ORDER — MOUNJARO 12.5 MG/0.5ML ~~LOC~~ SOAJ
12.5000 mg | SUBCUTANEOUS | 1 refills | Status: DC
  Filled 2022-07-28: qty 2, 28d supply, fill #0
  Filled 2022-08-23 – 2022-09-01 (×3): qty 2, 28d supply, fill #1
  Filled 2022-09-29: qty 2, 28d supply, fill #2
  Filled 2022-10-31 – 2022-11-22 (×2): qty 2, 28d supply, fill #3
  Filled 2022-12-20: qty 2, 28d supply, fill #4

## 2022-07-28 NOTE — Telephone Encounter (Signed)
Spoke to pt and she called all the pharmacy but no one had this strength. Pt advise PCP is out of office until next Tuesday. Pt stated she will have blood sugar readings next week for PCP to look at. Please advise

## 2022-08-03 ENCOUNTER — Other Ambulatory Visit (HOSPITAL_COMMUNITY): Payer: Self-pay

## 2022-08-14 ENCOUNTER — Other Ambulatory Visit (HOSPITAL_COMMUNITY): Payer: Self-pay

## 2022-08-15 ENCOUNTER — Other Ambulatory Visit (HOSPITAL_COMMUNITY): Payer: Self-pay

## 2022-08-23 ENCOUNTER — Other Ambulatory Visit (HOSPITAL_COMMUNITY): Payer: Self-pay

## 2022-08-28 ENCOUNTER — Other Ambulatory Visit (HOSPITAL_COMMUNITY): Payer: Self-pay

## 2022-08-31 ENCOUNTER — Other Ambulatory Visit (HOSPITAL_COMMUNITY): Payer: Self-pay

## 2022-09-01 ENCOUNTER — Other Ambulatory Visit (HOSPITAL_COMMUNITY): Payer: Self-pay

## 2022-09-20 ENCOUNTER — Other Ambulatory Visit (HOSPITAL_COMMUNITY): Payer: Self-pay

## 2022-09-20 ENCOUNTER — Encounter: Payer: Self-pay | Admitting: Adult Health

## 2022-09-20 DIAGNOSIS — I10 Essential (primary) hypertension: Secondary | ICD-10-CM

## 2022-09-20 MED ORDER — LISINOPRIL 40 MG PO TABS
40.0000 mg | ORAL_TABLET | Freq: Every day | ORAL | 0 refills | Status: DC
  Filled 2022-09-20: qty 30, 30d supply, fill #0
  Filled 2022-10-31: qty 30, 30d supply, fill #1
  Filled 2022-12-06: qty 30, 30d supply, fill #2

## 2022-09-21 ENCOUNTER — Other Ambulatory Visit (HOSPITAL_COMMUNITY): Payer: Self-pay

## 2022-09-29 ENCOUNTER — Other Ambulatory Visit (HOSPITAL_COMMUNITY): Payer: Self-pay

## 2022-10-06 ENCOUNTER — Other Ambulatory Visit (HOSPITAL_COMMUNITY): Payer: Self-pay

## 2022-10-18 ENCOUNTER — Encounter: Payer: Self-pay | Admitting: Adult Health

## 2022-10-18 ENCOUNTER — Other Ambulatory Visit (HOSPITAL_COMMUNITY): Payer: Self-pay

## 2022-10-18 DIAGNOSIS — E139 Other specified diabetes mellitus without complications: Secondary | ICD-10-CM

## 2022-10-18 MED ORDER — ACCU-CHEK GUIDE VI STRP
ORAL_STRIP | 0 refills | Status: DC
  Filled 2022-10-18: qty 100, 25d supply, fill #0

## 2022-10-20 ENCOUNTER — Other Ambulatory Visit (HOSPITAL_COMMUNITY): Payer: Self-pay

## 2022-10-31 ENCOUNTER — Other Ambulatory Visit (HOSPITAL_COMMUNITY): Payer: Self-pay

## 2022-10-31 ENCOUNTER — Other Ambulatory Visit: Payer: Self-pay

## 2022-11-09 ENCOUNTER — Other Ambulatory Visit (HOSPITAL_COMMUNITY): Payer: Self-pay

## 2022-11-22 ENCOUNTER — Other Ambulatory Visit (HOSPITAL_COMMUNITY): Payer: Self-pay

## 2022-12-06 ENCOUNTER — Other Ambulatory Visit: Payer: Self-pay | Admitting: Adult Health

## 2022-12-06 ENCOUNTER — Other Ambulatory Visit: Payer: Self-pay

## 2022-12-06 DIAGNOSIS — E1169 Type 2 diabetes mellitus with other specified complication: Secondary | ICD-10-CM

## 2022-12-07 ENCOUNTER — Other Ambulatory Visit (HOSPITAL_COMMUNITY): Payer: Self-pay

## 2022-12-07 ENCOUNTER — Encounter (INDEPENDENT_AMBULATORY_CARE_PROVIDER_SITE_OTHER): Payer: Self-pay

## 2022-12-07 MED ORDER — GLIPIZIDE ER 10 MG PO TB24
10.0000 mg | ORAL_TABLET | Freq: Two times a day (BID) | ORAL | 0 refills | Status: DC
  Filled 2022-12-07: qty 60, 30d supply, fill #0
  Filled 2023-01-16: qty 60, 30d supply, fill #1
  Filled 2023-02-15: qty 60, 30d supply, fill #2

## 2022-12-08 ENCOUNTER — Ambulatory Visit: Payer: 59 | Admitting: Adult Health

## 2022-12-08 VITALS — BP 132/96 | HR 81 | Temp 98.0°F | Ht 65.0 in | Wt 174.2 lb

## 2022-12-08 DIAGNOSIS — J069 Acute upper respiratory infection, unspecified: Secondary | ICD-10-CM | POA: Diagnosis not present

## 2022-12-08 DIAGNOSIS — J029 Acute pharyngitis, unspecified: Secondary | ICD-10-CM

## 2022-12-08 LAB — POC COVID19 BINAXNOW: SARS Coronavirus 2 Ag: NEGATIVE

## 2022-12-08 LAB — POCT RAPID STREP A (OFFICE): Rapid Strep A Screen: NEGATIVE

## 2022-12-08 NOTE — Progress Notes (Signed)
Subjective:    Patient ID: Autumn Acevedo, female    DOB: 1960-12-01, 62 y.o.   MRN: [id number]  HPI 62 year old female who  has a past medical history of Allergy, Diabetes mellitus without complication (HCC), Goiter, Hypertension, Hypothyroid, and ITP (idiopathic thrombocytopenic purpura).  She presents to the office today for an acute issue.  She reports that her symptoms started roughly 2 days ago with sore throat and nasal congestion.  She started using Claritin at the symptoms.  When she woke up this morning she did feel little bit better but continues to have have symptoms of sore throat, nasal congestion and developed a only productive cough this morning.  Has not had any fevers, chills, nausea, vomiting, diarrhea.   Review of Systems See HPI   Past Medical History:  Diagnosis Date   Allergy    Diabetes mellitus without complication (HCC)    Type2    Goiter    Hypertension    Hypothyroid    ITP (idiopathic thrombocytopenic purpura)     Social History   Socioeconomic History   Marital status: Single    Spouse name: Not on file   Number of children: Not on file   Years of education: Not on file   Highest education level: Associate degree: occupational, Scientist, product/process development, or vocational program  Occupational History   Not on file  Tobacco Use   Smoking status: Never   Smokeless tobacco: Never   Tobacco comments:    never used tobacco  Vaping Use   Vaping status: Never Used  Substance and Sexual Activity   Alcohol use: Yes    Alcohol/week: 1.0 standard drink of alcohol    Types: 1 Standard drinks or equivalent per week    Comment: 2-3 drinks a week    Drug use: No   Sexual activity: Not on file  Other Topics Concern   Not on file  Social History Narrative   Works for an Scientist, forensic    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 Has two grand babies   She enjoys reading and going to the gym. Hanging out with friends.       Social  Determinants of Health   Financial Resource Strain: Low Risk  (07/18/2021)   Overall Financial Resource Strain (CARDIA)    Difficulty of Paying Living Expenses: Not hard at all  Food Insecurity: No Food Insecurity (07/18/2021)   Hunger Vital Sign    Worried About Running Out of Food in the Last Year: Never true    Ran Out of Food in the Last Year: Never true  Transportation Needs: No Transportation Needs (07/18/2021)   PRAPARE - Administrator, Civil Service (Medical): No    Lack of Transportation (Non-Medical): No  Physical Activity: Insufficiently Active (07/18/2021)   Exercise Vital Sign    Days of Exercise per Week: 3 days    Minutes of Exercise per Session: 40 min  Stress: No Stress Concern Present (07/18/2021)   [employer] of Occupational Health - Occupational Stress Questionnaire    Feeling of Stress : Not at all  Social Connections: Moderately Integrated (07/18/2021)   Social Connection and Isolation Panel [NHANES]    Frequency of Communication with Friends and Family: More than three times a week    Frequency of Social Gatherings with Friends and Family: More than three times a week    Attends Religious Services: More than 4 times per  year    Active Member of Clubs or Organizations: Yes    Attends Engineer, structural: More than 4 times per year    Marital Status: Never married  Intimate Partner Violence: Not on file    Past Surgical History:  Procedure Laterality Date   no surgeries at this time      Family History  Problem Relation Age of Onset   Hypertension Mother        fhx   COPD Father    Lung cancer Father    Pancreatic cancer Brother    Colon cancer Sister     No Known Allergies  Current Outpatient Medications on File Prior to Visit  Medication Sig Dispense Refill   albuterol (VENTOLIN HFA) 108 (90 Base) MCG/ACT inhaler Inhale 2 puffs into the lungs every 6 (six) hours as needed for wheezing. 18 each 2   amLODipine (NORVASC)  2.5 MG tablet Take 1 tablet (2.5 mg total) by mouth daily. 90 tablet 1   BLACK COHOSH PO Take by mouth.     blood glucose meter kit and supplies KIT Dispense based on patient and insurance preference. Use up to four times daily as directed. 1 each 0   glipiZIDE (GLUCOTROL XL) 10 MG 24 hr tablet Take 1 tablet (10 mg total) by mouth in the morning and at bedtime. 180 tablet 0   glucose blood (ACCU-CHEK GUIDE) test strip USE UP TO 4 TIMES DAILY 100 strip 0   Lancets (ACCU-CHEK MULTICLIX) lancets USE TO TEST BLOOD GLUCOSE TWICE DAILY 200 each 3   lisinopril (ZESTRIL) 40 MG tablet Take 1 tablet (40 mg total) by mouth daily. 90 tablet 0   Multiple Vitamin (MULTIVITAMIN WITH MINERALS) TABS Take 1 tablet by mouth daily.     omeprazole (PRILOSEC) 20 MG capsule Take 1 capsule (20 mg total) by mouth daily. 90 capsule 1   tirzepatide (MOUNJARO) 12.5 MG/0.5ML Pen Inject 12.5 mg into the skin once a week. 6 mL 1   No current facility-administered medications on file prior to visit.    BP (!) 132/96 (BP Location: Left Arm, Patient Position: Sitting, Cuff Size: Large)   Pulse 81   Temp 98 F (36.7 C) (Oral)   Ht 5\' 5"  (1.651 m)   Wt 174 lb 3.2 oz (79 kg)   LMP 05/17/2016   SpO2 100%   BMI 28.99 kg/m       Objective:   Physical Exam Vitals and nursing note reviewed.  Constitutional:      Appearance: Normal appearance.  HENT:     Right Ear: A middle ear effusion is present. Tympanic membrane is not erythematous.     Left Ear: A middle ear effusion is present. Tympanic membrane is not erythematous.     Nose: Congestion present. No rhinorrhea.     Right Turbinates: Enlarged and swollen.     Left Turbinates: Enlarged and swollen.     Right Sinus: Frontal sinus tenderness present.     Left Sinus: Frontal sinus tenderness present.     Mouth/Throat:     Mouth: Mucous membranes are moist.     Pharynx: Uvula midline. No pharyngeal swelling or posterior oropharyngeal erythema.     Comments: +  PND Cardiovascular:     Rate and Rhythm: Normal rate and regular rhythm.     Heart sounds: Normal heart sounds.  Pulmonary:     Effort: Pulmonary effort is normal.     Breath sounds: Normal breath sounds.  Musculoskeletal:  General: Normal range of motion.  Skin:    General: Skin is warm and dry.  Neurological:     General: No focal deficit present.     Mental Status: She is alert and oriented to person, place, and time.  Psychiatric:        Mood and Affect: Mood normal.        Behavior: Behavior normal.        Thought Content: Thought content normal.        Judgment: Judgment normal.       Assessment & Plan:   1. Viral upper respiratory tract infection -Symptoms improving to some degree, likely viral upper respiratory infection.  Encouraged to continue Claritin and can add Flonase.  Rest and hydrate  2. Sore throat - Likely from PND - POC Rapid Strep A- negative - POC COVID-19 - negative   Shirline Frees, NP

## 2022-12-11 ENCOUNTER — Other Ambulatory Visit (HOSPITAL_COMMUNITY): Payer: Self-pay

## 2022-12-26 ENCOUNTER — Encounter: Payer: Self-pay | Admitting: Pharmacist

## 2022-12-27 ENCOUNTER — Ambulatory Visit (INDEPENDENT_AMBULATORY_CARE_PROVIDER_SITE_OTHER): Payer: 59 | Admitting: Adult Health

## 2022-12-27 ENCOUNTER — Other Ambulatory Visit (HOSPITAL_COMMUNITY): Payer: Self-pay

## 2022-12-27 ENCOUNTER — Encounter: Payer: Self-pay | Admitting: Adult Health

## 2022-12-27 VITALS — BP 130/88 | HR 79 | Temp 98.2°F | Ht 65.0 in | Wt 173.0 lb

## 2022-12-27 DIAGNOSIS — Z7985 Long-term (current) use of injectable non-insulin antidiabetic drugs: Secondary | ICD-10-CM

## 2022-12-27 DIAGNOSIS — E039 Hypothyroidism, unspecified: Secondary | ICD-10-CM

## 2022-12-27 DIAGNOSIS — Z0000 Encounter for general adult medical examination without abnormal findings: Secondary | ICD-10-CM | POA: Diagnosis not present

## 2022-12-27 DIAGNOSIS — Z7984 Long term (current) use of oral hypoglycemic drugs: Secondary | ICD-10-CM

## 2022-12-27 DIAGNOSIS — E119 Type 2 diabetes mellitus without complications: Secondary | ICD-10-CM | POA: Diagnosis not present

## 2022-12-27 DIAGNOSIS — Z13220 Encounter for screening for lipoid disorders: Secondary | ICD-10-CM | POA: Diagnosis not present

## 2022-12-27 DIAGNOSIS — Z23 Encounter for immunization: Secondary | ICD-10-CM | POA: Diagnosis not present

## 2022-12-27 DIAGNOSIS — I10 Essential (primary) hypertension: Secondary | ICD-10-CM | POA: Diagnosis not present

## 2022-12-27 DIAGNOSIS — Z794 Long term (current) use of insulin: Secondary | ICD-10-CM | POA: Diagnosis not present

## 2022-12-27 DIAGNOSIS — E049 Nontoxic goiter, unspecified: Secondary | ICD-10-CM

## 2022-12-27 LAB — COMPREHENSIVE METABOLIC PANEL
ALT: 46 U/L — ABNORMAL HIGH (ref 0–35)
AST: 45 U/L — ABNORMAL HIGH (ref 0–37)
Albumin: 4.2 g/dL (ref 3.5–5.2)
Alkaline Phosphatase: 129 U/L — ABNORMAL HIGH (ref 39–117)
BUN: 15 mg/dL (ref 6–23)
CO2: 29 meq/L (ref 19–32)
Calcium: 9.5 mg/dL (ref 8.4–10.5)
Chloride: 106 meq/L (ref 96–112)
Creatinine, Ser: 0.97 mg/dL (ref 0.40–1.20)
GFR: 62.97 mL/min (ref >60.00)
Glucose, Bld: 132 mg/dL — ABNORMAL HIGH (ref 70–99)
Potassium: 4.1 meq/L (ref 3.5–5.1)
Sodium: 142 meq/L (ref 135–145)
Total Bilirubin: 0.6 mg/dL (ref 0.2–1.2)
Total Protein: 7.7 g/dL (ref 6.0–8.3)

## 2022-12-27 LAB — CBC WITH DIFFERENTIAL/PLATELET
Basophils Absolute: 0 10*3/uL (ref 0.0–0.1)
Basophils Relative: 0.5 % (ref 0.0–3.0)
Eosinophils Absolute: 0.2 10*3/uL (ref 0.0–0.7)
Eosinophils Relative: 4 % (ref 0.0–5.0)
HCT: 43.1 % (ref 36.0–46.0)
Hemoglobin: 13.2 g/dL (ref 12.0–15.0)
Lymphocytes Relative: 47.2 % — ABNORMAL HIGH (ref 12.0–46.0)
Lymphs Abs: 2.6 10*3/uL (ref 0.7–4.0)
MCHC: 30.8 g/dL (ref 30.0–36.0)
MCV: 73.9 fl — ABNORMAL LOW (ref 78.0–100.0)
Monocytes Absolute: 0.5 10*3/uL (ref 0.1–1.0)
Monocytes Relative: 8.8 % (ref 3.0–12.0)
Neutro Abs: 2.2 10*3/uL (ref 1.4–7.7)
Neutrophils Relative %: 39.5 % — ABNORMAL LOW (ref 43.0–77.0)
Platelets: 265 10*3/uL (ref 150.0–400.0)
RBC: 5.82 Mil/uL — ABNORMAL HIGH (ref 3.87–5.11)
RDW: 14.9 % (ref 11.5–15.5)
WBC: 5.6 10*3/uL (ref 4.0–10.5)

## 2022-12-27 LAB — MICROALBUMIN / CREATININE URINE RATIO
Creatinine,U: 188.2 mg/dL
Microalb Creat Ratio: 0.9 mg/g (ref 0.0–30.0)
Microalb, Ur: 1.7 mg/dL (ref 0.0–1.9)

## 2022-12-27 LAB — TSH: TSH: 0.35 u[IU]/mL (ref 0.35–5.50)

## 2022-12-27 LAB — LIPID PANEL
Cholesterol: 160 mg/dL (ref 0–200)
HDL: 41.5 mg/dL (ref >39.00)
LDL Cholesterol: 96 mg/dL (ref 0–99)
NonHDL: 118.58
Total CHOL/HDL Ratio: 4
Triglycerides: 112 mg/dL (ref 0.0–149.0)
VLDL: 22.4 mg/dL (ref 0.0–40.0)

## 2022-12-27 LAB — HEMOGLOBIN A1C: Hgb A1c MFr Bld: 8.1 % — ABNORMAL HIGH (ref 4.6–6.5)

## 2022-12-27 MED ORDER — TIRZEPATIDE 15 MG/0.5ML ~~LOC~~ SOAJ
15.0000 mg | SUBCUTANEOUS | 0 refills | Status: DC
Start: 2022-12-27 — End: 2023-03-16
  Filled 2022-12-27: qty 2, 28d supply, fill #0
  Filled 2023-01-16 – 2023-01-17 (×2): qty 2, 28d supply, fill #1
  Filled 2023-02-12: qty 2, 28d supply, fill #2

## 2022-12-27 MED ORDER — AMLODIPINE BESYLATE 2.5 MG PO TABS
2.5000 mg | ORAL_TABLET | Freq: Every day | ORAL | 3 refills | Status: DC
Start: 2022-12-27 — End: 2024-01-02
  Filled 2022-12-27 – 2023-01-16 (×2): qty 30, 30d supply, fill #0
  Filled 2023-02-15: qty 30, 30d supply, fill #1
  Filled 2023-03-16: qty 30, 30d supply, fill #2
  Filled 2023-04-13: qty 30, 30d supply, fill #3
  Filled 2023-05-14: qty 30, 30d supply, fill #4
  Filled 2023-06-08: qty 30, 30d supply, fill #5
  Filled 2023-07-05: qty 30, 30d supply, fill #6
  Filled 2023-08-08 – 2023-08-21 (×2): qty 30, 30d supply, fill #7
  Filled 2023-09-14: qty 30, 30d supply, fill #8
  Filled 2023-10-15: qty 30, 30d supply, fill #9
  Filled 2023-11-13: qty 30, 30d supply, fill #10
  Filled 2023-12-10: qty 30, 30d supply, fill #11

## 2022-12-27 MED ORDER — LISINOPRIL 40 MG PO TABS
40.0000 mg | ORAL_TABLET | Freq: Every day | ORAL | 3 refills | Status: DC
  Filled 2022-12-27 – 2023-01-16 (×2): qty 30, 30d supply, fill #0
  Filled 2023-02-15: qty 30, 30d supply, fill #1
  Filled 2023-03-16: qty 30, 30d supply, fill #2
  Filled 2023-04-13: qty 30, 30d supply, fill #3
  Filled 2023-05-14: qty 30, 30d supply, fill #4
  Filled 2023-06-08: qty 30, 30d supply, fill #5
  Filled 2023-07-05: qty 30, 30d supply, fill #6
  Filled 2023-08-08 – 2023-08-21 (×2): qty 30, 30d supply, fill #7
  Filled 2023-09-14: qty 30, 30d supply, fill #8
  Filled 2023-10-15: qty 30, 30d supply, fill #9
  Filled 2023-11-13: qty 30, 30d supply, fill #10
  Filled 2023-12-10: qty 30, 30d supply, fill #11

## 2022-12-27 NOTE — Addendum Note (Signed)
Addended by: Waymon Amato R on: 12/27/2022 08:05 AM   Modules accepted: Orders

## 2022-12-27 NOTE — Progress Notes (Signed)
Subjective:    Patient ID: Autumn Acevedo, female    DOB: 1961/02/09, 62 y.o.   MRN: [id number]  HPI Patient presents for yearly preventative medicine examination. She is a pleasant 62 year old female who  has a past medical history of Allergy, Diabetes mellitus without complication (HCC), Goiter, Hypertension, Hypothyroid, and ITP (idiopathic thrombocytopenic purpura).  Diabetes mellitus type II-currently managed with glipizide 10 mg twice daily and Mounjaro 12.5 mg weekly.  She does monitor her blood sugars at home with readings in the 80s to 120s.  She is working out more and trying to eat healthier.  Since starting [facility] her portion sizes have decreased. She does report that she was out of mounjaro for about a month due to back order.  Overall she is feeling well.  Lab Results  Component Value Date   HGBA1C 6.7 (A) 07/11/2022    Wt Readings from Last 3 Encounters:  12/27/22 173 lb (78.5 kg)  12/08/22 174 lb 3.2 oz (79 kg)  07/11/22 171 lb (77.6 kg)   Hypertension -managed with Norvasc 2.5 mg daily and lisinopril 40 mg daily.  She denies dizziness, lightheadedness, chest pain, or shortness of breath BP Readings from Last 3 Encounters:  12/27/22 130/88  12/08/22 (!) 132/96  07/11/22 122/80   Hypothyroidism We d/c Synthroid back in January 2024. She does have a goiter and feels like it is becoming larger. She sometimes has trouble swallowing. We referred her to [facility] at the beginning of the year but she never heard anything from them.   All immunizations and health maintenance protocols were reviewed with the patient and needed orders were placed.  Appropriate screening laboratory values were ordered for the patient including screening of hyperlipidemia, renal function and hepatic function.  Medication reconciliation,  past medical history, social history, problem list and allergies were reviewed in detail with the patient  Goals were established with  regard to weight loss, exercise, and  diet in compliance with medications  Wt Readings from Last 3 Encounters:  12/27/22 173 lb (78.5 kg)  12/08/22 174 lb 3.2 oz (79 kg)  07/11/22 171 lb (77.6 kg)   She is up to date on routine colon cancer screening and mammogram. She is going to set up her GYN exam   Review of Systems  Constitutional: Negative.   HENT: Negative.    Eyes: Negative.   Respiratory: Negative.    Cardiovascular: Negative.   Gastrointestinal: Negative.   Endocrine: Negative.   Genitourinary: Negative.   Musculoskeletal: Negative.   Skin: Negative.   Allergic/Immunologic: Negative.   Neurological: Negative.   Hematological: Negative.   Psychiatric/Behavioral: Negative.     Past Medical History:  Diagnosis Date   Allergy    Diabetes mellitus without complication (HCC)    Type2    Goiter    Hypertension    Hypothyroid    ITP (idiopathic thrombocytopenic purpura)     Social History   Socioeconomic History   Marital status: Single    Spouse name: Not on file   Number of children: Not on file   Years of education: Not on file   Highest education level: Associate degree: occupational, Scientist, product/process development, or vocational program  Occupational History   Not on file  Tobacco Use   Smoking status: Never   Smokeless tobacco: Never   Tobacco comments:    never used tobacco  Vaping Use   Vaping status: Never Used  Substance and Sexual Activity   Alcohol use: Yes  Alcohol/week: 1.0 standard drink of alcohol    Types: 1 Standard drinks or equivalent per week    Comment: 2-3 drinks a week    Drug use: No   Sexual activity: Not on file  Other Topics Concern   Not on file  Social History Narrative   Works for an Scientist, forensic    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 Has two grand babies   She enjoys reading and going to the gym. Hanging out with friends.       Social Determinants of Health   Financial Resource Strain: Low Risk   (07/18/2021)   Overall Financial Resource Strain (CARDIA)    Difficulty of Paying Living Expenses: Not hard at all  Food Insecurity: No Food Insecurity (07/18/2021)   Hunger Vital Sign    Worried About Running Out of Food in the Last Year: Never true    Ran Out of Food in the Last Year: Never true  Transportation Needs: No Transportation Needs (07/18/2021)   PRAPARE - Administrator, Civil Service (Medical): No    Lack of Transportation (Non-Medical): No  Physical Activity: Insufficiently Active (07/18/2021)   Exercise Vital Sign    Days of Exercise per Week: 3 days    Minutes of Exercise per Session: 40 min  Stress: No Stress Concern Present (07/18/2021)   [employer] of Occupational Health - Occupational Stress Questionnaire    Feeling of Stress : Not at all  Social Connections: Moderately Integrated (07/18/2021)   Social Connection and Isolation Panel [NHANES]    Frequency of Communication with Friends and Family: More than three times a week    Frequency of Social Gatherings with Friends and Family: More than three times a week    Attends Religious Services: More than 4 times per year    Active Member of [employer] or Organizations: Yes    Attends Engineer, structural: More than 4 times per year    Marital Status: Never married  Catering manager Violence: Not on file    Past Surgical History:  Procedure Laterality Date   no surgeries at this time      Family History  Problem Relation Age of Onset   Hypertension Mother        fhx   COPD Father    Lung cancer Father    Pancreatic cancer Brother    Colon cancer Sister     No Known Allergies  Current Outpatient Medications on File Prior to Visit  Medication Sig Dispense Refill   albuterol (VENTOLIN HFA) 108 (90 Base) MCG/ACT inhaler Inhale 2 puffs into the lungs every 6 (six) hours as needed for wheezing. 18 each 2   amLODipine (NORVASC) 2.5 MG tablet Take 1 tablet (2.5 mg total) by mouth daily. 90  tablet 1   BLACK COHOSH PO Take by mouth.     blood glucose meter kit and supplies KIT Dispense based on patient and insurance preference. Use up to four times daily as directed. 1 each 0   glipiZIDE (GLUCOTROL XL) 10 MG 24 hr tablet Take 1 tablet (10 mg total) by mouth in the morning and at bedtime. 180 tablet 0   glucose blood (ACCU-CHEK GUIDE) test strip USE UP TO 4 TIMES DAILY 100 strip 0   Lancets (ACCU-CHEK MULTICLIX) lancets USE TO TEST BLOOD GLUCOSE TWICE DAILY 200 each 3   lisinopril (ZESTRIL) 40 MG tablet Take 1 tablet (40 mg total)  by mouth daily. 90 tablet 0   Multiple Vitamin (MULTIVITAMIN WITH MINERALS) TABS Take 1 tablet by mouth daily.     omeprazole (PRILOSEC) 20 MG capsule Take 1 capsule (20 mg total) by mouth daily. 90 capsule 1   tirzepatide (MOUNJARO) 12.5 MG/0.5ML Pen Inject 12.5 mg into the skin once a week. 6 mL 1   No current facility-administered medications on file prior to visit.    BP 130/88   Pulse 79   Temp 98.2 F (36.8 C) (Oral)   Ht 5\' 5"  (1.651 m)   Wt 173 lb (78.5 kg)   LMP 05/17/2016   SpO2 98%   BMI 28.79 kg/m       Objective:   Physical Exam Vitals and nursing note reviewed.  Constitutional:      General: She is not in acute distress.    Appearance: Normal appearance. She is not ill-appearing.  HENT:     Head: Normocephalic and atraumatic.     Right Ear: Tympanic membrane, ear canal and external ear normal. There is no impacted cerumen.     Left Ear: Tympanic membrane, ear canal and external ear normal. There is no impacted cerumen.     Nose: Nose normal. No congestion or rhinorrhea.     Mouth/Throat:     Mouth: Mucous membranes are moist.     Pharynx: Oropharynx is clear.  Eyes:     Extraocular Movements: Extraocular movements intact.     Conjunctiva/sclera: Conjunctivae normal.     Pupils: Pupils are equal, round, and reactive to light.  Neck:     Vascular: No carotid bruit.     Comments: + Goiter  Cardiovascular:     Rate and  Rhythm: Normal rate and regular rhythm.     Pulses: Normal pulses.     Heart sounds: No murmur heard.    No friction rub. No gallop.  Pulmonary:     Effort: Pulmonary effort is normal.     Breath sounds: Normal breath sounds.  Abdominal:     General: Abdomen is flat. Bowel sounds are normal. There is no distension.     Palpations: Abdomen is soft. There is no mass.     Tenderness: There is no abdominal tenderness. There is no guarding or rebound.     Hernia: No hernia is present.  Musculoskeletal:        General: Normal range of motion.     Cervical back: Normal range of motion and neck supple.  Lymphadenopathy:     Cervical: No cervical adenopathy.  Skin:    General: Skin is warm and dry.     Capillary Refill: Capillary refill takes less than 2 seconds.  Neurological:     General: No focal deficit present.     Mental Status: She is alert and oriented to person, place, and time.  Psychiatric:        Mood and Affect: Mood normal.        Behavior: Behavior normal.        Thought Content: Thought content normal.        Judgment: Judgment normal.        Assessment & Plan:  1. Routine general medical examination at a health care facility Today patient counseled on age appropriate routine health concerns for screening and prevention, each reviewed and up to date or declined. Immunizations reviewed and up to date or declined. Labs ordered and reviewed. Risk factors for depression reviewed and negative. Hearing function and visual acuity are intact.  ADLs screened and addressed as needed. Functional ability and level of safety reviewed and appropriate. Education, counseling and referrals performed based on assessed risks today. Patient provided with a copy of personalized plan for preventive services. - Follow up in one year or sooner if needed  2. Long-term current use of injectable noninsulin antidiabetic medication - Will increase Mounjaro to 15 mg  - Follow upin 3 or 6 months  depending on A1c  - CBC with Differential/Platelet; Future - Comprehensive metabolic panel; Future - Lipid panel; Future - TSH; Future - Microalbumin/Creatinine Ratio, Urine; Future - Hemoglobin A1c; Future - tirzepatide (MOUNJARO) 15 MG/0.5ML Pen; Inject 15 mg into the skin once a week.  Dispense: 6 mL; Refill: 0  3. Diabetes mellitus treated with insulin and oral medication (HCC) - Continue with Glipzide - CBC with Differential/Platelet; Future - Comprehensive metabolic panel; Future - Lipid panel; Future - TSH; Future - Microalbumin/Creatinine Ratio, Urine; Future - Hemoglobin A1c; Future  4. Essential hypertension - Well controlled. No change in medication  - CBC with Differential/Platelet; Future - Comprehensive metabolic panel; Future - Lipid panel; Future - TSH; Future - amLODipine (NORVASC) 2.5 MG tablet; Take 1 tablet (2.5 mg total) by mouth daily.  Dispense: 90 tablet; Refill: 3 - lisinopril (ZESTRIL) 40 MG tablet; Take 1 tablet (40 mg total) by mouth daily.  Dispense: 90 tablet; Refill: 3  5. Acquired hypothyroidism -- Consider adding back Synthroid  - CBC with Differential/Platelet; Future - Comprehensive metabolic panel; Future - Lipid panel; Future - TSH; Future  6. Thyroid goiter  - CBC with Differential/Platelet; Future - Comprehensive metabolic panel; Future - Lipid panel; Future - TSH; Future - Ambulatory referral to General Surgery  Shirline Frees, NP

## 2022-12-29 LAB — HM DIABETES EYE EXAM

## 2023-01-16 ENCOUNTER — Other Ambulatory Visit (HOSPITAL_COMMUNITY): Payer: Self-pay

## 2023-01-16 ENCOUNTER — Other Ambulatory Visit: Payer: Self-pay

## 2023-02-01 ENCOUNTER — Other Ambulatory Visit: Payer: Self-pay | Admitting: Adult Health

## 2023-02-01 DIAGNOSIS — Z1231 Encounter for screening mammogram for malignant neoplasm of breast: Secondary | ICD-10-CM

## 2023-02-02 ENCOUNTER — Other Ambulatory Visit: Payer: Self-pay | Admitting: General Surgery

## 2023-02-02 DIAGNOSIS — E049 Nontoxic goiter, unspecified: Secondary | ICD-10-CM

## 2023-02-08 ENCOUNTER — Ambulatory Visit
Admission: RE | Admit: 2023-02-08 | Discharge: 2023-02-08 | Disposition: A | Discharge status: Home / Self Care | Payer: 59 | Source: Ambulatory Visit | Attending: General Surgery | Admitting: General Surgery

## 2023-02-08 DIAGNOSIS — E049 Nontoxic goiter, unspecified: Secondary | ICD-10-CM

## 2023-02-09 ENCOUNTER — Other Ambulatory Visit: Payer: Self-pay | Admitting: General Surgery

## 2023-02-09 DIAGNOSIS — E041 Nontoxic single thyroid nodule: Secondary | ICD-10-CM

## 2023-02-13 ENCOUNTER — Ambulatory Visit
Admission: RE | Admit: 2023-02-13 | Discharge: 2023-02-13 | Disposition: A | Discharge status: Home / Self Care | Payer: 59 | Source: Ambulatory Visit | Attending: General Surgery | Admitting: General Surgery

## 2023-02-13 DIAGNOSIS — E041 Nontoxic single thyroid nodule: Secondary | ICD-10-CM

## 2023-02-13 MED ORDER — IOPAMIDOL (ISOVUE-300) INJECTION 61%
75.0000 mL | Freq: Once | INTRAVENOUS | Status: AC | PRN
  Administered 2023-02-13: 75 mL via INTRAVENOUS

## 2023-02-23 ENCOUNTER — Ambulatory Visit
Admission: RE | Admit: 2023-02-23 | Discharge: 2023-02-23 | Disposition: A | Discharge status: Home / Self Care | Payer: 59 | Source: Ambulatory Visit | Attending: Adult Health | Admitting: Adult Health

## 2023-02-23 DIAGNOSIS — Z1231 Encounter for screening mammogram for malignant neoplasm of breast: Secondary | ICD-10-CM

## 2023-03-16 ENCOUNTER — Other Ambulatory Visit (HOSPITAL_COMMUNITY): Payer: Self-pay

## 2023-03-16 ENCOUNTER — Other Ambulatory Visit: Payer: Self-pay

## 2023-03-16 ENCOUNTER — Other Ambulatory Visit: Payer: Self-pay | Admitting: Adult Health

## 2023-03-16 DIAGNOSIS — Z7985 Long-term (current) use of injectable non-insulin antidiabetic drugs: Secondary | ICD-10-CM

## 2023-03-16 MED ORDER — MOUNJARO 15 MG/0.5ML ~~LOC~~ SOAJ
15.0000 mg | SUBCUTANEOUS | 0 refills | Status: DC
  Filled 2023-03-16: qty 2, 28d supply, fill #0
  Filled 2023-04-09: qty 2, 28d supply, fill #1
  Filled 2023-05-07: qty 2, 28d supply, fill #2

## 2023-03-19 ENCOUNTER — Other Ambulatory Visit: Payer: Self-pay | Admitting: Adult Health

## 2023-03-19 DIAGNOSIS — E1169 Type 2 diabetes mellitus with other specified complication: Secondary | ICD-10-CM

## 2023-03-20 ENCOUNTER — Other Ambulatory Visit (HOSPITAL_COMMUNITY): Payer: Self-pay

## 2023-03-20 MED ORDER — GLIPIZIDE ER 10 MG PO TB24
10.0000 mg | ORAL_TABLET | Freq: Two times a day (BID) | ORAL | 0 refills | Status: DC
  Filled 2023-03-20 – 2023-04-03 (×2): qty 60, 30d supply, fill #0
  Filled 2023-05-07: qty 60, 30d supply, fill #1
  Filled 2023-06-08: qty 60, 30d supply, fill #2

## 2023-03-29 ENCOUNTER — Ambulatory Visit: Payer: 59 | Admitting: Adult Health

## 2023-03-29 ENCOUNTER — Encounter: Payer: Self-pay | Admitting: Adult Health

## 2023-03-29 VITALS — BP 132/70 | HR 94 | Temp 97.9°F | Ht 65.0 in | Wt 163.4 lb

## 2023-03-29 DIAGNOSIS — Z7984 Long term (current) use of oral hypoglycemic drugs: Secondary | ICD-10-CM | POA: Diagnosis not present

## 2023-03-29 DIAGNOSIS — Z7985 Long-term (current) use of injectable non-insulin antidiabetic drugs: Secondary | ICD-10-CM | POA: Diagnosis not present

## 2023-03-29 DIAGNOSIS — I10 Essential (primary) hypertension: Secondary | ICD-10-CM

## 2023-03-29 DIAGNOSIS — E119 Type 2 diabetes mellitus without complications: Secondary | ICD-10-CM | POA: Diagnosis not present

## 2023-03-29 DIAGNOSIS — E66811 Obesity, class 1: Secondary | ICD-10-CM

## 2023-03-29 DIAGNOSIS — Z6827 Body mass index (BMI) 27.0-27.9, adult: Secondary | ICD-10-CM

## 2023-03-29 LAB — POCT GLYCOSYLATED HEMOGLOBIN (HGB A1C): Hemoglobin A1C: 5.7 % — AB (ref 4.0–5.6)

## 2023-03-29 MED ORDER — ACCU-CHEK GUIDE TEST VI STRP: ORAL_STRIP | 12 refills | Status: AC

## 2023-03-29 MED ORDER — ACCU-CHEK SOFTCLIX LANCETS MISC: 12 refills | Status: AC

## 2023-03-29 NOTE — Progress Notes (Signed)
Subjective:    Patient ID: Autumn Acevedo, female    DOB: 11/30/1960, 62 y.o.   MRN: [id number]  HPI  62 year old female who  has a past medical history of Allergy, Diabetes mellitus without complication (HCC), Goiter, Hypertension, Hypothyroid, and ITP (idiopathic thrombocytopenic purpura).  Diabetes mellitus type II-currently managed with glipizide 10 mg twice daily and Mounjaro 15 mg weekly.  She does monitor her blood sugars at home with readings in the 100's. She denies hypoglycemic events.   She is working out more and trying to eat healthier.  Since starting [facility] her portion sizes have decreased. .  Overall she is feeling well.  Lab Results  Component Value Date   HGBA1C 8.1 (H) 12/27/2022   HGBA1C 6.7 (A) 07/11/2022   HGBA1C 6.1 (A) 04/11/2022   Wt Readings from Last 3 Encounters:  03/29/23 163 lb 6.4 oz (74.1 kg)  12/27/22 173 lb (78.5 kg)  12/08/22 174 lb 3.2 oz (79 kg)   Hypertension -managed with Norvasc 2.5 mg daily and lisinopril 40 mg daily.  She denies dizziness, lightheadedness, chest pain, or shortness of breath BP Readings from Last 3 Encounters:  03/29/23 132/70  12/27/22 130/88  12/08/22 (!) 132/96     Review of Systems See HPI   Past Medical History:  Diagnosis Date   Allergy    Diabetes mellitus without complication (HCC)    Type2    Goiter    Hypertension    Hypothyroid    ITP (idiopathic thrombocytopenic purpura)     Social History   Socioeconomic History   Marital status: Single    Spouse name: Not on file   Number of children: Not on file   Years of education: Not on file   Highest education level: Associate degree: occupational, Scientist, product/process development, or vocational program  Occupational History   Not on file  Tobacco Use   Smoking status: Never   Smokeless tobacco: Never   Tobacco comments:    never used tobacco  Vaping Use   Vaping status: Never Used  Substance and Sexual Activity   Alcohol use: Yes    Alcohol/week: 1.0 standard  drink of alcohol    Types: 1 Standard drinks or equivalent per week    Comment: 2-3 drinks a week    Drug use: No   Sexual activity: Not on file  Other Topics Concern   Not on file  Social History Narrative   Works for an Scientist, forensic    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 Has two grand babies   She enjoys reading and going to the gym. Hanging out with friends.       Social Determinants of Health   Financial Resource Strain: Low Risk  (07/18/2021)   Overall Financial Resource Strain (CARDIA)    Difficulty of Paying Living Expenses: Not hard at all  Food Insecurity: No Food Insecurity (07/18/2021)   Hunger Vital Sign    Worried About Running Out of Food in the Last Year: Never true    Ran Out of Food in the Last Year: Never true  Transportation Needs: No Transportation Needs (07/18/2021)   PRAPARE - Administrator, Civil Service (Medical): No    Lack of Transportation (Non-Medical): No  Physical Activity: Insufficiently Active (07/18/2021)   Exercise Vital Sign    Days of Exercise per Week: 3 days    Minutes of Exercise per Session: 40 min  Stress:  No Stress Concern Present (07/18/2021)   [employer] of Occupational Health - Occupational Stress Questionnaire    Feeling of Stress : Not at all  Social Connections: Moderately Integrated (07/18/2021)   Social Connection and Isolation Panel [NHANES]    Frequency of Communication with Friends and Family: More than three times a week    Frequency of Social Gatherings with Friends and Family: More than three times a week    Attends Religious Services: More than 4 times per year    Active Member of [employer] or Organizations: Yes    Attends Engineer, structural: More than 4 times per year    Marital Status: Never married  Catering manager Violence: Not on file    Past Surgical History:  Procedure Laterality Date   no surgeries at this time      Family History  Problem  Relation Age of Onset   Hypertension Mother        fhx   COPD Father    Lung cancer Father    Pancreatic cancer Brother    Colon cancer Sister     No Known Allergies  Current Outpatient Medications on File Prior to Visit  Medication Sig Dispense Refill   albuterol (VENTOLIN HFA) 108 (90 Base) MCG/ACT inhaler Inhale 2 puffs into the lungs every 6 (six) hours as needed for wheezing. 18 each 2   amLODipine (NORVASC) 2.5 MG tablet Take 1 tablet (2.5 mg total) by mouth daily. 90 tablet 3   BLACK COHOSH PO Take by mouth.     blood glucose meter kit and supplies KIT Dispense based on patient and insurance preference. Use up to four times daily as directed. 1 each 0   glipiZIDE (GLUCOTROL XL) 10 MG 24 hr tablet Take 1 tablet (10 mg total) by mouth in the morning and at bedtime. 180 tablet 0   glucose blood (ACCU-CHEK GUIDE) test strip USE UP TO 4 TIMES DAILY 100 strip 0   Lancets (ACCU-CHEK MULTICLIX) lancets USE TO TEST BLOOD GLUCOSE TWICE DAILY 200 each 3   lisinopril (ZESTRIL) 40 MG tablet Take 1 tablet (40 mg total) by mouth daily. 90 tablet 3   Multiple Vitamin (MULTIVITAMIN WITH MINERALS) TABS Take 1 tablet by mouth daily.     omeprazole (PRILOSEC) 20 MG capsule Take 1 capsule (20 mg total) by mouth daily. 90 capsule 1   tirzepatide (MOUNJARO) 15 MG/0.5ML Pen Inject 15 mg into the skin once a week. 6 mL 0   No current facility-administered medications on file prior to visit.    BP 132/70 (BP Location: Left Arm, Patient Position: Sitting, Cuff Size: Normal)   Pulse 94   Temp 97.9 F (36.6 C) (Oral)   Ht 5\' 5"  (1.651 m)   Wt 163 lb 6.4 oz (74.1 kg)   LMP 05/17/2016   SpO2 96%   BMI 27.19 kg/m       Objective:   Physical Exam Vitals and nursing note reviewed.  Constitutional:      Appearance: Normal appearance. She is obese.  Cardiovascular:     Rate and Rhythm: Normal rate and regular rhythm.     Pulses: Normal pulses.     Heart sounds: Normal heart sounds.  Pulmonary:      Effort: Pulmonary effort is normal.     Breath sounds: Normal breath sounds.  Skin:    General: Skin is warm and dry.  Neurological:     General: No focal deficit present.     Mental Status:  She is alert and oriented to person, place, and time.  Psychiatric:        Mood and Affect: Mood normal.        Behavior: Behavior normal.        Thought Content: Thought content normal.        Judgment: Judgment normal.       Assessment & Plan:  1. Diabetes mellitus treated with oral medication (HCC)  - POC HgB A1c- 5.7  - Continue with Glipizide 10 mg BID  - Continue to exercise and eat healthy  - Follow up in 3 months   2. Long-term current use of injectable noninsulin antidiabetic medication - Continue with Mounjaro 15 mg weekly  3. Essential hypertension - Controlled. No change in medication   4. Class 1 obesity - She has lost 10 pounds over the last 3 months   5. BMI 27.0-27.9,adult - Continue to exercise and eat healthy  - Follow up in one year or sooner if needed  Shirline Frees, NP

## 2023-03-29 NOTE — Addendum Note (Signed)
Addended by: Christy Sartorius on: 03/29/2023 07:33 AM   Modules accepted: Orders

## 2023-04-02 ENCOUNTER — Other Ambulatory Visit (HOSPITAL_COMMUNITY): Payer: Self-pay

## 2023-04-03 ENCOUNTER — Other Ambulatory Visit: Payer: Self-pay

## 2023-04-03 ENCOUNTER — Other Ambulatory Visit (HOSPITAL_COMMUNITY): Payer: Self-pay

## 2023-04-03 ENCOUNTER — Ambulatory Visit (HOSPITAL_COMMUNITY): Admit: 2023-04-03 | Payer: 59 | Admitting: General Surgery

## 2023-04-03 SURGERY — THYROIDECTOMY
Anesthesia: General | Laterality: Right

## 2023-04-06 ENCOUNTER — Ambulatory Visit: Admit: 2023-04-06 | Payer: 59 | Admitting: General Surgery

## 2023-04-06 SURGERY — THYROIDECTOMY
Anesthesia: General | Laterality: Right

## 2023-04-09 ENCOUNTER — Other Ambulatory Visit (HOSPITAL_COMMUNITY): Payer: Self-pay

## 2023-04-09 ENCOUNTER — Other Ambulatory Visit: Payer: Self-pay

## 2023-04-11 ENCOUNTER — Encounter: Payer: Self-pay | Admitting: Adult Health

## 2023-04-11 NOTE — Telephone Encounter (Signed)
 Care team updated and letter sent for eye exam notes.

## 2023-04-12 ENCOUNTER — Encounter: Payer: Self-pay | Admitting: Adult Health

## 2023-04-13 ENCOUNTER — Other Ambulatory Visit (HOSPITAL_COMMUNITY): Payer: Self-pay

## 2023-06-08 ENCOUNTER — Other Ambulatory Visit (HOSPITAL_COMMUNITY): Payer: Self-pay

## 2023-06-08 ENCOUNTER — Other Ambulatory Visit: Payer: Self-pay

## 2023-06-08 ENCOUNTER — Other Ambulatory Visit: Payer: Self-pay | Admitting: Adult Health

## 2023-06-08 DIAGNOSIS — Z7985 Long-term (current) use of injectable non-insulin antidiabetic drugs: Secondary | ICD-10-CM

## 2023-06-08 MED ORDER — MOUNJARO 15 MG/0.5ML ~~LOC~~ SOAJ
15.0000 mg | SUBCUTANEOUS | 0 refills | Status: DC
  Filled 2023-06-08: qty 2, 28d supply, fill #0

## 2023-06-27 ENCOUNTER — Ambulatory Visit (INDEPENDENT_AMBULATORY_CARE_PROVIDER_SITE_OTHER): Payer: 59 | Admitting: Adult Health

## 2023-06-27 ENCOUNTER — Encounter: Payer: Self-pay | Admitting: Adult Health

## 2023-06-27 ENCOUNTER — Other Ambulatory Visit (HOSPITAL_COMMUNITY): Payer: Self-pay

## 2023-06-27 VITALS — BP 120/80 | HR 90 | Temp 98.3°F | Ht 65.0 in | Wt 159.0 lb

## 2023-06-27 DIAGNOSIS — L80 Vitiligo: Secondary | ICD-10-CM

## 2023-06-27 DIAGNOSIS — Z7984 Long term (current) use of oral hypoglycemic drugs: Secondary | ICD-10-CM

## 2023-06-27 DIAGNOSIS — E119 Type 2 diabetes mellitus without complications: Secondary | ICD-10-CM | POA: Diagnosis not present

## 2023-06-27 DIAGNOSIS — Z7985 Long-term (current) use of injectable non-insulin antidiabetic drugs: Secondary | ICD-10-CM

## 2023-06-27 DIAGNOSIS — I10 Essential (primary) hypertension: Secondary | ICD-10-CM | POA: Diagnosis not present

## 2023-06-27 DIAGNOSIS — J0100 Acute maxillary sinusitis, unspecified: Secondary | ICD-10-CM

## 2023-06-27 LAB — POCT GLYCOSYLATED HEMOGLOBIN (HGB A1C): Hemoglobin A1C: 5.6 % (ref 4.0–5.6)

## 2023-06-27 MED ORDER — DOXYCYCLINE HYCLATE 100 MG PO CAPS
100.0000 mg | ORAL_CAPSULE | Freq: Two times a day (BID) | ORAL | 0 refills | Status: DC
  Filled 2023-06-27: qty 14, 7d supply, fill #0

## 2023-06-27 NOTE — Patient Instructions (Signed)
 Health Maintenance Due  Topic Date Due   Zoster Vaccines- Shingrix (1 of 2) Never done   Cervical Cancer Screening (HPV/Pap Cotest)  09/10/2017   Pneumococcal Vaccine 62-63 Years old (2 of 2 - PCV) 09/28/2017   COVID-19 Vaccine (5 - 2024-25 season) 12/24/2022   Colonoscopy  10/17/2023       12/08/2022    9:59 AM 01/10/2022    7:55 AM 10/27/2021    8:10 AM  Depression screen PHQ 2/9  Decreased Interest 0 0 0  Down, Depressed, Hopeless 0 0 0  PHQ - 2 Score 0 0 0  Altered sleeping 0 0 0  Tired, decreased energy 0 0 0  Change in appetite 0 0 0  Feeling bad or failure about yourself  0 0 0  Trouble concentrating 0 0 0  Moving slowly or fidgety/restless 0 0 0  Suicidal thoughts 0 0 0  PHQ-9 Score 0 0 0  Difficult doing work/chores  Not difficult at all Not difficult at all

## 2023-06-27 NOTE — Progress Notes (Signed)
 Subjective:    Patient ID: Autumn Acevedo, female    DOB: 06/23/1960, 63 y.o.   MRN: [id number]  HPI  63 year old female who  has a past medical history of Allergy, Diabetes mellitus without complication (HCC), Goiter, Hypertension, Hypothyroid, and ITP (idiopathic thrombocytopenic purpura).  She is being evaluated today for three month follow up regarding DM and HTN   Diabetes mellitus type II-currently managed with glipizide 10 mg twice daily and Mounjaro 15 mg weekly.  She does monitor her blood sugars at home with readings in the 100's. She denies hypoglycemic events. She has not been working out but is eating healthy.   Since starting [facility] her portion sizes have decreased. .  Overall she is feeling well.  Lab Results  Component Value Date   HGBA1C 5.6 06/27/2023   HGBA1C 5.7 (A) 03/29/2023   HGBA1C 8.1 (H) 12/27/2022   Wt Readings from Last 3 Encounters:  06/27/23 159 lb (72.1 kg)  03/29/23 163 lb 6.4 oz (74.1 kg)  12/27/22 173 lb (78.5 kg)   Hypertension -managed with Norvasc 2.5 mg daily and lisinopril 40 mg daily.  She denies dizziness, lightheadedness, chest pain, or shortness of breath BP Readings from Last 3 Encounters:  06/27/23 120/80  03/29/23 132/70  12/27/22 130/88   Additionally, over the last 2-3 weeks she has been experiening sinus congestion, pressure under her eyes, nasal drainage and a mild cough. She has not had any fevers or chills.   She also feels like her Vitiligo is getting worse. She would like to be referred to dermatology   Review of Systems See HPI   Past Medical History:  Diagnosis Date   Allergy    Diabetes mellitus without complication (HCC)    Type2    Goiter    Hypertension    Hypothyroid    ITP (idiopathic thrombocytopenic purpura)     Social History   Socioeconomic History   Marital status: Single    Spouse name: Not on file   Number of children: Not on file   Years of education: Not on file   Highest education  level: Associate degree: occupational, Scientist, product/process development, or vocational program  Occupational History   Not on file  Tobacco Use   Smoking status: Never   Smokeless tobacco: Never   Tobacco comments:    never used tobacco  Vaping Use   Vaping status: Never Used  Substance and Sexual Activity   Alcohol use: Yes    Alcohol/week: 1.0 standard drink of alcohol    Types: 1 Standard drinks or equivalent per week    Comment: 2-3 drinks a week    Drug use: No   Sexual activity: Not on file  Other Topics Concern   Not on file  Social History Narrative   Works for an Scientist, forensic    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 Has two grand babies   She enjoys reading and going to the gym. Hanging out with friends.       Social Drivers of Corporate investment banker Strain: Low Risk  (07/18/2021)   Overall Financial Resource Strain (CARDIA)    Difficulty of Paying Living Expenses: Not hard at all  Food Insecurity: No Food Insecurity (07/18/2021)   Hunger Vital Sign    Worried About Running Out of Food in the Last Year: Never true    Ran Out of Food in the Last Year: Never true  Transportation Needs: No Transportation Needs (07/18/2021)   PRAPARE - Administrator, Civil Service (Medical): No    Lack of Transportation (Non-Medical): No  Physical Activity: Insufficiently Active (07/18/2021)   Exercise Vital Sign    Days of Exercise per Week: 3 days    Minutes of Exercise per Session: 40 min  Stress: No Stress Concern Present (07/18/2021)   [employer] of Occupational Health - Occupational Stress Questionnaire    Feeling of Stress : Not at all  Social Connections: Moderately Integrated (07/18/2021)   Social Connection and Isolation Panel [NHANES]    Frequency of Communication with Friends and Family: More than three times a week    Frequency of Social Gatherings with Friends and Family: More than three times a week    Attends Religious Services: More  than 4 times per year    Active Member of [employer] or Organizations: Yes    Attends Engineer, structural: More than 4 times per year    Marital Status: Never married  Catering manager Violence: Not on file    Past Surgical History:  Procedure Laterality Date   no surgeries at this time      Family History  Problem Relation Age of Onset   Hypertension Mother        fhx   COPD Father    Lung cancer Father    Pancreatic cancer Brother    Colon cancer Sister     No Known Allergies  Current Outpatient Medications on File Prior to Visit  Medication Sig Dispense Refill   Accu-Chek Softclix Lancets lancets Use as instructed to check glucose daily 100 each 12   albuterol (VENTOLIN HFA) 108 (90 Base) MCG/ACT inhaler Inhale 2 puffs into the lungs every 6 (six) hours as needed for wheezing. 18 each 2   amLODipine (NORVASC) 2.5 MG tablet Take 1 tablet (2.5 mg total) by mouth daily. 90 tablet 3   BLACK COHOSH PO Take by mouth.     blood glucose meter kit and supplies KIT Dispense based on patient and insurance preference. Use up to four times daily as directed. 1 each 0   glipiZIDE (GLUCOTROL XL) 10 MG 24 hr tablet Take 1 tablet (10 mg total) by mouth in the morning and at bedtime. 180 tablet 0   glucose blood (ACCU-CHEK GUIDE TEST) test strip Use as instructed to check glucose daily 100 each 12   lisinopril (ZESTRIL) 40 MG tablet Take 1 tablet (40 mg total) by mouth daily. 90 tablet 3   Multiple Vitamin (MULTIVITAMIN WITH MINERALS) TABS Take 1 tablet by mouth daily.     omeprazole (PRILOSEC) 20 MG capsule Take 1 capsule (20 mg total) by mouth daily. 90 capsule 1   tirzepatide (MOUNJARO) 15 MG/0.5ML Pen Inject 15 mg into the skin once a week. 2 mL 0   No current facility-administered medications on file prior to visit.    BP 120/80   Pulse 90   Temp 98.3 F (36.8 C) (Oral)   Ht 5\' 5"  (1.651 m)   Wt 159 lb (72.1 kg)   LMP 05/17/2016   SpO2 100%   BMI 26.46 kg/m        Objective:   Physical Exam Vitals and nursing note reviewed.  Constitutional:      Appearance: Normal appearance. She is obese.  HENT:     Nose: Congestion present. No rhinorrhea.     Right Turbinates: Enlarged and swollen.     Left Turbinates: Enlarged and  swollen.     Right Sinus: Maxillary sinus tenderness present.     Left Sinus: Maxillary sinus tenderness present.  Cardiovascular:     Rate and Rhythm: Normal rate and regular rhythm.     Pulses: Normal pulses.     Heart sounds: Normal heart sounds.  Pulmonary:     Effort: Pulmonary effort is normal.     Breath sounds: Normal breath sounds.  Skin:    General: Skin is warm and dry.     Comments: Patches of hypopigmentation noted throughout her face and scalp   Neurological:     General: No focal deficit present.     Mental Status: She is alert and oriented to person, place, and time.  Psychiatric:        Mood and Affect: Mood normal.        Behavior: Behavior normal.        Thought Content: Thought content normal.        Judgment: Judgment normal.        Assessment & Plan:  1. Diabetes mellitus treated with oral medication (HCC) (Primary)  - POC HgB A1c- 5.6  - Doing well with her current regimen but she does need to start exercising again - Follow up in 3 months   2. Long-term current use of injectable noninsulin antidiabetic medication - Continue with Ounjaro   3. Essential hypertension - Well controlled.  - No change in medication   4. Acute non-recurrent maxillary sinusitis  - doxycycline (VIBRAMYCIN) 100 MG capsule; Take 1 capsule (100 mg total) by mouth 2 (two) times daily.  Dispense: 14 capsule; Refill: 0  5. Vitiligo  - Ambulatory referral to Dermatology   Shirline Frees, NP

## 2023-07-03 DIAGNOSIS — L80 Vitiligo: Secondary | ICD-10-CM

## 2023-07-05 ENCOUNTER — Other Ambulatory Visit: Payer: Self-pay | Admitting: Adult Health

## 2023-07-05 ENCOUNTER — Other Ambulatory Visit (HOSPITAL_COMMUNITY): Payer: Self-pay

## 2023-07-05 ENCOUNTER — Other Ambulatory Visit: Payer: Self-pay

## 2023-07-05 DIAGNOSIS — E1169 Type 2 diabetes mellitus with other specified complication: Secondary | ICD-10-CM

## 2023-07-05 DIAGNOSIS — Z7985 Long-term (current) use of injectable non-insulin antidiabetic drugs: Secondary | ICD-10-CM

## 2023-07-05 MED ORDER — GLIPIZIDE ER 10 MG PO TB24
10.0000 mg | ORAL_TABLET | Freq: Two times a day (BID) | ORAL | 0 refills | Status: DC
  Filled 2023-07-05: qty 60, 30d supply, fill #0
  Filled 2023-08-08 – 2023-08-21 (×2): qty 60, 30d supply, fill #1
  Filled 2023-09-14: qty 60, 30d supply, fill #2

## 2023-07-05 MED ORDER — MOUNJARO 15 MG/0.5ML ~~LOC~~ SOAJ
15.0000 mg | SUBCUTANEOUS | 0 refills | Status: DC
  Filled 2023-07-05: qty 2, 28d supply, fill #0

## 2023-07-05 NOTE — Telephone Encounter (Signed)
 Received notification from [facility] that they are not accepting patients for referrals concerning for skin cancer.   Will refer to [facility] for Vitiligo

## 2023-07-13 ENCOUNTER — Other Ambulatory Visit (HOSPITAL_COMMUNITY): Payer: Self-pay

## 2023-07-30 ENCOUNTER — Other Ambulatory Visit (HOSPITAL_COMMUNITY): Payer: Self-pay

## 2023-07-30 MED ORDER — OXYCODONE HCL 5 MG PO TABS
5.0000 mg | ORAL_TABLET | ORAL | 0 refills | Status: DC | PRN
  Filled 2023-07-30: qty 15, 3d supply, fill #0

## 2023-07-31 ENCOUNTER — Other Ambulatory Visit (HOSPITAL_COMMUNITY): Payer: Self-pay

## 2023-08-09 ENCOUNTER — Other Ambulatory Visit: Payer: Self-pay | Admitting: Adult Health

## 2023-08-09 DIAGNOSIS — Z7985 Long-term (current) use of injectable non-insulin antidiabetic drugs: Secondary | ICD-10-CM

## 2023-08-15 ENCOUNTER — Other Ambulatory Visit (HOSPITAL_COMMUNITY): Payer: Self-pay

## 2023-08-15 MED ORDER — MOUNJARO 15 MG/0.5ML ~~LOC~~ SOAJ
15.0000 mg | SUBCUTANEOUS | 0 refills | Status: DC
  Filled 2023-08-15: qty 2, 28d supply, fill #0

## 2023-08-20 ENCOUNTER — Other Ambulatory Visit (HOSPITAL_COMMUNITY): Payer: Self-pay

## 2023-08-22 ENCOUNTER — Other Ambulatory Visit (HOSPITAL_COMMUNITY): Payer: Self-pay

## 2023-09-14 ENCOUNTER — Other Ambulatory Visit: Payer: Self-pay | Admitting: Adult Health

## 2023-09-14 ENCOUNTER — Other Ambulatory Visit (HOSPITAL_COMMUNITY): Payer: Self-pay

## 2023-09-14 DIAGNOSIS — Z7985 Long-term (current) use of injectable non-insulin antidiabetic drugs: Secondary | ICD-10-CM

## 2023-09-14 MED ORDER — MOUNJARO 15 MG/0.5ML ~~LOC~~ SOAJ
15.0000 mg | SUBCUTANEOUS | 0 refills | Status: DC
  Filled 2023-09-14: qty 2, 28d supply, fill #0

## 2023-09-18 ENCOUNTER — Other Ambulatory Visit (HOSPITAL_COMMUNITY): Payer: Self-pay

## 2023-09-27 ENCOUNTER — Other Ambulatory Visit (HOSPITAL_COMMUNITY): Payer: Self-pay

## 2023-09-27 ENCOUNTER — Ambulatory Visit (INDEPENDENT_AMBULATORY_CARE_PROVIDER_SITE_OTHER): Admitting: Adult Health

## 2023-09-27 ENCOUNTER — Encounter: Payer: Self-pay | Admitting: Adult Health

## 2023-09-27 VITALS — BP 110/80 | HR 78 | Temp 98.0°F | Ht 65.0 in | Wt 159.0 lb

## 2023-09-27 DIAGNOSIS — Z7984 Long term (current) use of oral hypoglycemic drugs: Secondary | ICD-10-CM

## 2023-09-27 DIAGNOSIS — I10 Essential (primary) hypertension: Secondary | ICD-10-CM

## 2023-09-27 DIAGNOSIS — E119 Type 2 diabetes mellitus without complications: Secondary | ICD-10-CM

## 2023-09-27 DIAGNOSIS — Z7985 Long-term (current) use of injectable non-insulin antidiabetic drugs: Secondary | ICD-10-CM | POA: Diagnosis not present

## 2023-09-27 LAB — POCT GLYCOSYLATED HEMOGLOBIN (HGB A1C): Hemoglobin A1C: 5.7 % — AB (ref 4.0–5.6)

## 2023-09-27 MED ORDER — GLIPIZIDE ER 10 MG PO TB24
10.0000 mg | ORAL_TABLET | Freq: Every day | ORAL | 0 refills | Status: DC
  Filled 2023-09-27 – 2023-10-03 (×5): qty 90, 90d supply, fill #0
  Filled 2023-10-04 – 2023-10-08 (×2): qty 30, 30d supply, fill #0

## 2023-09-27 MED ORDER — MOUNJARO 15 MG/0.5ML ~~LOC~~ SOAJ
15.0000 mg | SUBCUTANEOUS | 1 refills | Status: DC
  Filled 2023-09-27: qty 6, 84d supply, fill #0
  Filled 2023-10-10: qty 2, 28d supply, fill #0
  Filled 2023-11-07: qty 2, 28d supply, fill #1
  Filled 2023-12-05 – 2023-12-17 (×2): qty 2, 28d supply, fill #2
  Filled 2024-01-07: qty 2, 28d supply, fill #3
  Filled 2024-02-08: qty 2, 28d supply, fill #4
  Filled 2024-03-07: qty 2, 28d supply, fill #5

## 2023-09-27 NOTE — Patient Instructions (Addendum)
 Your A1c was 5.7. I am going to decrease your glipizide  dose to daily   Follow up for your physical exam after September 4th   [facility]  Address: [facility], [address], [place], [place] [address] Phone: [phone]  [facility]  Address: [address] [place] [address], [place], [place] [address] Phone: [phone]

## 2023-09-27 NOTE — Progress Notes (Signed)
 Subjective:    Patient ID: Autumn Acevedo, female    DOB: 1960-07-10, 63 y.o.   MRN: [id number]  HPI  63 year old female who  has a past medical history of Allergy, Diabetes mellitus without complication (HCC), Goiter, Hypertension, Hypothyroid, and ITP (idiopathic thrombocytopenic purpura).   She is being evaluated today for three month follow up regarding DM and HTN   Diabetes mellitus type II-currently managed with glipizide  10 mg twice daily and Mounjaro  15 mg weekly.  She does monitor her blood sugars at home with readings in the 100's. She denies hypoglycemic events. She has been walking more frequently and eating healthy.  Since starting Mounjaro  her portion sizes have decreased. .  Overall she is feeling well.  Lab Results  Component Value Date   HGBA1C 5.7 (A) 09/27/2023   HGBA1C 5.6 06/27/2023   HGBA1C 5.7 (A) 03/29/2023   Wt Readings from Last 3 Encounters:  09/27/23 159 lb (72.1 kg)  06/27/23 159 lb (72.1 kg)  03/29/23 163 lb 6.4 oz (74.1 kg)   Hypertension -managed with Norvasc  2.5 mg daily and lisinopril  40 mg daily.  She denies dizziness, lightheadedness, chest pain, or shortness of breath BP Readings from Last 3 Encounters:  09/27/23 110/80  06/27/23 120/80  03/29/23 132/70   In the interm she had a right sided thyroid  lobectomy done about two months ago. She has heeled well.  Her last TSH two weeks ago was 2.25 and Te,Free 1.09. We will recheck at her CPE in 3 months    Review of Systems See HPI   Past Medical History:  Diagnosis Date   Allergy    Diabetes mellitus without complication (HCC)    Type2    Goiter    Hypertension    Hypothyroid    ITP (idiopathic thrombocytopenic purpura)     Social History   Socioeconomic History   Marital status: Single    Spouse name: Not on file   Number of children: Not on file   Years of education: Not on file   Highest education level: Associate degree: occupational, Scientist, product/process development, or vocational program   Occupational History   Not on file  Tobacco Use   Smoking status: Never   Smokeless tobacco: Never   Tobacco comments:    never used tobacco  Vaping Use   Vaping status: Never Used  Substance and Sexual Activity   Alcohol use: Yes    Alcohol/week: 1.0 standard drink of alcohol    Types: 1 Standard drinks or equivalent per week    Comment: 2-3 drinks a week    Drug use: No   Sexual activity: Not on file  Other Topics Concern   Not on file  Social History Narrative   Works for an Scientist, forensic    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 Has two grand babies   She enjoys reading and going to the gym. Hanging out with friends.       Social Drivers of Corporate investment banker Strain: Low Risk  (07/30/2023)   Received from [facility]   Overall Financial Resource Strain (CARDIA)    Difficulty of Paying Living Expenses: Not very hard  Food Insecurity: No Food Insecurity (07/30/2023)   Received from [facility]   Hunger Vital Sign    Worried About Running Out of Food in the Last Year: Never true    Ran Out of Food in  the Last Year: Never true  Transportation Needs: Unknown (07/30/2023)   Received from [facility] - Transportation    In the past 12 months, has lack of transportation kept you from medical appointments or from getting medications?: No    Lack of Transportation (Non-Medical): Not on file  Physical Activity: Insufficiently Active (07/18/2021)   Exercise Vital Sign    Days of Exercise per Week: 3 days    Minutes of Exercise per Session: 40 min  Stress: No Stress Concern Present (07/18/2021)   [employer] of Occupational Health - Occupational Stress Questionnaire    Feeling of Stress : Not at all  Social Connections: Moderately Integrated (07/18/2021)   Social Connection and Isolation Panel [NHANES]    Frequency of Communication with Friends and Family: More than three  times a week    Frequency of Social Gatherings with Friends and Family: More than three times a week    Attends Religious Services: More than 4 times per year    Active Member of [employer] or Organizations: Yes    Attends Engineer, structural: More than 4 times per year    Marital Status: Never married  Catering manager Violence: Not on file    Past Surgical History:  Procedure Laterality Date   no surgeries at this time      Family History  Problem Relation Age of Onset   Hypertension Mother        fhx   COPD Father    Lung cancer Father    Pancreatic cancer Brother    Colon cancer Sister     No Known Allergies  Current Outpatient Medications on File Prior to Visit  Medication Sig Dispense Refill   Accu-Chek Softclix Lancets lancets Use as instructed to check glucose daily 100 each 12   albuterol  (VENTOLIN  HFA) 108 (90 Base) MCG/ACT inhaler Inhale 2 puffs into the lungs every 6 (six) hours as needed for wheezing. 18 each 2   amLODipine  (NORVASC ) 2.5 MG tablet Take 1 tablet (2.5 mg total) by mouth daily. 90 tablet 3   BLACK COHOSH PO Take by mouth.     blood glucose meter kit and supplies KIT Dispense based on patient and insurance preference. Use up to four times daily as directed. 1 each 0   doxycycline  (VIBRAMYCIN ) 100 MG capsule Take 1 capsule (100 mg total) by mouth 2 (two) times daily. 14 capsule 0   glipiZIDE  (GLUCOTROL  XL) 10 MG 24 hr tablet Take 1 tablet (10 mg total) by mouth in the morning and at bedtime. 180 tablet 0   glucose blood (ACCU-CHEK GUIDE TEST) test strip Use as instructed to check glucose daily 100 each 12   lisinopril  (ZESTRIL ) 40 MG tablet Take 1 tablet (40 mg total) by mouth daily. 90 tablet 3   Multiple Vitamin (MULTIVITAMIN WITH MINERALS) TABS Take 1 tablet by mouth daily.     omeprazole  (PRILOSEC) 20 MG capsule Take 1 capsule (20 mg total) by mouth daily. 90 capsule 1   oxyCODONE  (OXY IR/ROXICODONE ) 5 MG immediate release tablet Take 1 tablet  (5 mg total) by mouth every 4 (four) hours as needed for Pain for up to 5 days 15 tablet 0   tirzepatide  (MOUNJARO ) 15 MG/0.5ML Pen Inject 15 mg into the skin once a week. 2 mL 0   No current facility-administered medications on file prior to visit.    BP 110/80   Pulse 78   Temp 98 F (36.7 C) (Oral)  Ht 5\' 5"  (1.651 m)   Wt 159 lb (72.1 kg)   LMP 05/17/2016   SpO2 93%   BMI 26.46 kg/m       Objective:   Physical Exam Vitals and nursing note reviewed.  Constitutional:      Appearance: Normal appearance. She is obese.  Cardiovascular:     Rate and Rhythm: Normal rate and regular rhythm.     Pulses: Normal pulses.     Heart sounds: Normal heart sounds.  Pulmonary:     Effort: Pulmonary effort is normal.     Breath sounds: Normal breath sounds.  Skin:    General: Skin is warm and dry.  Neurological:     General: No focal deficit present.     Mental Status: She is alert and oriented to person, place, and time.  Psychiatric:        Mood and Affect: Mood normal.        Behavior: Behavior normal.        Thought Content: Thought content normal.        Judgment: Judgment normal.       Assessment & Plan:  1. Diabetes mellitus treated with oral medication (HCC) (Primary)  - POC HgB A1c- 5.7. I am going to have her cut back on Glipizide  to once daily dosing.  - Follow up in 3 months for CPE  - glipiZIDE  (GLUCOTROL  XL) 10 MG 24 hr tablet; Take 1 tablet (10 mg total) by mouth daily with breakfast.  Dispense: 90 tablet; Refill: 0  2. Long-term current use of injectable noninsulin antidiabetic medication - Continue to exercise and eat healthy  - POC HgB A1c - tirzepatide  (MOUNJARO ) 15 MG/0.5ML Pen; Inject 15 mg into the skin once a week.  Dispense: 6 mL; Refill: 1  3. Essential hypertension - Well controlled. No change in medication   Alto Atta, NP

## 2023-09-28 ENCOUNTER — Other Ambulatory Visit (HOSPITAL_COMMUNITY): Payer: Self-pay

## 2023-10-01 ENCOUNTER — Other Ambulatory Visit (HOSPITAL_COMMUNITY): Payer: Self-pay

## 2023-10-02 ENCOUNTER — Other Ambulatory Visit (HOSPITAL_COMMUNITY): Payer: Self-pay

## 2023-10-03 ENCOUNTER — Other Ambulatory Visit (HOSPITAL_COMMUNITY): Payer: Self-pay

## 2023-10-04 ENCOUNTER — Other Ambulatory Visit (HOSPITAL_COMMUNITY): Payer: Self-pay

## 2023-10-05 ENCOUNTER — Encounter: Payer: Self-pay | Admitting: Pediatrics

## 2023-10-08 ENCOUNTER — Other Ambulatory Visit (HOSPITAL_COMMUNITY): Payer: Self-pay

## 2023-10-10 ENCOUNTER — Other Ambulatory Visit (HOSPITAL_COMMUNITY): Payer: Self-pay

## 2023-11-06 ENCOUNTER — Other Ambulatory Visit (HOSPITAL_COMMUNITY): Payer: Self-pay

## 2023-11-06 ENCOUNTER — Ambulatory Visit (AMBULATORY_SURGERY_CENTER)

## 2023-11-06 ENCOUNTER — Encounter: Payer: Self-pay | Admitting: Pediatrics

## 2023-11-06 VITALS — Ht 65.0 in | Wt 160.0 lb

## 2023-11-06 DIAGNOSIS — Z1211 Encounter for screening for malignant neoplasm of colon: Secondary | ICD-10-CM

## 2023-11-06 MED ORDER — NA SULFATE-K SULFATE-MG SULF 17.5-3.13-1.6 GM/177ML PO SOLN
1.0000 | Freq: Once | ORAL | 0 refills | Status: AC
  Filled 2023-11-06: qty 354, 1d supply, fill #0

## 2023-11-06 NOTE — Progress Notes (Signed)

## 2023-11-06 NOTE — Patient Instructions (Signed)
  ORAL DIABETIC MEDICATION INSTRUCTIONS Glucotrol   The day before your procedure:  * Hold your diabetic pill   The day of your procedure:   * Do NOT take your diabetic pill  * We will check your blood sugar levels during the admission process and again in Recovery before discharging you home.   Stop ONCE A WEEK INJECTIONS: Trulicity , Ozempic,  Mounjaro , Wegovy, Tanzeum, Byetta, Victoza, Bydureon, Symlin Pen & Zepbound  - DO NOT TAKE 7 days prior to the procedure.  Last dose  Tuesday 07/22

## 2023-11-09 ENCOUNTER — Encounter: Payer: Self-pay | Admitting: Advanced Practice Midwife

## 2023-11-20 NOTE — Progress Notes (Unsigned)
 [facility] History and Physical   Primary Care Physician:  Merna Huxley, NP   Reason for Procedure:  Colorectal cancer screening  Plan:    Screening colonoscopy     HPI: Autumn Acevedo is a 63 y.o. female undergoing screening colonoscopy for colorectal cancer screening.  Patient underwent colonoscopy in 2015 which was normal except for mild segmental inflammation in the sigmoid colon.  Biopsies showed evidence of chronic colitis.  Differential diagnosis could include IBD versus SCAD -no diverticula noted at that time.  No GI follow-up has ensued.  Patient advised a 10-year follow-up colonoscopy for screening purposes.  Records indicate a diagnosis of colorectal cancer in the patient's sister.   Past Medical History:  Diagnosis Date   Allergy    Diabetes mellitus without complication (HCC)    Type2    Goiter    Hypertension    Hypothyroid    ITP (idiopathic thrombocytopenic purpura)     Past Surgical History:  Procedure Laterality Date   no surgeries at this time      Prior to Admission medications   Medication Sig Start Date End Date Taking? Authorizing Provider  Accu-Chek Softclix Lancets lancets Use as instructed to check glucose daily 03/29/23   Nafziger, Huxley, NP  albuterol  (VENTOLIN  HFA) 108 (90 Base) MCG/ACT inhaler Inhale 2 puffs into the lungs every 6 (six) hours as needed for wheezing. 11/14/21   Nafziger, Huxley, NP  amLODipine  (NORVASC ) 2.5 MG tablet Take 1 tablet (2.5 mg total) by mouth daily. 12/27/22   Nafziger, Huxley, NP  BLACK COHOSH PO Take by mouth. Patient not taking: Reported on 11/06/2023    [provider]  blood glucose meter kit and supplies KIT Dispense based on patient and insurance preference. Use up to four times daily as directed. 01/11/22   Nafziger, Huxley, NP  glipiZIDE  (GLUCOTROL  XL) 10 MG 24 hr tablet Take 1 tablet (10 mg total) by mouth daily with breakfast. 09/27/23   Nafziger, Huxley, NP  glucose blood (ACCU-CHEK GUIDE TEST)  test strip Use as instructed to check glucose daily 03/29/23   Nafziger, Huxley, NP  lisinopril  (ZESTRIL ) 40 MG tablet Take 1 tablet (40 mg total) by mouth daily. 12/27/22   Nafziger, Huxley, NP  Multiple Vitamin (MULTIVITAMIN WITH MINERALS) TABS Take 1 tablet by mouth daily.    [provider]  omeprazole  (PRILOSEC) 20 MG capsule Take 1 capsule (20 mg total) by mouth daily. Patient taking differently: Take 20 mg by mouth daily as needed. 05/24/21   Nafziger, Huxley, NP  tirzepatide  (MOUNJARO ) 15 MG/0.5ML Pen Inject 15 mg into the skin once a week. 09/27/23 12/26/23  Merna Huxley, NP    Current Outpatient Medications  Medication Sig Dispense Refill   Accu-Chek Softclix Lancets lancets Use as instructed to check glucose daily 100 each 12   amLODipine  (NORVASC ) 2.5 MG tablet Take 1 tablet (2.5 mg total) by mouth daily. 90 tablet 3   blood glucose meter kit and supplies KIT Dispense based on patient and insurance preference. Use up to four times daily as directed. 1 each 0   glipiZIDE  (GLUCOTROL  XL) 10 MG 24 hr tablet Take 1 tablet (10 mg total) by mouth daily with breakfast. 90 tablet 0   glucose blood (ACCU-CHEK GUIDE TEST) test strip Use as instructed to check glucose daily 100 each 12   lisinopril  (ZESTRIL ) 40 MG tablet Take 1 tablet (40 mg total) by mouth daily. 90 tablet 3   Multiple Vitamin (MULTIVITAMIN WITH MINERALS) TABS Take 1 tablet  by mouth daily.     albuterol  (VENTOLIN  HFA) 108 (90 Base) MCG/ACT inhaler Inhale 2 puffs into the lungs every 6 (six) hours as needed for wheezing. 18 each 2   BLACK COHOSH PO Take by mouth. (Patient not taking: Reported on 11/06/2023)     omeprazole  (PRILOSEC) 20 MG capsule Take 1 capsule (20 mg total) by mouth daily. (Patient taking differently: Take 20 mg by mouth daily as needed.) 90 capsule 1   tirzepatide  (MOUNJARO ) 15 MG/0.5ML Pen Inject 15 mg into the skin once a week. 6 mL 1   Current Facility-Administered Medications  Medication Dose Route Frequency  Provider Last Rate Last Admin   0.9 %  sodium chloride  infusion  500 mL Intravenous Once Lauranne Beyersdorf, Inocente HERO, MD        Allergies as of 11/21/2023   (No Known Allergies)    Family History  Problem Relation Age of Onset   Hypertension Mother        fhx   COPD Father    Lung cancer Father    Colon cancer Sister    Pancreatic cancer Brother    Stomach cancer Neg Hx    Rectal cancer Neg Hx     Social History   Socioeconomic History   Marital status: Single    Spouse name: Not on file   Number of children: Not on file   Years of education: Not on file   Highest education level: Associate degree: occupational, Scientist, product/process development, or vocational program  Occupational History   Not on file  Tobacco Use   Smoking status: Never   Smokeless tobacco: Never   Tobacco comments:    never used tobacco  Vaping Use   Vaping status: Never Used  Substance and Sexual Activity   Alcohol use: Yes    Alcohol/week: 1.0 standard drink of alcohol    Types: 1 Standard drinks or equivalent per week    Comment: 2-3 drinks a week    Drug use: No   Sexual activity: Not on file  Other Topics Concern   Not on file  Social History Narrative   Works for an Scientist, forensic    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 Has two grand babies   She enjoys reading and going to the gym. Hanging out with friends.       Social Drivers of Corporate investment banker Strain: Low Risk  (07/30/2023)   Received from [facility]   Overall Financial Resource Strain (CARDIA)    Difficulty of Paying Living Expenses: Not very hard  Food Insecurity: No Food Insecurity (07/30/2023)   Received from [facility]   Hunger Vital Sign    Within the past 12 months, you worried that your food would run out before you got the money to buy more.: Never true    Within the past 12 months, the food you bought just didn't last and you didn't have money to get more.: Never true   Transportation Needs: Unknown (07/30/2023)   Received from [facility] - Transportation    In the past 12 months, has lack of transportation kept you from medical appointments or from getting medications?: No    Lack of Transportation (Non-Medical): Not on file  Physical Activity: Insufficiently Active (07/18/2021)   Exercise Vital Sign    Days of Exercise per Week: 3 days    Minutes of Exercise per Session:  40 min  Stress: No Stress Concern Present (07/18/2021)   [employer] of Occupational Health - Occupational Stress Questionnaire    Feeling of Stress : Not at all  Social Connections: Moderately Integrated (07/18/2021)   Social Connection and Isolation Panel    Frequency of Communication with Friends and Family: More than three times a week    Frequency of Social Gatherings with Friends and Family: More than three times a week    Attends Religious Services: More than 4 times per year    Active Member of [employer] or Organizations: Yes    Attends Engineer, structural: More than 4 times per year    Marital Status: Never married  Intimate Partner Violence: Not on file    Review of Systems:  All other review of systems negative except as mentioned in the HPI.  Physical Exam: Vital signs BP (!) 183/103   Pulse 76   Temp (!) 97.3 F (36.3 C) (Skin)   Resp 13   Ht 5' 5 (1.651 m)   Wt 160 lb (72.6 kg)   LMP 05/17/2016   SpO2 100%   BMI 26.63 kg/m   General:   Alert,  Well-developed, well-nourished, pleasant and cooperative in NAD Airway:  Mallampati 2 Lungs:  Clear throughout to auscultation.   Heart:  Regular rate and rhythm; no murmurs, clicks, rubs,  or gallops. Abdomen:  Soft, nontender and nondistended. Normal bowel sounds.   Neuro/Psych:  Normal mood and affect. A and O x 3  Inocente Hausen, MD [facility]

## 2023-11-21 ENCOUNTER — Ambulatory Visit (AMBULATORY_SURGERY_CENTER): Admitting: Pediatrics

## 2023-11-21 ENCOUNTER — Encounter: Payer: Self-pay | Admitting: Pediatrics

## 2023-11-21 VITALS — BP 138/91 | HR 76 | Temp 97.3°F | Resp 16 | Ht 65.0 in | Wt 160.0 lb

## 2023-11-21 DIAGNOSIS — K648 Other hemorrhoids: Secondary | ICD-10-CM | POA: Diagnosis not present

## 2023-11-21 DIAGNOSIS — Z800 Family history of malignant neoplasm of digestive organs: Secondary | ICD-10-CM

## 2023-11-21 DIAGNOSIS — Z1211 Encounter for screening for malignant neoplasm of colon: Secondary | ICD-10-CM

## 2023-11-21 MED ORDER — SODIUM CHLORIDE 0.9 % IV SOLN: 500.0000 mL | Freq: Once | INTRAVENOUS | Status: DC

## 2023-11-21 NOTE — Progress Notes (Signed)
 0848 BP 190/112, Labetalol given IV, MD update, vss

## 2023-11-21 NOTE — Patient Instructions (Addendum)
 Resume previous diet and continue present medications  Repeat colonoscopy within the next 6-12 months for surveillance. Will require a 2-day bowel prep prior to the next colonoscopy.    YOU HAD AN ENDOSCOPIC PROCEDURE TODAY AT THE [facility]:   Refer to the procedure report that was given to you for any specific questions about what was found during the examination.  If the procedure report does not answer your questions, please call your gastroenterologist to clarify.  If you requested that your care partner not be given the details of your procedure findings, then the procedure report has been included in a sealed envelope for you to review at your convenience later.  YOU SHOULD EXPECT: Some feelings of bloating in the abdomen. Passage of more gas than usual.  Walking can help get rid of the air that was put into your GI tract during the procedure and reduce the bloating. If you had a lower endoscopy (such as a colonoscopy or flexible sigmoidoscopy) you may notice spotting of blood in your stool or on the toilet paper. If you underwent a bowel prep for your procedure, you may not have a normal bowel movement for a few days.  Please Note:  You might notice some irritation and congestion in your nose or some drainage.  This is from the oxygen used during your procedure.  There is no need for concern and it should clear up in a day or so.  SYMPTOMS TO REPORT IMMEDIATELY:  Following lower endoscopy (colonoscopy or flexible sigmoidoscopy):  Excessive amounts of blood in the stool  Significant tenderness or worsening of abdominal pains  Swelling of the abdomen that is new, acute  Fever of 100F or higher  For urgent or emergent issues, a gastroenterologist can be reached at any hour by calling [phone]. Do not use MyChart messaging for urgent concerns.    DIET:  We do recommend a small meal at first, but then you may proceed to your regular diet.  Drink plenty of fluids but you  should avoid alcoholic beverages for 24 hours.  ACTIVITY:  You should plan to take it easy for the rest of today and you should NOT DRIVE or use heavy machinery until tomorrow (because of the sedation medicines used during the test).    FOLLOW UP: Our staff will call the number listed on your records the next business day following your procedure.  We will call around 7:15- 8:00 am to check on you and address any questions or concerns that you may have regarding the information given to you following your procedure. If we do not reach you, we will leave a message.     If any biopsies were taken you will be contacted by phone or by letter within the next 1-3 weeks.  Please call us  at [phone] if you have not heard about the biopsies in 3 weeks.    SIGNATURES/CONFIDENTIALITY: You and/or your care partner have signed paperwork which will be entered into your electronic medical record.  These signatures attest to the fact that that the information above on your After Visit Summary has been reviewed and is understood.  Full responsibility of the confidentiality of this discharge information lies with you and/or your care-partner.

## 2023-11-21 NOTE — Progress Notes (Signed)
 Pt's states no medical or surgical changes since previsit or office visit.

## 2023-11-21 NOTE — Op Note (Signed)
 [facility] Patient Name: Autumn Acevedo Procedure Date: 11/21/2023 8:38 AM MRN: [id number] Endoscopist: Inocente Hausen , MD, [id number] Age: 63 Referring MD:  Date of Birth: Aug 26, 1960 Gender: Female Account #: [id number] Procedure:                Colonoscopy Indications:              Screening in patient at increased risk: Family                            history of 1st-degree relative with colorectal                            cancer before age 75 years, Last colonoscopy: 2015,                            also with notation of mild inflammation in the                            sigmoid colon on colonoscopy 2015 -biopsies showed                            chronic active colitis. Patient denies any symptoms                            of bowel inflammation at the time of today's                            procedure. Medicines:                Monitored Anesthesia Care Procedure:                Pre-Anesthesia Assessment:                           - Prior to the procedure, a History and Physical                            was performed, and patient medications and                            allergies were reviewed. The patient's tolerance of                            previous anesthesia was also reviewed. The risks                            and benefits of the procedure and the sedation                            options and risks were discussed with the patient.                            All questions were answered, and informed consent  was obtained. Prior Anticoagulants: The patient has                            taken no anticoagulant or antiplatelet agents. ASA                            Grade Assessment: II - A patient with mild systemic                            disease. After reviewing the risks and benefits,                            the patient was deemed in satisfactory condition to                            undergo the procedure.                            After obtaining informed consent, the colonoscope                            was passed under direct vision. Throughout the                            procedure, the patient's blood pressure, pulse, and                            oxygen saturations were monitored continuously. The                            CF [id number] #[id number] was introduced through the anus                            and advanced to the cecum, identified by                            appendiceal orifice and ileocecal valve. The                            colonoscopy was performed without difficulty. The                            patient tolerated the procedure well. The quality                            of the bowel preparation was inadequate. The                            appendiceal orifice and the rectum were                            photographed. Scope In: 8:48:52 AM Scope Out: 9:08:22 AM Scope Withdrawal Time: 0 hours 9 minutes 48 seconds  Total Procedure Duration: 0 hours 19 minutes 30  seconds  Findings:                 The perianal and digital rectal examinations were                            normal. Pertinent negatives include normal                            sphincter tone and no palpable rectal lesions.                           A moderate amount of semi-liquid stool was found in                            the entire colon, interfering with visualization.                            Lavage of the area was performed using a large                            amount of sterile water, resulting in incomplete                            clearance with fair visualization.                           The colon (entire examined portion) appeared normal                            within the limitations of today's exam with stool                            present throughout the colon. Mass lesions were not                            seen. Stool present may have obscured polyps.                            Internal hemorrhoids were found during retroflexion. Complications:            No immediate complications. Estimated blood loss:                            None. Estimated Blood Loss:     Estimated blood loss: none. Impression:               - Preparation of the colon was inadequate.                           - Stool in the entire examined colon.                           - The entire examined colon is normal.                           -  Internal hemorrhoids.                           - No specimens collected. Recommendation:           - Discharge patient to home (ambulatory).                           - Await pathology results.                           - Repeat colonoscopy within the next 6 to 12 months                            because the bowel preparation was suboptimal.                            Suboptimal preparation may be related to slow                            colonic motility from Mounjaro  use. Patient will                            require a 2-day bowel prep prior to next                            colonoscopy.                           - The findings and recommendations were discussed                            with the patient's family.                           - Return to referring physician.                           - Patient has a contact number available for                            emergencies. The signs and symptoms of potential                            delayed complications were discussed with the                            patient. Return to normal activities tomorrow.                            Written discharge instructions were provided to the                            patient. Inocente Hausen, MD 11/21/2023 9:13:48 AM This report has been signed electronically.

## 2023-11-22 ENCOUNTER — Telehealth: Payer: Self-pay | Admitting: *Deleted

## 2023-11-22 NOTE — Telephone Encounter (Signed)
  Follow up Call-     11/21/2023    7:54 AM  Call back number  Post procedure Call Back phone  # [phone]  Permission to leave phone message Yes     Patient questions:  Do you have a fever, pain , or abdominal swelling? No. Pain Score  0 *  Have you tolerated food without any problems? Yes.    Have you been able to return to your normal activities? Yes.    Do you have any questions about your discharge instructions: Diet   No. Medications  No. Follow up visit  No.  Do you have questions or concerns about your Care? No.  Actions: * If pain score is 4 or above: No action needed, pain <4.

## 2023-12-14 ENCOUNTER — Other Ambulatory Visit (HOSPITAL_COMMUNITY): Payer: Self-pay

## 2024-01-02 ENCOUNTER — Ambulatory Visit: Admitting: Adult Health

## 2024-01-02 ENCOUNTER — Other Ambulatory Visit (HOSPITAL_COMMUNITY): Payer: Self-pay

## 2024-01-02 ENCOUNTER — Encounter: Payer: Self-pay | Admitting: Adult Health

## 2024-01-02 ENCOUNTER — Other Ambulatory Visit: Payer: Self-pay | Admitting: Adult Health

## 2024-01-02 ENCOUNTER — Ambulatory Visit: Payer: Self-pay | Admitting: Adult Health

## 2024-01-02 VITALS — BP 120/82 | HR 64 | Temp 98.1°F | Ht 65.0 in | Wt 162.0 lb

## 2024-01-02 DIAGNOSIS — Z7984 Long term (current) use of oral hypoglycemic drugs: Secondary | ICD-10-CM | POA: Diagnosis not present

## 2024-01-02 DIAGNOSIS — I10 Essential (primary) hypertension: Secondary | ICD-10-CM

## 2024-01-02 DIAGNOSIS — Z7985 Long-term (current) use of injectable non-insulin antidiabetic drugs: Secondary | ICD-10-CM

## 2024-01-02 DIAGNOSIS — Z6826 Body mass index (BMI) 26.0-26.9, adult: Secondary | ICD-10-CM

## 2024-01-02 DIAGNOSIS — E119 Type 2 diabetes mellitus without complications: Secondary | ICD-10-CM

## 2024-01-02 DIAGNOSIS — Z0000 Encounter for general adult medical examination without abnormal findings: Secondary | ICD-10-CM | POA: Diagnosis not present

## 2024-01-02 DIAGNOSIS — E039 Hypothyroidism, unspecified: Secondary | ICD-10-CM | POA: Diagnosis not present

## 2024-01-02 LAB — CBC WITH DIFFERENTIAL/PLATELET
Basophils Absolute: 0 K/uL (ref 0.0–0.1)
Basophils Relative: 0.7 % (ref 0.0–3.0)
Eosinophils Absolute: 0.3 K/uL (ref 0.0–0.7)
Eosinophils Relative: 4.8 % (ref 0.0–5.0)
HCT: 43.2 % (ref 36.0–46.0)
Hemoglobin: 13.6 g/dL (ref 12.0–15.0)
Lymphocytes Relative: 40.5 % (ref 12.0–46.0)
Lymphs Abs: 2.2 K/uL (ref 0.7–4.0)
MCHC: 31.5 g/dL (ref 30.0–36.0)
MCV: 72.6 fl — ABNORMAL LOW (ref 78.0–100.0)
Monocytes Absolute: 0.5 K/uL (ref 0.1–1.0)
Monocytes Relative: 8.7 % (ref 3.0–12.0)
Neutro Abs: 2.5 K/uL (ref 1.4–7.7)
Neutrophils Relative %: 45.3 % (ref 43.0–77.0)
Platelets: 246 K/uL (ref 150.0–400.0)
RBC: 5.96 Mil/uL — ABNORMAL HIGH (ref 3.87–5.11)
RDW: 15.5 % (ref 11.5–15.5)
WBC: 5.5 K/uL (ref 4.0–10.5)

## 2024-01-02 LAB — TSH: TSH: 3.22 u[IU]/mL (ref 0.35–5.50)

## 2024-01-02 LAB — COMPREHENSIVE METABOLIC PANEL WITH GFR
ALT: 31 U/L (ref 0–35)
AST: 25 U/L (ref 0–37)
Albumin: 4.5 g/dL (ref 3.5–5.2)
Alkaline Phosphatase: 137 U/L — ABNORMAL HIGH (ref 39–117)
BUN: 14 mg/dL (ref 6–23)
CO2: 27 meq/L (ref 19–32)
Calcium: 9.6 mg/dL (ref 8.4–10.5)
Chloride: 103 meq/L (ref 96–112)
Creatinine, Ser: 0.85 mg/dL (ref 0.40–1.20)
GFR: 73.26 mL/min (ref >60.00)
Glucose, Bld: 101 mg/dL — ABNORMAL HIGH (ref 70–99)
Potassium: 4 meq/L (ref 3.5–5.1)
Sodium: 140 meq/L (ref 135–145)
Total Bilirubin: 0.5 mg/dL (ref 0.2–1.2)
Total Protein: 7.9 g/dL (ref 6.0–8.3)

## 2024-01-02 LAB — LIPID PANEL
Cholesterol: 182 mg/dL (ref 0–200)
HDL: 56.7 mg/dL (ref >39.00)
LDL Cholesterol: 109 mg/dL — ABNORMAL HIGH (ref 0–99)
NonHDL: 125.51
Total CHOL/HDL Ratio: 3
Triglycerides: 82 mg/dL (ref 0.0–149.0)
VLDL: 16.4 mg/dL (ref 0.0–40.0)

## 2024-01-02 LAB — MICROALBUMIN / CREATININE URINE RATIO
Creatinine,U: 174.5 mg/dL
Microalb Creat Ratio: 17.3 mg/g (ref 0.0–30.0)
Microalb, Ur: 3 mg/dL — ABNORMAL HIGH (ref 0.0–1.9)

## 2024-01-02 LAB — T3, FREE: T3, Free: 3.4 pg/mL (ref 2.3–4.2)

## 2024-01-02 LAB — T4, FREE: Free T4: 0.88 ng/dL (ref 0.60–1.60)

## 2024-01-02 LAB — HEMOGLOBIN A1C: Hgb A1c MFr Bld: 6.6 % — ABNORMAL HIGH (ref 4.6–6.5)

## 2024-01-02 MED ORDER — LISINOPRIL 40 MG PO TABS
40.0000 mg | ORAL_TABLET | Freq: Every day | ORAL | 3 refills | Status: AC
  Filled 2024-01-02 – 2024-01-14 (×2): qty 30, 30d supply, fill #0
  Filled 2024-02-08: qty 30, 30d supply, fill #1
  Filled 2024-03-10: qty 30, 30d supply, fill #2
  Filled 2024-04-07: qty 30, 30d supply, fill #3
  Filled 2024-05-09: qty 30, 30d supply, fill #4

## 2024-01-02 MED ORDER — AMLODIPINE BESYLATE 2.5 MG PO TABS
2.5000 mg | ORAL_TABLET | Freq: Every day | ORAL | 3 refills | Status: AC
  Filled 2024-01-02 – 2024-01-14 (×2): qty 30, 30d supply, fill #0
  Filled 2024-02-08: qty 30, 30d supply, fill #1
  Filled 2024-03-10: qty 30, 30d supply, fill #2
  Filled 2024-04-07: qty 30, 30d supply, fill #3
  Filled 2024-05-09: qty 30, 30d supply, fill #4

## 2024-01-02 MED ORDER — GLIPIZIDE ER 10 MG PO TB24
10.0000 mg | ORAL_TABLET | Freq: Every day | ORAL | 1 refills | Status: AC
  Filled 2024-01-02 – 2024-02-04 (×2): qty 30, 30d supply, fill #0
  Filled 2024-03-07: qty 30, 30d supply, fill #1
  Filled 2024-04-11: qty 30, 30d supply, fill #2
  Filled 2024-05-09: qty 30, 30d supply, fill #3

## 2024-01-02 NOTE — Progress Notes (Signed)
 Subjective:    Patient ID: Autumn Acevedo, female    DOB: 06-03-1960, 63 y.o.   MRN: [id number]  HPI Patient presents for yearly preventative medicine examination. She is a pleasant 63 year old female who  has a past medical history of Allergy, Diabetes mellitus without complication (HCC), Goiter, Hypertension, Hypothyroid, and ITP (idiopathic thrombocytopenic purpura).  Diabetes mellitus type II-currently managed with glipizide  10 mg daily and Mounjaro  15 mg weekly.  She does monitor her blood sugars at home with readings in the 100's. She denies hypoglycemic events. She has been walking more frequently and eating healthy.  Since starting Mounjaro  her portion sizes have decreased. .  Overall she is feeling well.  Lab Results  Component Value Date   HGBA1C 5.7 (A) 09/27/2023   HGBA1C 5.6 06/27/2023   HGBA1C 5.7 (A) 03/29/2023   Wt Readings from Last 3 Encounters:  01/02/24 162 lb (73.5 kg)  11/21/23 160 lb (72.6 kg)  11/06/23 160 lb (72.6 kg)   Hypertension -managed with Norvasc  2.5 mg daily and lisinopril  40 mg daily.  She denies dizziness, lightheadedness, chest pain, or shortness of breath BP Readings from Last 3 Encounters:  01/02/24 120/82  11/21/23 (!) 138/91  09/27/23 110/80   Hypothyroidism She is not currently taking synthroid . She had a right sided thyroidectomy in April 2025. She has recovered well.   All immunizations and health maintenance protocols were reviewed with the patient and needed orders were placed.  Appropriate screening laboratory values were ordered for the patient including screening of hyperlipidemia, renal function and hepatic function.  Medication reconciliation,  past medical history, social history, problem list and allergies were reviewed in detail with the patient  Goals were established with regard to weight loss, exercise, and  diet in compliance with medications  She is up to date on routine mammograms, and colon cancer screening. She is  overdue for cervical cancer screening and will call and schedule.   Review of Systems  Constitutional: Negative.   HENT: Negative.    Eyes: Negative.   Respiratory: Negative.    Cardiovascular: Negative.   Gastrointestinal: Negative.   Endocrine: Negative.   Genitourinary: Negative.   Musculoskeletal: Negative.   Skin: Negative.   Allergic/Immunologic: Negative.   Neurological: Negative.   Hematological: Negative.   Psychiatric/Behavioral: Negative.     Past Medical History:  Diagnosis Date   Allergy    Diabetes mellitus without complication (HCC)    Type2    Goiter    Hypertension    Hypothyroid    ITP (idiopathic thrombocytopenic purpura)     Social History   Socioeconomic History   Marital status: Single    Spouse name: Not on file   Number of children: Not on file   Years of education: Not on file   Highest education level: Associate degree: occupational, Scientist, product/process development, or vocational program  Occupational History   Not on file  Tobacco Use   Smoking status: Never   Smokeless tobacco: Never   Tobacco comments:    never used tobacco  Vaping Use   Vaping status: Never Used  Substance and Sexual Activity   Alcohol use: Yes    Alcohol/week: 1.0 standard drink of alcohol    Types: 1 Standard drinks or equivalent per week    Comment: 2-3 drinks a week    Drug use: No   Sexual activity: Not on file  Other Topics Concern   Not on file  Social History Narrative   Works for an Community education officer  company    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 Has two grand babies   She enjoys reading and going to the gym. Hanging out with friends.       Social Drivers of Corporate investment banker Strain: Low Risk  (07/30/2023)   Received from [facility]   Overall Financial Resource Strain (CARDIA)    Difficulty of Paying Living Expenses: Not very hard  Food Insecurity: No Food Insecurity (07/30/2023)   Received from [facility]   Hunger Vital Sign    Within the past 12 months, you worried that your food would run out before you got the money to buy more.: Never true    Within the past 12 months, the food you bought just didn't last and you didn't have money to get more.: Never true  Transportation Needs: Unknown (07/30/2023)   Received from [facility] - Transportation    In the past 12 months, has lack of transportation kept you from medical appointments or from getting medications?: No    Lack of Transportation (Non-Medical): Not on file  Physical Activity: Insufficiently Active (07/18/2021)   Exercise Vital Sign    Days of Exercise per Week: 3 days    Minutes of Exercise per Session: 40 min  Stress: No Stress Concern Present (07/18/2021)   [employer] of Occupational Health - Occupational Stress Questionnaire    Feeling of Stress : Not at all  Social Connections: Moderately Integrated (07/18/2021)   Social Connection and Isolation Panel    Frequency of Communication with Friends and Family: More than three times a week    Frequency of Social Gatherings with Friends and Family: More than three times a week    Attends Religious Services: More than 4 times per year    Active Member of [employer] or Organizations: Yes    Attends Engineer, structural: More than 4 times per year    Marital Status: Never married  Catering manager Violence: Not on file    Past Surgical History:  Procedure Laterality Date   no surgeries at this time      Family History  Problem Relation Age of Onset   Hypertension Mother        fhx   COPD Father    Lung cancer Father    Colon cancer Sister    Pancreatic cancer Brother    Stomach cancer Neg Hx    Rectal cancer Neg Hx     No Known Allergies  Current Outpatient Medications on File Prior to Visit  Medication Sig Dispense Refill   Accu-Chek Softclix Lancets lancets Use as instructed to check glucose daily 100 each 12   albuterol   (VENTOLIN  HFA) 108 (90 Base) MCG/ACT inhaler Inhale 2 puffs into the lungs every 6 (six) hours as needed for wheezing. 18 each 2   amLODipine  (NORVASC ) 2.5 MG tablet Take 1 tablet (2.5 mg total) by mouth daily. 90 tablet 3   blood glucose meter kit and supplies KIT Dispense based on patient and insurance preference. Use up to four times daily as directed. 1 each 0   glipiZIDE  (GLUCOTROL  XL) 10 MG 24 hr tablet Take 1 tablet (10 mg total) by mouth daily with breakfast. 90 tablet 0   glucose blood (ACCU-CHEK GUIDE TEST) test strip Use as instructed to check glucose daily 100 each 12   levocetirizine (XYZAL) 2.5 MG/5ML solution Take  2.5 mg by mouth every evening.     lisinopril  (ZESTRIL ) 40 MG tablet Take 1 tablet (40 mg total) by mouth daily. 90 tablet 3   Multiple Vitamin (MULTIVITAMIN WITH MINERALS) TABS Take 1 tablet by mouth daily.     omeprazole  (PRILOSEC) 20 MG capsule Take 1 capsule (20 mg total) by mouth daily. 90 capsule 1   tirzepatide  (MOUNJARO ) 15 MG/0.5ML Pen Inject 15 mg into the skin once a week. 6 mL 1   No current facility-administered medications on file prior to visit.    BP 120/82   Pulse 64   Temp 98.1 F (36.7 C) (Oral)   Ht 5' 5 (1.651 m)   Wt 162 lb (73.5 kg)   LMP 05/17/2016   SpO2 98%   BMI 26.96 kg/m       Objective:   Physical Exam Vitals and nursing note reviewed.  Constitutional:      General: She is not in acute distress.    Appearance: Normal appearance. She is not ill-appearing.  HENT:     Head: Normocephalic and atraumatic.     Right Ear: Tympanic membrane, ear canal and external ear normal. There is no impacted cerumen.     Left Ear: Tympanic membrane, ear canal and external ear normal. There is no impacted cerumen.     Nose: Nose normal. No congestion or rhinorrhea.     Mouth/Throat:     Mouth: Mucous membranes are moist.     Pharynx: Oropharynx is clear.  Eyes:     Extraocular Movements: Extraocular movements intact.      Conjunctiva/sclera: Conjunctivae normal.     Pupils: Pupils are equal, round, and reactive to light.  Neck:     Vascular: No carotid bruit.  Cardiovascular:     Rate and Rhythm: Normal rate and regular rhythm.     Pulses: Normal pulses.     Heart sounds: No murmur heard.    No friction rub. No gallop.  Pulmonary:     Effort: Pulmonary effort is normal.     Breath sounds: Normal breath sounds.  Abdominal:     General: Abdomen is flat. Bowel sounds are normal. There is no distension.     Palpations: Abdomen is soft. There is no mass.     Tenderness: There is no abdominal tenderness. There is no guarding or rebound.     Hernia: No hernia is present.  Musculoskeletal:        General: Normal range of motion.     Cervical back: Normal range of motion and neck supple.  Lymphadenopathy:     Cervical: No cervical adenopathy.  Skin:    General: Skin is warm and dry.     Capillary Refill: Capillary refill takes less than 2 seconds.  Neurological:     General: No focal deficit present.     Mental Status: She is alert and oriented to person, place, and time.  Psychiatric:        Mood and Affect: Mood normal.        Behavior: Behavior normal.        Thought Content: Thought content normal.        Judgment: Judgment normal.        Assessment & Plan:  1. Routine general medical examination at a health care facility (Primary) Today patient counseled on age appropriate routine health concerns for screening and prevention, each reviewed and up to date or declined. Immunizations reviewed and up to date or declined. Labs ordered and  reviewed. Risk factors for depression reviewed and negative. Hearing function and visual acuity are intact. ADLs screened and addressed as needed. Functional ability and level of safety reviewed and appropriate. Education, counseling and referrals performed based on assessed risks today. Patient provided with a copy of personalized plan for preventive services.   2.  Diabetes mellitus treated with oral medication (HCC) - Consider d/c glipizide   - Likely follow up in 6 months  - CBC with Differential/Platelet; Future - Comprehensive metabolic panel with GFR; Future - Lipid panel; Future - TSH; Future - Hemoglobin A1c; Future - Microalbumin/Creatinine Ratio, Urine; Future  3. Long-term current use of injectable noninsulin antidiabetic medication - Continue with Mounjaro  15 mg weekly. - CBC with Differential/Platelet; Future - Comprehensive metabolic panel with GFR; Future - Lipid panel; Future - TSH; Future - Hemoglobin A1c; Future - Microalbumin/Creatinine Ratio, Urine; Future  4. Essential hypertension - Well controlled. No change  - CBC with Differential/Platelet; Future - Comprehensive metabolic panel with GFR; Future - Lipid panel; Future - TSH; Future - amLODipine  (NORVASC ) 2.5 MG tablet; Take 1 tablet (2.5 mg total) by mouth daily.  Dispense: 90 tablet; Refill: 3 - lisinopril  (ZESTRIL ) 40 MG tablet; Take 1 tablet (40 mg total) by mouth daily.  Dispense: 90 tablet; Refill: 3  5. Acquired hypothyroidism - Consider restarting synthroid   - CBC with Differential/Platelet; Future - Comprehensive metabolic panel with GFR; Future - Lipid panel; Future - TSH; Future - T3, Free; Future - T4, Free; Future  6. BMI 26.0-26.9,adult - Continue to work on weight loss through lifestyle modifications  - CBC with Differential/Platelet; Future - Comprehensive metabolic panel with GFR; Future - Lipid panel; Future - TSH; Future  Darleene Shape, NP

## 2024-01-04 ENCOUNTER — Telehealth: Payer: Self-pay

## 2024-01-04 ENCOUNTER — Other Ambulatory Visit (HOSPITAL_COMMUNITY): Payer: Self-pay

## 2024-01-04 MED ORDER — ROSUVASTATIN CALCIUM 5 MG PO TABS
5.0000 mg | ORAL_TABLET | Freq: Every day | ORAL | 0 refills | Status: DC
  Filled 2024-01-04: qty 30, 30d supply, fill #0
  Filled 2024-02-06: qty 30, 30d supply, fill #1
  Filled 2024-03-07: qty 30, 30d supply, fill #2

## 2024-01-04 NOTE — Telephone Encounter (Signed)
 Pt dropped off form at CPE. Form placed in cabinet.

## 2024-01-11 ENCOUNTER — Other Ambulatory Visit (HOSPITAL_COMMUNITY): Payer: Self-pay

## 2024-01-14 ENCOUNTER — Other Ambulatory Visit (HOSPITAL_COMMUNITY): Payer: Self-pay

## 2024-01-29 ENCOUNTER — Other Ambulatory Visit: Payer: Self-pay | Admitting: Adult Health

## 2024-01-29 DIAGNOSIS — Z1231 Encounter for screening mammogram for malignant neoplasm of breast: Secondary | ICD-10-CM

## 2024-02-04 ENCOUNTER — Other Ambulatory Visit (HOSPITAL_COMMUNITY): Payer: Self-pay

## 2024-02-26 ENCOUNTER — Ambulatory Visit
Admission: RE | Admit: 2024-02-26 | Discharge: 2024-02-26 | Disposition: A | Discharge status: Home / Self Care | Source: Ambulatory Visit | Attending: Adult Health | Admitting: Adult Health

## 2024-02-26 DIAGNOSIS — Z1231 Encounter for screening mammogram for malignant neoplasm of breast: Secondary | ICD-10-CM

## 2024-03-25 ENCOUNTER — Encounter: Payer: Self-pay | Admitting: Adult Health

## 2024-03-25 ENCOUNTER — Ambulatory Visit: Admitting: Adult Health

## 2024-03-25 ENCOUNTER — Ambulatory Visit: Payer: Self-pay | Admitting: Adult Health

## 2024-03-25 ENCOUNTER — Other Ambulatory Visit (HOSPITAL_COMMUNITY): Payer: Self-pay

## 2024-03-25 VITALS — BP 120/88 | Temp 98.7°F | Ht 65.0 in | Wt 156.0 lb

## 2024-03-25 DIAGNOSIS — E119 Type 2 diabetes mellitus without complications: Secondary | ICD-10-CM

## 2024-03-25 DIAGNOSIS — Z7985 Long-term (current) use of injectable non-insulin antidiabetic drugs: Secondary | ICD-10-CM

## 2024-03-25 DIAGNOSIS — I10 Essential (primary) hypertension: Secondary | ICD-10-CM | POA: Diagnosis not present

## 2024-03-25 DIAGNOSIS — Z7984 Long term (current) use of oral hypoglycemic drugs: Secondary | ICD-10-CM | POA: Diagnosis not present

## 2024-03-25 DIAGNOSIS — E785 Hyperlipidemia, unspecified: Secondary | ICD-10-CM

## 2024-03-25 DIAGNOSIS — E1169 Type 2 diabetes mellitus with other specified complication: Secondary | ICD-10-CM

## 2024-03-25 DIAGNOSIS — R748 Abnormal levels of other serum enzymes: Secondary | ICD-10-CM

## 2024-03-25 LAB — COMPREHENSIVE METABOLIC PANEL WITH GFR
ALT: 60 U/L — ABNORMAL HIGH (ref 0–35)
AST: 62 U/L — ABNORMAL HIGH (ref 0–37)
Albumin: 4.6 g/dL (ref 3.5–5.2)
Alkaline Phosphatase: 193 U/L — ABNORMAL HIGH (ref 39–117)
BUN: 16 mg/dL (ref 6–23)
CO2: 28 meq/L (ref 19–32)
Calcium: 9.4 mg/dL (ref 8.4–10.5)
Chloride: 104 meq/L (ref 96–112)
Creatinine, Ser: 0.93 mg/dL (ref 0.40–1.20)
GFR: 65.66 mL/min (ref >60.00)
Glucose, Bld: 90 mg/dL (ref 70–99)
Potassium: 4.3 meq/L (ref 3.5–5.1)
Sodium: 139 meq/L (ref 135–145)
Total Bilirubin: 0.6 mg/dL (ref 0.2–1.2)
Total Protein: 7.8 g/dL (ref 6.0–8.3)

## 2024-03-25 LAB — LIPID PANEL
Cholesterol: 130 mg/dL (ref 0–200)
HDL: 60.4 mg/dL (ref >39.00)
LDL Cholesterol: 56 mg/dL (ref 0–99)
NonHDL: 69.44
Total CHOL/HDL Ratio: 2
Triglycerides: 69 mg/dL (ref 0.0–149.0)
VLDL: 13.8 mg/dL (ref 0.0–40.0)

## 2024-03-25 LAB — HEMOGLOBIN A1C: Hgb A1c MFr Bld: 5.7 % (ref 4.6–6.5)

## 2024-03-25 MED ORDER — MOUNJARO 15 MG/0.5ML ~~LOC~~ SOAJ
15.0000 mg | SUBCUTANEOUS | 1 refills | Status: AC
  Filled 2024-03-25 – 2024-04-10 (×3): qty 2, 28d supply, fill #0
  Filled 2024-05-02: qty 2, 28d supply, fill #1
  Filled ????-??-??: fill #2

## 2024-03-25 NOTE — Progress Notes (Addendum)
 Subjective:    Patient ID: Autumn Acevedo, female    DOB: Apr 14, 1961, 63 y.o.   MRN: [id number]  HPI 63 year old female who  has a past medical history of Allergy, Diabetes mellitus without complication (HCC), Goiter, Hypertension, Hypothyroid, and ITP (idiopathic thrombocytopenic purpura).  Diabetes mellitus type II-currently managed with glipizide  10 mg daily and Mounjaro  15 mg weekly.  She does monitor her blood sugars at home with readings in the 100's. She denies hypoglycemic events. She has been walking more frequently and eating healthy.  Since starting Mounjaro  her portion sizes have decreased. .  Overall she is feeling well.  Lab Results  Component Value Date   HGBA1C 6.6 (H) 01/02/2024   HGBA1C 5.7 (A) 09/27/2023   HGBA1C 5.6 06/27/2023   Wt Readings from Last 3 Encounters:  03/25/24 156 lb (70.8 kg)  01/02/24 162 lb (73.5 kg)  11/21/23 160 lb (72.6 kg)   Hypertension -managed with Norvasc  2.5 mg daily and lisinopril  40 mg daily.  She denies dizziness, lightheadedness, chest pain, or shortness of breath BP Readings from Last 3 Encounters:  03/25/24 120/88  01/02/24 120/82  11/21/23 (!) 138/91   Hyperlipidemia - she was started on Crestor  5 mg daily during her CPE three months ago. She is tolerating this medication well without side effects  Lab Results  Component Value Date   CHOL 182 01/02/2024   HDL 56.70 01/02/2024   LDLCALC 109 (H) 01/02/2024   LDLDIRECT 91.8 04/17/2007   TRIG 82.0 01/02/2024   CHOLHDL 3 01/02/2024   Review of Systems See HPI   Past Medical History:  Diagnosis Date   Allergy    Diabetes mellitus without complication (HCC)    Type2    Goiter    Hypertension    Hypothyroid    ITP (idiopathic thrombocytopenic purpura)     Social History   Socioeconomic History   Marital status: Single    Spouse name: Not on file   Number of children: Not on file   Years of education: Not on file   Highest education level: Associate degree:  occupational, scientist, product/process development, or vocational program  Occupational History   Not on file  Tobacco Use   Smoking status: Never   Smokeless tobacco: Never   Tobacco comments:    never used tobacco  Vaping Use   Vaping status: Never Used  Substance and Sexual Activity   Alcohol use: Yes    Alcohol/week: 1.0 standard drink of alcohol    Types: 1 Standard drinks or equivalent per week    Comment: 2-3 drinks a week    Drug use: No   Sexual activity: Not on file  Other Topics Concern   Not on file  Social History Narrative   Works for an scientist, forensic    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 Has two grand babies   She enjoys reading and going to the gym. Hanging out with friends.       Social Drivers of Corporate Investment Banker Strain: Low Risk  (07/30/2023)   Received from [facility]   Overall Financial Resource Strain (CARDIA)    Difficulty of Paying Living Expenses: Not very hard  Food Insecurity: No Food Insecurity (07/30/2023)   Received from [facility]   Hunger Vital Sign    Within the past 12 months, you worried that your food would run out before you got the money  to buy more.: Never true    Within the past 12 months, the food you bought just didn't last and you didn't have money to get more.: Never true  Transportation Needs: Unknown (07/30/2023)   Received from [facility] - Transportation    In the past 12 months, has lack of transportation kept you from medical appointments or from getting medications?: No    Lack of Transportation (Non-Medical): Not on file  Physical Activity: Insufficiently Active (07/18/2021)   Exercise Vital Sign    Days of Exercise per Week: 3 days    Minutes of Exercise per Session: 40 min  Stress: No Stress Concern Present (07/18/2021)   [employer] of Occupational Health - Occupational Stress Questionnaire    Feeling of Stress : Not at all  Social  Connections: Moderately Integrated (07/18/2021)   Social Connection and Isolation Panel    Frequency of Communication with Friends and Family: More than three times a week    Frequency of Social Gatherings with Friends and Family: More than three times a week    Attends Religious Services: More than 4 times per year    Active Member of [employer] or Organizations: Yes    Attends Engineer, Structural: More than 4 times per year    Marital Status: Never married  Catering Manager Violence: Not on file    Past Surgical History:  Procedure Laterality Date   no surgeries at this time      Family History  Problem Relation Age of Onset   Hypertension Mother        fhx   COPD Father    Lung cancer Father    Colon cancer Sister    Pancreatic cancer Brother    Stomach cancer Neg Hx    Rectal cancer Neg Hx    Breast cancer Neg Hx     No Known Allergies  Current Outpatient Medications on File Prior to Visit  Medication Sig Dispense Refill   Accu-Chek Softclix Lancets lancets Use as instructed to check glucose daily 100 each 12   albuterol  (VENTOLIN  HFA) 108 (90 Base) MCG/ACT inhaler Inhale 2 puffs into the lungs every 6 (six) hours as needed for wheezing. 18 each 2   amLODipine  (NORVASC ) 2.5 MG tablet Take 1 tablet (2.5 mg total) by mouth daily. 90 tablet 3   blood glucose meter kit and supplies KIT Dispense based on patient and insurance preference. Use up to four times daily as directed. 1 each 0   glipiZIDE  (GLUCOTROL  XL) 10 MG 24 hr tablet Take 1 tablet (10 mg total) by mouth daily with breakfast. 90 tablet 1   glucose blood (ACCU-CHEK GUIDE TEST) test strip Use as instructed to check glucose daily 100 each 12   levocetirizine (XYZAL) 2.5 MG/5ML solution Take 2.5 mg by mouth every evening.     lisinopril  (ZESTRIL ) 40 MG tablet Take 1 tablet (40 mg total) by mouth daily. 90 tablet 3   Multiple Vitamin (MULTIVITAMIN WITH MINERALS) TABS Take 1 tablet by mouth daily.     omeprazole   (PRILOSEC) 20 MG capsule Take 1 capsule (20 mg total) by mouth daily. 90 capsule 1   rosuvastatin  (CRESTOR ) 5 MG tablet Take 1 tablet (5 mg total) by mouth daily. 90 tablet 0   No current facility-administered medications on file prior to visit.    BP 120/88   Temp 98.7 F (37.1 C) (Oral)   Ht 5' 5 (1.651 m)   Wt 156 lb (  70.8 kg)   LMP 05/17/2016   BMI 25.96 kg/m       Objective:   Physical Exam Vitals and nursing note reviewed.  Constitutional:      Appearance: Normal appearance. She is obese.  Cardiovascular:     Rate and Rhythm: Normal rate and regular rhythm.     Pulses: Normal pulses.     Heart sounds: Normal heart sounds.  Pulmonary:     Effort: Pulmonary effort is normal.     Breath sounds: Normal breath sounds.  Skin:    General: Skin is warm and dry.  Neurological:     General: No focal deficit present.     Mental Status: She is alert and oriented to person, place, and time.  Psychiatric:        Mood and Affect: Mood normal.        Behavior: Behavior normal.        Thought Content: Thought content normal.        Judgment: Judgment normal.         Assessment & Plan:  1. Diabetes mellitus treated with oral medication (HCC) (Primary) - Consider adding agent - Hemoglobin A1c; Future  2. Long-term current use of injectable noninsulin antidiabetic medication - Continue with Mounjaro  15 mg weekly - Hemoglobin A1c; Future - tirzepatide  (MOUNJARO ) 15 MG/0.5ML Pen; Inject 15 mg into the skin once a week.  Dispense: 6 mL; Refill: 1  3. Essential hypertension - well controlled. No change in medications  4. Hyperlipidemia associated with type 2 diabetes mellitus (HCC) - Consider increasing statin  - Lipid panel; Future - Comprehensive metabolic panel with GFR; Future  Darleene Shape, NP

## 2024-03-28 ENCOUNTER — Other Ambulatory Visit: Payer: Self-pay

## 2024-03-28 ENCOUNTER — Other Ambulatory Visit (HOSPITAL_COMMUNITY): Payer: Self-pay

## 2024-04-07 ENCOUNTER — Other Ambulatory Visit: Payer: Self-pay | Admitting: Adult Health

## 2024-04-07 ENCOUNTER — Other Ambulatory Visit: Payer: Self-pay

## 2024-04-09 ENCOUNTER — Other Ambulatory Visit (HOSPITAL_COMMUNITY): Payer: Self-pay

## 2024-04-10 ENCOUNTER — Other Ambulatory Visit: Payer: Self-pay

## 2024-04-10 ENCOUNTER — Other Ambulatory Visit (HOSPITAL_COMMUNITY): Payer: Self-pay

## 2024-04-10 MED ORDER — ROSUVASTATIN CALCIUM 5 MG PO TABS
5.0000 mg | ORAL_TABLET | Freq: Every day | ORAL | 0 refills | Status: AC
  Filled 2024-04-10: qty 30, 30d supply, fill #0
  Filled 2024-05-05 – 2024-05-29 (×4): qty 30, 30d supply, fill #1

## 2024-04-30 ENCOUNTER — Other Ambulatory Visit

## 2024-04-30 DIAGNOSIS — R748 Abnormal levels of other serum enzymes: Secondary | ICD-10-CM | POA: Diagnosis not present

## 2024-04-30 LAB — COMPREHENSIVE METABOLIC PANEL WITH GFR
ALT: 53 U/L — ABNORMAL HIGH (ref 3–35)
AST: 50 U/L — ABNORMAL HIGH (ref 5–37)
Albumin: 4.6 g/dL (ref 3.5–5.2)
Alkaline Phosphatase: 185 U/L — ABNORMAL HIGH (ref 39–117)
BUN: 13 mg/dL (ref 6–23)
CO2: 29 meq/L (ref 19–32)
Calcium: 9.2 mg/dL (ref 8.4–10.5)
Chloride: 105 meq/L (ref 96–112)
Creatinine, Ser: 0.91 mg/dL (ref 0.40–1.20)
GFR: 67.35 mL/min
Glucose, Bld: 88 mg/dL (ref 70–99)
Potassium: 4 meq/L (ref 3.5–5.1)
Sodium: 141 meq/L (ref 135–145)
Total Bilirubin: 0.6 mg/dL (ref 0.2–1.2)
Total Protein: 7.9 g/dL (ref 6.0–8.3)

## 2024-05-01 ENCOUNTER — Ambulatory Visit: Payer: Self-pay | Admitting: Adult Health

## 2024-05-01 DIAGNOSIS — R748 Abnormal levels of other serum enzymes: Secondary | ICD-10-CM

## 2024-05-01 NOTE — Telephone Encounter (Signed)
 Please advise

## 2024-05-02 ENCOUNTER — Other Ambulatory Visit (HOSPITAL_COMMUNITY): Payer: Self-pay

## 2024-05-07 ENCOUNTER — Inpatient Hospital Stay: Admission: RE | Admit: 2024-05-07 | Discharge: 2024-05-07 | Attending: Adult Health

## 2024-05-07 DIAGNOSIS — R748 Abnormal levels of other serum enzymes: Secondary | ICD-10-CM

## 2024-05-08 ENCOUNTER — Other Ambulatory Visit: Payer: Self-pay | Admitting: Adult Health

## 2024-05-08 ENCOUNTER — Other Ambulatory Visit (HOSPITAL_COMMUNITY): Payer: Self-pay

## 2024-05-08 ENCOUNTER — Ambulatory Visit: Payer: Self-pay | Admitting: Adult Health

## 2024-05-08 DIAGNOSIS — K760 Fatty (change of) liver, not elsewhere classified: Secondary | ICD-10-CM

## 2024-05-15 ENCOUNTER — Other Ambulatory Visit (HOSPITAL_COMMUNITY): Payer: Self-pay

## 2024-05-23 ENCOUNTER — Ambulatory Visit: Payer: Self-pay | Admitting: Adult Health

## 2024-05-23 ENCOUNTER — Ambulatory Visit
Admission: RE | Admit: 2024-05-23 | Discharge: 2024-05-23 | Disposition: A | Source: Ambulatory Visit | Attending: Adult Health

## 2024-05-23 DIAGNOSIS — K8309 Other cholangitis: Secondary | ICD-10-CM

## 2024-05-23 DIAGNOSIS — K760 Fatty (change of) liver, not elsewhere classified: Secondary | ICD-10-CM

## 2024-05-23 MED ORDER — GADOPICLENOL 0.5 MMOL/ML IV SOLN
7.0000 mL | Freq: Once | INTRAVENOUS | Status: AC | PRN
  Administered 2024-05-23: 7 mL via INTRAVENOUS

## 2024-05-28 ENCOUNTER — Other Ambulatory Visit (HOSPITAL_COMMUNITY): Payer: Self-pay

## 2024-09-26 ENCOUNTER — Ambulatory Visit: Admitting: Adult Health
